# Patient Record
Sex: Female | Born: 1935
Health system: Southern US, Community
[De-identification: ages and names within clinical notes are randomized; demographics above are authoritative.]

## PROBLEM LIST (undated history)

## (undated) DIAGNOSIS — I10 Essential (primary) hypertension: Secondary | ICD-10-CM

## (undated) DIAGNOSIS — I4891 Unspecified atrial fibrillation: Secondary | ICD-10-CM

## (undated) DIAGNOSIS — E785 Hyperlipidemia, unspecified: Secondary | ICD-10-CM

## (undated) DIAGNOSIS — N183 Chronic kidney disease, stage 3 unspecified: Secondary | ICD-10-CM

## (undated) DIAGNOSIS — E119 Type 2 diabetes mellitus without complications: Secondary | ICD-10-CM

## (undated) DIAGNOSIS — M353 Polymyalgia rheumatica: Secondary | ICD-10-CM

## (undated) DIAGNOSIS — D6859 Other primary thrombophilia: Secondary | ICD-10-CM

## (undated) DIAGNOSIS — I509 Heart failure, unspecified: Secondary | ICD-10-CM

## (undated) DIAGNOSIS — N189 Chronic kidney disease, unspecified: Secondary | ICD-10-CM

## (undated) DIAGNOSIS — I429 Cardiomyopathy, unspecified: Secondary | ICD-10-CM

## (undated) HISTORY — DX: Essential (primary) hypertension: I10

## (undated) HISTORY — PX: CARDIAC CATHETERIZATION: SHX172

## (undated) HISTORY — PX: BREAST BIOPSY: SHX20

## (undated) HISTORY — DX: Heart failure, unspecified: I50.9

## (undated) HISTORY — DX: Hyperlipidemia, unspecified: E78.5

## (undated) HISTORY — DX: Polymyalgia rheumatica: M35.3

## (undated) HISTORY — DX: Type 2 diabetes mellitus without complications: E11.9

## (undated) HISTORY — DX: Unspecified atrial fibrillation: I48.91

## (undated) HISTORY — DX: Cardiomyopathy, unspecified: I42.9

## (undated) HISTORY — DX: Chronic kidney disease, stage 3 unspecified: N18.30

## (undated) HISTORY — DX: Other primary thrombophilia: D68.59

## (undated) HISTORY — PX: CATARACT EXTRACTION: SUR2

---
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
## 2015-11-23 DIAGNOSIS — I509 Heart failure, unspecified: Secondary | ICD-10-CM | POA: Diagnosis not present

## 2015-11-23 DIAGNOSIS — I2699 Other pulmonary embolism without acute cor pulmonale: Secondary | ICD-10-CM | POA: Diagnosis not present

## 2015-12-06 DIAGNOSIS — M170 Bilateral primary osteoarthritis of knee: Secondary | ICD-10-CM | POA: Diagnosis not present

## 2015-12-06 DIAGNOSIS — M19041 Primary osteoarthritis, right hand: Secondary | ICD-10-CM | POA: Diagnosis not present

## 2015-12-06 DIAGNOSIS — R0789 Other chest pain: Secondary | ICD-10-CM | POA: Diagnosis not present

## 2015-12-06 DIAGNOSIS — M353 Polymyalgia rheumatica: Secondary | ICD-10-CM | POA: Diagnosis not present

## 2015-12-18 DIAGNOSIS — R2689 Other abnormalities of gait and mobility: Secondary | ICD-10-CM | POA: Diagnosis not present

## 2015-12-18 DIAGNOSIS — M6281 Muscle weakness (generalized): Secondary | ICD-10-CM | POA: Diagnosis not present

## 2015-12-18 DIAGNOSIS — M25561 Pain in right knee: Secondary | ICD-10-CM | POA: Diagnosis not present

## 2015-12-20 DIAGNOSIS — R2689 Other abnormalities of gait and mobility: Secondary | ICD-10-CM | POA: Diagnosis not present

## 2015-12-20 DIAGNOSIS — M6281 Muscle weakness (generalized): Secondary | ICD-10-CM | POA: Diagnosis not present

## 2015-12-20 DIAGNOSIS — E119 Type 2 diabetes mellitus without complications: Secondary | ICD-10-CM | POA: Diagnosis not present

## 2015-12-20 DIAGNOSIS — M25561 Pain in right knee: Secondary | ICD-10-CM | POA: Diagnosis not present

## 2015-12-20 DIAGNOSIS — Z961 Presence of intraocular lens: Secondary | ICD-10-CM | POA: Diagnosis not present

## 2015-12-21 DIAGNOSIS — I509 Heart failure, unspecified: Secondary | ICD-10-CM | POA: Diagnosis not present

## 2015-12-21 DIAGNOSIS — I2699 Other pulmonary embolism without acute cor pulmonale: Secondary | ICD-10-CM | POA: Diagnosis not present

## 2015-12-21 DIAGNOSIS — Z6836 Body mass index (BMI) 36.0-36.9, adult: Secondary | ICD-10-CM | POA: Diagnosis not present

## 2015-12-21 DIAGNOSIS — E1165 Type 2 diabetes mellitus with hyperglycemia: Secondary | ICD-10-CM | POA: Diagnosis not present

## 2015-12-21 DIAGNOSIS — Z713 Dietary counseling and surveillance: Secondary | ICD-10-CM | POA: Diagnosis not present

## 2015-12-24 DIAGNOSIS — R2689 Other abnormalities of gait and mobility: Secondary | ICD-10-CM | POA: Diagnosis not present

## 2015-12-24 DIAGNOSIS — M6281 Muscle weakness (generalized): Secondary | ICD-10-CM | POA: Diagnosis not present

## 2015-12-24 DIAGNOSIS — M25561 Pain in right knee: Secondary | ICD-10-CM | POA: Diagnosis not present

## 2015-12-25 DIAGNOSIS — E2839 Other primary ovarian failure: Secondary | ICD-10-CM | POA: Diagnosis not present

## 2015-12-26 DIAGNOSIS — M6281 Muscle weakness (generalized): Secondary | ICD-10-CM | POA: Diagnosis not present

## 2015-12-26 DIAGNOSIS — M25561 Pain in right knee: Secondary | ICD-10-CM | POA: Diagnosis not present

## 2015-12-26 DIAGNOSIS — R2689 Other abnormalities of gait and mobility: Secondary | ICD-10-CM | POA: Diagnosis not present

## 2015-12-27 DIAGNOSIS — M6281 Muscle weakness (generalized): Secondary | ICD-10-CM | POA: Diagnosis not present

## 2015-12-27 DIAGNOSIS — R2689 Other abnormalities of gait and mobility: Secondary | ICD-10-CM | POA: Diagnosis not present

## 2015-12-27 DIAGNOSIS — M25561 Pain in right knee: Secondary | ICD-10-CM | POA: Diagnosis not present

## 2016-01-01 DIAGNOSIS — M6281 Muscle weakness (generalized): Secondary | ICD-10-CM | POA: Diagnosis not present

## 2016-01-01 DIAGNOSIS — R2689 Other abnormalities of gait and mobility: Secondary | ICD-10-CM | POA: Diagnosis not present

## 2016-01-01 DIAGNOSIS — M25561 Pain in right knee: Secondary | ICD-10-CM | POA: Diagnosis not present

## 2016-01-03 DIAGNOSIS — M6281 Muscle weakness (generalized): Secondary | ICD-10-CM | POA: Diagnosis not present

## 2016-01-03 DIAGNOSIS — M25561 Pain in right knee: Secondary | ICD-10-CM | POA: Diagnosis not present

## 2016-01-03 DIAGNOSIS — R2689 Other abnormalities of gait and mobility: Secondary | ICD-10-CM | POA: Diagnosis not present

## 2016-01-04 DIAGNOSIS — M6281 Muscle weakness (generalized): Secondary | ICD-10-CM | POA: Diagnosis not present

## 2016-01-04 DIAGNOSIS — M25561 Pain in right knee: Secondary | ICD-10-CM | POA: Diagnosis not present

## 2016-01-04 DIAGNOSIS — R2689 Other abnormalities of gait and mobility: Secondary | ICD-10-CM | POA: Diagnosis not present

## 2016-01-07 DIAGNOSIS — M6281 Muscle weakness (generalized): Secondary | ICD-10-CM | POA: Diagnosis not present

## 2016-01-07 DIAGNOSIS — M25561 Pain in right knee: Secondary | ICD-10-CM | POA: Diagnosis not present

## 2016-01-07 DIAGNOSIS — R2689 Other abnormalities of gait and mobility: Secondary | ICD-10-CM | POA: Diagnosis not present

## 2016-01-09 DIAGNOSIS — M6281 Muscle weakness (generalized): Secondary | ICD-10-CM | POA: Diagnosis not present

## 2016-01-09 DIAGNOSIS — R2689 Other abnormalities of gait and mobility: Secondary | ICD-10-CM | POA: Diagnosis not present

## 2016-01-09 DIAGNOSIS — M25561 Pain in right knee: Secondary | ICD-10-CM | POA: Diagnosis not present

## 2016-01-11 DIAGNOSIS — M6281 Muscle weakness (generalized): Secondary | ICD-10-CM | POA: Diagnosis not present

## 2016-01-11 DIAGNOSIS — M25561 Pain in right knee: Secondary | ICD-10-CM | POA: Diagnosis not present

## 2016-01-11 DIAGNOSIS — R2689 Other abnormalities of gait and mobility: Secondary | ICD-10-CM | POA: Diagnosis not present

## 2016-01-16 DIAGNOSIS — M25561 Pain in right knee: Secondary | ICD-10-CM | POA: Diagnosis not present

## 2016-01-16 DIAGNOSIS — M6281 Muscle weakness (generalized): Secondary | ICD-10-CM | POA: Diagnosis not present

## 2016-01-16 DIAGNOSIS — R2689 Other abnormalities of gait and mobility: Secondary | ICD-10-CM | POA: Diagnosis not present

## 2016-01-17 DIAGNOSIS — R2689 Other abnormalities of gait and mobility: Secondary | ICD-10-CM | POA: Diagnosis not present

## 2016-01-17 DIAGNOSIS — M25561 Pain in right knee: Secondary | ICD-10-CM | POA: Diagnosis not present

## 2016-01-17 DIAGNOSIS — M6281 Muscle weakness (generalized): Secondary | ICD-10-CM | POA: Diagnosis not present

## 2016-01-18 DIAGNOSIS — M6281 Muscle weakness (generalized): Secondary | ICD-10-CM | POA: Diagnosis not present

## 2016-01-18 DIAGNOSIS — R2689 Other abnormalities of gait and mobility: Secondary | ICD-10-CM | POA: Diagnosis not present

## 2016-01-18 DIAGNOSIS — M25561 Pain in right knee: Secondary | ICD-10-CM | POA: Diagnosis not present

## 2016-01-21 DIAGNOSIS — M6281 Muscle weakness (generalized): Secondary | ICD-10-CM | POA: Diagnosis not present

## 2016-01-21 DIAGNOSIS — M25561 Pain in right knee: Secondary | ICD-10-CM | POA: Diagnosis not present

## 2016-01-21 DIAGNOSIS — R2689 Other abnormalities of gait and mobility: Secondary | ICD-10-CM | POA: Diagnosis not present

## 2016-01-22 DIAGNOSIS — E1165 Type 2 diabetes mellitus with hyperglycemia: Secondary | ICD-10-CM | POA: Diagnosis not present

## 2016-01-22 DIAGNOSIS — I10 Essential (primary) hypertension: Secondary | ICD-10-CM | POA: Diagnosis not present

## 2016-01-22 DIAGNOSIS — I509 Heart failure, unspecified: Secondary | ICD-10-CM | POA: Diagnosis not present

## 2016-01-22 DIAGNOSIS — I2699 Other pulmonary embolism without acute cor pulmonale: Secondary | ICD-10-CM | POA: Diagnosis not present

## 2016-01-23 DIAGNOSIS — M25561 Pain in right knee: Secondary | ICD-10-CM | POA: Diagnosis not present

## 2016-01-23 DIAGNOSIS — M6281 Muscle weakness (generalized): Secondary | ICD-10-CM | POA: Diagnosis not present

## 2016-01-23 DIAGNOSIS — R2689 Other abnormalities of gait and mobility: Secondary | ICD-10-CM | POA: Diagnosis not present

## 2016-01-25 DIAGNOSIS — R2689 Other abnormalities of gait and mobility: Secondary | ICD-10-CM | POA: Diagnosis not present

## 2016-01-25 DIAGNOSIS — M25561 Pain in right knee: Secondary | ICD-10-CM | POA: Diagnosis not present

## 2016-01-25 DIAGNOSIS — M6281 Muscle weakness (generalized): Secondary | ICD-10-CM | POA: Diagnosis not present

## 2016-01-29 DIAGNOSIS — M6281 Muscle weakness (generalized): Secondary | ICD-10-CM | POA: Diagnosis not present

## 2016-01-29 DIAGNOSIS — R2689 Other abnormalities of gait and mobility: Secondary | ICD-10-CM | POA: Diagnosis not present

## 2016-01-29 DIAGNOSIS — M25561 Pain in right knee: Secondary | ICD-10-CM | POA: Diagnosis not present

## 2016-01-31 DIAGNOSIS — R2689 Other abnormalities of gait and mobility: Secondary | ICD-10-CM | POA: Diagnosis not present

## 2016-01-31 DIAGNOSIS — M25561 Pain in right knee: Secondary | ICD-10-CM | POA: Diagnosis not present

## 2016-01-31 DIAGNOSIS — M6281 Muscle weakness (generalized): Secondary | ICD-10-CM | POA: Diagnosis not present

## 2016-02-06 DIAGNOSIS — M6281 Muscle weakness (generalized): Secondary | ICD-10-CM | POA: Diagnosis not present

## 2016-02-06 DIAGNOSIS — M25561 Pain in right knee: Secondary | ICD-10-CM | POA: Diagnosis not present

## 2016-02-06 DIAGNOSIS — R2689 Other abnormalities of gait and mobility: Secondary | ICD-10-CM | POA: Diagnosis not present

## 2016-02-07 DIAGNOSIS — M6281 Muscle weakness (generalized): Secondary | ICD-10-CM | POA: Diagnosis not present

## 2016-02-07 DIAGNOSIS — M25561 Pain in right knee: Secondary | ICD-10-CM | POA: Diagnosis not present

## 2016-02-07 DIAGNOSIS — R2689 Other abnormalities of gait and mobility: Secondary | ICD-10-CM | POA: Diagnosis not present

## 2016-02-12 DIAGNOSIS — R2689 Other abnormalities of gait and mobility: Secondary | ICD-10-CM | POA: Diagnosis not present

## 2016-02-12 DIAGNOSIS — M6281 Muscle weakness (generalized): Secondary | ICD-10-CM | POA: Diagnosis not present

## 2016-02-12 DIAGNOSIS — M25561 Pain in right knee: Secondary | ICD-10-CM | POA: Diagnosis not present

## 2016-02-14 DIAGNOSIS — R2689 Other abnormalities of gait and mobility: Secondary | ICD-10-CM | POA: Diagnosis not present

## 2016-02-14 DIAGNOSIS — M6281 Muscle weakness (generalized): Secondary | ICD-10-CM | POA: Diagnosis not present

## 2016-02-14 DIAGNOSIS — M25561 Pain in right knee: Secondary | ICD-10-CM | POA: Diagnosis not present

## 2016-02-19 DIAGNOSIS — M25561 Pain in right knee: Secondary | ICD-10-CM | POA: Diagnosis not present

## 2016-02-19 DIAGNOSIS — I2699 Other pulmonary embolism without acute cor pulmonale: Secondary | ICD-10-CM | POA: Diagnosis not present

## 2016-02-19 DIAGNOSIS — I509 Heart failure, unspecified: Secondary | ICD-10-CM | POA: Diagnosis not present

## 2016-02-19 DIAGNOSIS — R2689 Other abnormalities of gait and mobility: Secondary | ICD-10-CM | POA: Diagnosis not present

## 2016-02-19 DIAGNOSIS — M6281 Muscle weakness (generalized): Secondary | ICD-10-CM | POA: Diagnosis not present

## 2016-02-21 DIAGNOSIS — R2689 Other abnormalities of gait and mobility: Secondary | ICD-10-CM | POA: Diagnosis not present

## 2016-02-21 DIAGNOSIS — M6281 Muscle weakness (generalized): Secondary | ICD-10-CM | POA: Diagnosis not present

## 2016-02-21 DIAGNOSIS — M25561 Pain in right knee: Secondary | ICD-10-CM | POA: Diagnosis not present

## 2016-02-26 DIAGNOSIS — I340 Nonrheumatic mitral (valve) insufficiency: Secondary | ICD-10-CM | POA: Diagnosis not present

## 2016-02-26 DIAGNOSIS — I5022 Chronic systolic (congestive) heart failure: Secondary | ICD-10-CM | POA: Diagnosis not present

## 2016-02-26 DIAGNOSIS — I482 Chronic atrial fibrillation: Secondary | ICD-10-CM | POA: Diagnosis not present

## 2016-02-27 DIAGNOSIS — M6281 Muscle weakness (generalized): Secondary | ICD-10-CM | POA: Diagnosis not present

## 2016-02-27 DIAGNOSIS — M25561 Pain in right knee: Secondary | ICD-10-CM | POA: Diagnosis not present

## 2016-02-27 DIAGNOSIS — R2689 Other abnormalities of gait and mobility: Secondary | ICD-10-CM | POA: Diagnosis not present

## 2016-02-28 DIAGNOSIS — M25561 Pain in right knee: Secondary | ICD-10-CM | POA: Diagnosis not present

## 2016-02-28 DIAGNOSIS — M6281 Muscle weakness (generalized): Secondary | ICD-10-CM | POA: Diagnosis not present

## 2016-02-28 DIAGNOSIS — R2689 Other abnormalities of gait and mobility: Secondary | ICD-10-CM | POA: Diagnosis not present

## 2016-03-05 DIAGNOSIS — R2689 Other abnormalities of gait and mobility: Secondary | ICD-10-CM | POA: Diagnosis not present

## 2016-03-05 DIAGNOSIS — M6281 Muscle weakness (generalized): Secondary | ICD-10-CM | POA: Diagnosis not present

## 2016-03-05 DIAGNOSIS — M25561 Pain in right knee: Secondary | ICD-10-CM | POA: Diagnosis not present

## 2016-03-06 DIAGNOSIS — R2689 Other abnormalities of gait and mobility: Secondary | ICD-10-CM | POA: Diagnosis not present

## 2016-03-06 DIAGNOSIS — M25561 Pain in right knee: Secondary | ICD-10-CM | POA: Diagnosis not present

## 2016-03-06 DIAGNOSIS — M6281 Muscle weakness (generalized): Secondary | ICD-10-CM | POA: Diagnosis not present

## 2016-03-12 DIAGNOSIS — M25561 Pain in right knee: Secondary | ICD-10-CM | POA: Diagnosis not present

## 2016-03-12 DIAGNOSIS — M170 Bilateral primary osteoarthritis of knee: Secondary | ICD-10-CM | POA: Diagnosis not present

## 2016-03-12 DIAGNOSIS — M6281 Muscle weakness (generalized): Secondary | ICD-10-CM | POA: Diagnosis not present

## 2016-03-12 DIAGNOSIS — R2689 Other abnormalities of gait and mobility: Secondary | ICD-10-CM | POA: Diagnosis not present

## 2016-04-01 DIAGNOSIS — I2699 Other pulmonary embolism without acute cor pulmonale: Secondary | ICD-10-CM | POA: Diagnosis not present

## 2016-04-02 DIAGNOSIS — M170 Bilateral primary osteoarthritis of knee: Secondary | ICD-10-CM | POA: Diagnosis not present

## 2016-04-09 DIAGNOSIS — M170 Bilateral primary osteoarthritis of knee: Secondary | ICD-10-CM | POA: Diagnosis not present

## 2016-04-16 DIAGNOSIS — M170 Bilateral primary osteoarthritis of knee: Secondary | ICD-10-CM | POA: Diagnosis not present

## 2016-04-22 DIAGNOSIS — E119 Type 2 diabetes mellitus without complications: Secondary | ICD-10-CM | POA: Diagnosis not present

## 2016-04-22 DIAGNOSIS — I10 Essential (primary) hypertension: Secondary | ICD-10-CM | POA: Diagnosis not present

## 2016-04-22 DIAGNOSIS — E7800 Pure hypercholesterolemia, unspecified: Secondary | ICD-10-CM | POA: Diagnosis not present

## 2016-04-22 DIAGNOSIS — I509 Heart failure, unspecified: Secondary | ICD-10-CM | POA: Diagnosis not present

## 2016-04-23 DIAGNOSIS — M170 Bilateral primary osteoarthritis of knee: Secondary | ICD-10-CM | POA: Diagnosis not present

## 2016-04-29 DIAGNOSIS — E1165 Type 2 diabetes mellitus with hyperglycemia: Secondary | ICD-10-CM | POA: Diagnosis not present

## 2016-04-29 DIAGNOSIS — I2699 Other pulmonary embolism without acute cor pulmonale: Secondary | ICD-10-CM | POA: Diagnosis not present

## 2016-04-29 DIAGNOSIS — I509 Heart failure, unspecified: Secondary | ICD-10-CM | POA: Diagnosis not present

## 2016-05-12 DIAGNOSIS — I420 Dilated cardiomyopathy: Secondary | ICD-10-CM | POA: Diagnosis not present

## 2016-05-27 DIAGNOSIS — I255 Ischemic cardiomyopathy: Secondary | ICD-10-CM | POA: Diagnosis not present

## 2016-05-27 DIAGNOSIS — Z418 Encounter for other procedures for purposes other than remedying health state: Secondary | ICD-10-CM | POA: Diagnosis not present

## 2016-05-27 DIAGNOSIS — I509 Heart failure, unspecified: Secondary | ICD-10-CM | POA: Diagnosis not present

## 2016-05-27 DIAGNOSIS — I2699 Other pulmonary embolism without acute cor pulmonale: Secondary | ICD-10-CM | POA: Diagnosis not present

## 2016-05-29 ENCOUNTER — Encounter: Payer: Self-pay | Admitting: *Deleted

## 2016-05-29 ENCOUNTER — Other Ambulatory Visit: Payer: Self-pay | Admitting: *Deleted

## 2016-05-30 ENCOUNTER — Encounter: Payer: Self-pay | Admitting: Cardiovascular Disease

## 2016-05-30 ENCOUNTER — Ambulatory Visit (INDEPENDENT_AMBULATORY_CARE_PROVIDER_SITE_OTHER): Payer: Medicare Other | Admitting: Cardiovascular Disease

## 2016-05-30 ENCOUNTER — Encounter: Payer: Self-pay | Admitting: *Deleted

## 2016-05-30 VITALS — BP 142/83 | HR 64 | Ht 60.0 in | Wt 194.0 lb

## 2016-05-30 DIAGNOSIS — I10 Essential (primary) hypertension: Secondary | ICD-10-CM | POA: Diagnosis not present

## 2016-05-30 DIAGNOSIS — Z136 Encounter for screening for cardiovascular disorders: Secondary | ICD-10-CM

## 2016-05-30 DIAGNOSIS — I5022 Chronic systolic (congestive) heart failure: Secondary | ICD-10-CM | POA: Diagnosis not present

## 2016-05-30 DIAGNOSIS — I482 Chronic atrial fibrillation, unspecified: Secondary | ICD-10-CM

## 2016-05-30 DIAGNOSIS — I429 Cardiomyopathy, unspecified: Secondary | ICD-10-CM | POA: Diagnosis not present

## 2016-05-30 DIAGNOSIS — Z86711 Personal history of pulmonary embolism: Secondary | ICD-10-CM

## 2016-05-30 DIAGNOSIS — D6859 Other primary thrombophilia: Secondary | ICD-10-CM

## 2016-05-30 NOTE — Addendum Note (Signed)
Addended by: Lesle Chris on: 05/30/2016 02:16 PM   Modules accepted: Orders

## 2016-05-30 NOTE — Patient Instructions (Signed)
Medication Instructions:   Continue all current medications.  Labwork:  BMET - order given today.  Can do sometime next week.  Office will contact with results via phone or letter.    Testing/Procedures:  Your physician has requested that you have a lexiscan myoview. For further information please visit [email or web address] Please follow instruction sheet, as given.  Office will contact with results via phone or letter.    Follow-Up:  2 months   Any Other Special Instructions Will Be Listed Below (If Applicable).  If you need a refill on your cardiac medications before your next appointment, please call your pharmacy.

## 2016-05-30 NOTE — Progress Notes (Signed)
Patient ID: Rebecca Hanson, female   DOB: [birth year], 82 y.o.   MRN: [id number]       CARDIOLOGY CONSULT NOTE  Patient ID: Rebecca Hanson MRN: [id number] DOB/AGE: Feb 11, 1932 82 y.o.  Admit date: (Not on file) Primary Physician: Ignatius Specking, MD Referring Physician:   Reason for Consultation: a fib, CHF, MR  HPI: The patient is an 82 year old woman with a history of chronic systolic congestive heart failure, chronic atrial fibrillation, and mitral regurgitation. She was previously seen by [facility] on 02/26/16. Echocardiogram performed on 05/14/16 reportedly demonstrated severely reduced left ventricular systolic function, LVEF 30-35%, mild LVH, mild mitral and tricuspid regurgitation, moderate left atrial and mild right atrial enlargement, and mildly elevated pulmonary pressures.  ECG performed in the office today which I personally interpreted demonstrates atrial fibrillation with controlled heart rate and a nonspecific T wave abnormality.  She denies a history of myocardial infarction. She has a history of protein S deficiency and pulmonary embolism. She has venous varicosities. She denies chest pain but does have exertional dyspnea. She denies ever having had a stress test. She says her blood pressure normally runs 120/70, and is 142/83 today.   Allergies  Allergen Reactions  . Benadryl [Diphenhydramine Hcl (Sleep)] Other (See Comments)    "Makes me wild acting"  . Penicillins     rash  . Streptomycin Sulfate [Streptomycin]     rash    Current Outpatient Prescriptions  Medication Sig Dispense Refill  . calcium carbonate (OS-CAL) 600 MG TABS tablet Take 1 tablet by mouth 2 (two) times daily.    . carvedilol (COREG) 12.5 MG tablet Take 1 tablet by mouth 2 (two) times daily.    . cetirizine (ZYRTEC) 10 MG tablet Take 1 tablet by mouth daily.    Marland Kitchen COUMADIN 2.5 MG tablet Take 2 tablets by mouth as directed.  5  . docusate sodium (COLACE) 100 MG capsule Take 1 capsule  by mouth 3 (three) times daily as needed.    . furosemide (LASIX) 40 MG tablet Take 1 tablet by mouth daily.    Marland Kitchen glimepiride (AMARYL) 2 MG tablet Take 1 tablet by mouth daily with breakfast.     . lisinopril (PRINIVIL,ZESTRIL) 10 MG tablet Take 1 tablet by mouth daily.    . metFORMIN (GLUCOPHAGE) 500 MG tablet Take 1,000 mg by mouth 2 (two) times daily.     . Multiple Vitamins-Minerals (MULTIVITAMIN & MINERAL PO) Take 1 tablet by mouth daily.    . potassium chloride (K-DUR) 10 MEQ tablet Take 1 tablet by mouth daily.    Marland Kitchen spironolactone (ALDACTONE) 25 MG tablet Take 0.5 tablets by mouth daily.     No current facility-administered medications for this visit.    Past Medical History  Diagnosis Date  . Diabetes mellitus without complication (HCC)   . CHF (congestive heart failure) (HCC)   . Dyslipidemia     Past Surgical History  Procedure Laterality Date  . Cataract extraction    . Breast biopsy      Social History   Social History  . Marital Status: Widowed    Spouse Name: N/A  . Number of Children: N/A  . Years of Education: N/A   Occupational History  . Not on file.   Social History Main Topics  . Smoking status: Never Smoker   . Smokeless tobacco: Never Used  . Alcohol Use: Not on file  . Drug Use: Not on file  . Sexual Activity: Not on file   Other Topics  Concern  . Not on file   Social History Narrative     No family history of premature CAD in 1st degree relatives.  Prior to Admission medications   Medication Sig Start Date End Date Taking? Authorizing Provider  calcium carbonate (OS-CAL) 600 MG TABS tablet Take 1 tablet by mouth 2 (two) times daily.   Yes Historical Provider, MD  carvedilol (COREG) 12.5 MG tablet Take 1 tablet by mouth 2 (two) times daily. 02/05/15  Yes Historical Provider, MD  cetirizine (ZYRTEC) 10 MG tablet Take 1 tablet by mouth daily.   Yes Historical Provider, MD  COUMADIN 2.5 MG tablet Take 2 tablets by mouth as directed. 04/26/16   Yes Historical Provider, MD  docusate sodium (COLACE) 100 MG capsule Take 1 capsule by mouth 3 (three) times daily as needed.   Yes Historical Provider, MD  furosemide (LASIX) 40 MG tablet Take 1 tablet by mouth daily. 05/24/16  Yes Historical Provider, MD  glimepiride (AMARYL) 2 MG tablet Take 1 tablet by mouth daily with breakfast.  01/29/15  Yes Historical Provider, MD  lisinopril (PRINIVIL,ZESTRIL) 10 MG tablet Take 1 tablet by mouth daily. 02/05/15  Yes Historical Provider, MD  metFORMIN (GLUCOPHAGE) 500 MG tablet Take 1,000 mg by mouth 2 (two) times daily.  04/28/15  Yes Historical Provider, MD  Multiple Vitamins-Minerals (MULTIVITAMIN & MINERAL PO) Take 1 tablet by mouth daily.   Yes Historical Provider, MD  potassium chloride (K-DUR) 10 MEQ tablet Take 1 tablet by mouth daily. 05/24/16  Yes Historical Provider, MD  spironolactone (ALDACTONE) 25 MG tablet Take 0.5 tablets by mouth daily. 02/01/15  Yes Historical Provider, MD     Review of systems complete and found to be negative unless listed above in HPI     Physical exam Blood pressure 142/83, pulse 64, height 5' (1.524 m), weight 194 lb (87.998 kg), SpO2 98 %. General: NAD Neck: No JVD, no thyromegaly or thyroid nodule.  Lungs: Clear to auscultation bilaterally with normal respiratory effort. CV: Nondisplaced PMI. Regular rate and irregular rhythm, normal S1/S2, no S3, no murmur.  1+ pitting pretibial and periankle edema.  Wearing right knee brace. No carotid bruit. Abdomen: Soft, nontender,obese. Skin: Intact without lesions or rashes.  Neurologic: Alert and oriented x 3.  Psych: Normal affect. Extremities: No clubbing or cyanosis.  HEENT: Normal.   ECG: Most recent ECG reviewed.  Labs:  No results found for: WBC, HGB, HCT, MCV, PLT No results for input(s): NA, K, CL, CO2, BUN, CREATININE, CALCIUM, PROT, BILITOT, ALKPHOS, ALT, AST, GLUCOSE in the last 168 hours.  Invalid input(s): LABALBU No results found for: CKTOTAL, CKMB,  CKMBINDEX, TROPONINI No results found for: CHOL No results found for: HDL No results found for: LDLCALC No results found for: TRIG No results found for: CHOLHDL No results found for: LDLDIRECT       Studies: No results found.  ASSESSMENT AND PLAN:  1. Chronic systolic heart failure: Currently on carvedilol 12.5 mg twice daily, Lasix 40 mg daily, lisinopril 10 mg daily, and spironolactone 12.5 mg daily. I will proceed with a nuclear myocardial perfusion imaging study to evaluate for ischemic heart disease (Lexiscan). Will check BMET as I may switch ACEI to Entresto.  2. Chronic atrial fibrillation: Anticoagulated with warfarin. HR controlled with Coreg.  3. Essential HTN: Mildly elevated. Will monitor.   4. Pulmonary embolism/protein S deficiency: Continue warfarin.  Dispo: fu 2 months.   Signed: Prentice Docker, M.D., F.A.C.C.  05/30/2016, 1:49 PM

## 2016-06-04 DIAGNOSIS — I5022 Chronic systolic (congestive) heart failure: Secondary | ICD-10-CM | POA: Diagnosis not present

## 2016-06-05 DIAGNOSIS — M353 Polymyalgia rheumatica: Secondary | ICD-10-CM | POA: Diagnosis not present

## 2016-06-05 DIAGNOSIS — M174 Other bilateral secondary osteoarthritis of knee: Secondary | ICD-10-CM | POA: Diagnosis not present

## 2016-06-05 DIAGNOSIS — M19241 Secondary osteoarthritis, right hand: Secondary | ICD-10-CM | POA: Diagnosis not present

## 2016-06-10 ENCOUNTER — Telehealth: Payer: Self-pay | Admitting: *Deleted

## 2016-06-10 NOTE — Telephone Encounter (Signed)
-----   Message from Suresh A Koneswaran, MD sent at 06/10/2016  1:57 PM EDT ----- Ok. 

## 2016-06-10 NOTE — Telephone Encounter (Signed)
Called patient with test results. No answer. Left message to call back.  

## 2016-06-11 ENCOUNTER — Ambulatory Visit (HOSPITAL_COMMUNITY): Admission: RE | Admit: 2016-06-11 | Payer: Medicare Other | Source: Ambulatory Visit

## 2016-06-11 ENCOUNTER — Telehealth: Payer: Self-pay | Admitting: *Deleted

## 2016-06-11 ENCOUNTER — Encounter (HOSPITAL_COMMUNITY)
Admission: RE | Admit: 2016-06-11 | Discharge: 2016-06-11 | Disposition: A | Discharge status: Home / Self Care | Payer: Medicare Other | Source: Ambulatory Visit | Attending: Cardiovascular Disease | Admitting: Cardiovascular Disease

## 2016-06-11 ENCOUNTER — Encounter (HOSPITAL_COMMUNITY): Payer: Self-pay

## 2016-06-11 DIAGNOSIS — R931 Abnormal findings on diagnostic imaging of heart and coronary circulation: Secondary | ICD-10-CM | POA: Diagnosis not present

## 2016-06-11 DIAGNOSIS — I4891 Unspecified atrial fibrillation: Secondary | ICD-10-CM | POA: Insufficient documentation

## 2016-06-11 DIAGNOSIS — I429 Cardiomyopathy, unspecified: Secondary | ICD-10-CM | POA: Diagnosis not present

## 2016-06-11 DIAGNOSIS — I510 Cardiac septal defect, acquired: Secondary | ICD-10-CM | POA: Diagnosis not present

## 2016-06-11 LAB — NM MYOCAR MULTI W/SPECT W/WALL MOTION / EF
CHL CUP NUCLEAR SRS: 1
CHL CUP NUCLEAR SSS: 2
CHL CUP RESTING HR STRESS: 94 {beats}/min
CSEPHR: 79 %
LV dias vol: 98 mL (ref 46–106)
LVSYSVOL: 69 mL
MPHR: 138 {beats}/min
NUC STRESS TID: 1.04
Peak HR: 110 {beats}/min
RATE: 0.31
SDS: 1

## 2016-06-11 MED ORDER — TECHNETIUM TC 99M TETROFOSMIN IV KIT
30.0000 | PACK | Freq: Once | INTRAVENOUS | Status: AC | PRN
  Administered 2016-06-11: 30 via INTRAVENOUS

## 2016-06-11 MED ORDER — REGADENOSON 0.4 MG/5ML IV SOLN
INTRAVENOUS | Status: AC
  Administered 2016-06-11: 0.4 mg
  Filled 2016-06-11: qty 5

## 2016-06-11 MED ORDER — TECHNETIUM TC 99M TETROFOSMIN IV KIT
10.0000 | PACK | Freq: Once | INTRAVENOUS | Status: AC | PRN
  Administered 2016-06-11: 11 via INTRAVENOUS

## 2016-06-11 NOTE — Telephone Encounter (Signed)
Called patient with test results. No answer. Left message to call back.  

## 2016-06-11 NOTE — Telephone Encounter (Signed)
-----   Message from Laqueta Linden, MD sent at 06/10/2016  1:57 PM EDT ----- [place].

## 2016-06-17 ENCOUNTER — Telehealth: Payer: Self-pay | Admitting: *Deleted

## 2016-06-17 NOTE — Telephone Encounter (Signed)
-----   Message from Laqueta Linden, MD sent at 06/12/2016  8:37 AM EDT ----- Continued medical therapy.

## 2016-06-17 NOTE — Telephone Encounter (Signed)
Notes Recorded by Lesle Chris, LPN on 02/15/1858 at 3:36 PM EDT Left message to return call.

## 2016-06-18 NOTE — Telephone Encounter (Signed)
Notes Recorded by Lesle Chris, LPN on 12/17/5168 at 10:26 AM EDT Patient notified. Copy to pmd. Follow up scheduled for September.

## 2016-06-24 DIAGNOSIS — I2699 Other pulmonary embolism without acute cor pulmonale: Secondary | ICD-10-CM | POA: Diagnosis not present

## 2016-06-24 DIAGNOSIS — E1165 Type 2 diabetes mellitus with hyperglycemia: Secondary | ICD-10-CM | POA: Diagnosis not present

## 2016-06-24 DIAGNOSIS — Z299 Encounter for prophylactic measures, unspecified: Secondary | ICD-10-CM | POA: Diagnosis not present

## 2016-06-24 DIAGNOSIS — I509 Heart failure, unspecified: Secondary | ICD-10-CM | POA: Diagnosis not present

## 2016-07-25 ENCOUNTER — Encounter: Payer: Self-pay | Admitting: Cardiovascular Disease

## 2016-07-25 ENCOUNTER — Ambulatory Visit (INDEPENDENT_AMBULATORY_CARE_PROVIDER_SITE_OTHER): Payer: Medicare Other | Admitting: Cardiovascular Disease

## 2016-07-25 VITALS — BP 134/88 | HR 82 | Ht 60.0 in | Wt 194.8 lb

## 2016-07-25 DIAGNOSIS — I5022 Chronic systolic (congestive) heart failure: Secondary | ICD-10-CM | POA: Diagnosis not present

## 2016-07-25 DIAGNOSIS — I10 Essential (primary) hypertension: Secondary | ICD-10-CM

## 2016-07-25 DIAGNOSIS — Z23 Encounter for immunization: Secondary | ICD-10-CM | POA: Diagnosis not present

## 2016-07-25 DIAGNOSIS — I429 Cardiomyopathy, unspecified: Secondary | ICD-10-CM | POA: Diagnosis not present

## 2016-07-25 DIAGNOSIS — Z86711 Personal history of pulmonary embolism: Secondary | ICD-10-CM

## 2016-07-25 DIAGNOSIS — I482 Chronic atrial fibrillation, unspecified: Secondary | ICD-10-CM

## 2016-07-25 DIAGNOSIS — D6859 Other primary thrombophilia: Secondary | ICD-10-CM | POA: Diagnosis not present

## 2016-07-25 MED ORDER — SACUBITRIL-VALSARTAN 24-26 MG PO TABS: 1.0000 | ORAL_TABLET | Freq: Two times a day (BID) | ORAL | 6 refills | Status: DC

## 2016-07-25 MED ORDER — SACUBITRIL-VALSARTAN 24-26 MG PO TABS: 1.0000 | ORAL_TABLET | Freq: Two times a day (BID) | ORAL | 0 refills | Status: DC

## 2016-07-25 NOTE — Patient Instructions (Signed)
Medication Instructions:   Stop Lisinopril.  Begin Entresto 24/26mg  twice a day - samples, printed scripts, & 1 month free trial offer given today. - PLEASE BEGIN THIS ON Sunday, July 27, 2016.    Continue all other medications.    Labwork: none  Testing/Procedures: none  Follow-Up: 4 months   Any Other Special Instructions Will Be Listed Below (If Applicable).  If you need a refill on your cardiac medications before your next appointment, please call your pharmacy.

## 2016-07-25 NOTE — Progress Notes (Signed)
SUBJECTIVE: The patient is an 82 year old woman with a history of chronic systolic congestive heart failure, chronic atrial fibrillation, and mitral regurgitation.   Echocardiogram performed on 05/14/16 reportedly demonstrated severely reduced left ventricular systolic function, LVEF 30-35%, mild LVH, mild mitral and tricuspid regurgitation, moderate left atrial and mild right atrial enlargement, and mildly elevated pulmonary pressures.  She denies a history of myocardial infarction. She has a history of protein S deficiency and pulmonary embolism. She has venous varicosities. She denies chest pain but does have exertional dyspnea which is stable.   Nuclear stress test 06/11/16 did not show any large ischemic territories. There was a small, mild intensity, apical anterior defect that was partially reversible and thought related to attenuation artifact versus a minor region of ischemia.  Labs 06/04/16 BUN 26, creatinine 1.16.   Review of Systems: As per "subjective", otherwise negative.  Allergies  Allergen Reactions  . Benadryl [Diphenhydramine Hcl (Sleep)] Other (See Comments)    "Makes me wild acting"  . Penicillins     rash  . Streptomycin Sulfate [Streptomycin]     rash    Current Outpatient Prescriptions  Medication Sig Dispense Refill  . calcium carbonate (OS-CAL) 600 MG TABS tablet Take 1 tablet by mouth 2 (two) times daily.    . carvedilol (COREG) 12.5 MG tablet Take 1 tablet by mouth 2 (two) times daily.    . cetirizine (ZYRTEC) 10 MG tablet Take 1 tablet by mouth daily.    Marland Kitchen COUMADIN 2.5 MG tablet Take 2 tablets by mouth as directed.  5  . docusate sodium (COLACE) 100 MG capsule Take 1 capsule by mouth 3 (three) times daily as needed.    . furosemide (LASIX) 40 MG tablet Take 1 tablet by mouth daily.    Marland Kitchen glimepiride (AMARYL) 2 MG tablet Take 1 tablet by mouth daily with breakfast.     . lisinopril (PRINIVIL,ZESTRIL) 10 MG tablet Take 1 tablet by mouth daily.    .  metFORMIN (GLUCOPHAGE) 500 MG tablet Take 1,000 mg by mouth 2 (two) times daily.     . Multiple Vitamins-Minerals (MULTIVITAMIN & MINERAL PO) Take 1 tablet by mouth daily.    . potassium chloride (K-DUR) 10 MEQ tablet Take 1 tablet by mouth daily.    Marland Kitchen spironolactone (ALDACTONE) 25 MG tablet Take 0.5 tablets by mouth daily.    . potassium chloride (K-DUR,KLOR-CON) 10 MEQ tablet Take 10 mEq by mouth daily.     No current facility-administered medications for this visit.     Past Medical History:  Diagnosis Date  . CHF (congestive heart failure) (HCC)   . Diabetes mellitus without complication (HCC)   . Dyslipidemia     Past Surgical History:  Procedure Laterality Date  . BREAST BIOPSY    . CATARACT EXTRACTION      Social History   Social History  . Marital status: Widowed    Spouse name: N/A  . Number of children: N/A  . Years of education: N/A   Occupational History  . Not on file.   Social History Main Topics  . Smoking status: Never Smoker  . Smokeless tobacco: Never Used  . Alcohol use Not on file  . Drug use: Unknown  . Sexual activity: Not on file   Other Topics Concern  . Not on file   Social History Narrative  . No narrative on file     Vitals:   07/25/16 1300  BP: 134/88  Pulse: 82  Weight: 194  lb 12.8 oz (88.4 kg)  Height: 5' (1.524 m)    PHYSICAL EXAM General: NAD Neck: No JVD, no thyromegaly or thyroid nodule.  Lungs: Clear to auscultation bilaterally with normal respiratory effort. CV: Nondisplaced PMI. Regular rate and irregular rhythm, normal S1/S2, no S3, no murmur.  1+ pitting pretibial and periankle edema.  Wearing right knee brace.  Abdomen: Soft, nontender,obese. Skin: Intact without lesions or rashes.  Neurologic: Alert and oriented x 3.  Psych: Normal affect. Extremities: No clubbing or cyanosis.  HEENT: Normal.     ECG: Most recent ECG reviewed.      ASSESSMENT AND PLAN: 1. Chronic systolic heart failure: NYHA class II  symptoms. Currently on carvedilol 12.5 mg twice daily, Lasix 40 mg daily, lisinopril 10 mg daily, and spironolactone 12.5 mg daily. I will switch ACEI to [facility].  2. Chronic atrial fibrillation: Anticoagulated with warfarin. HR controlled with Coreg.  3. Essential HTN: Controlled. Will monitor.   4. Pulmonary embolism/protein S deficiency: Continue warfarin.  Dispo: fu 4 months.   Prentice Docker, M.D., F.A.C.C.

## 2016-08-06 DIAGNOSIS — E1165 Type 2 diabetes mellitus with hyperglycemia: Secondary | ICD-10-CM | POA: Diagnosis not present

## 2016-08-06 DIAGNOSIS — I509 Heart failure, unspecified: Secondary | ICD-10-CM | POA: Diagnosis not present

## 2016-08-06 DIAGNOSIS — I2699 Other pulmonary embolism without acute cor pulmonale: Secondary | ICD-10-CM | POA: Diagnosis not present

## 2016-08-06 DIAGNOSIS — R21 Rash and other nonspecific skin eruption: Secondary | ICD-10-CM | POA: Diagnosis not present

## 2016-08-18 ENCOUNTER — Other Ambulatory Visit: Payer: Self-pay | Admitting: Cardiovascular Disease

## 2016-09-03 DIAGNOSIS — Z299 Encounter for prophylactic measures, unspecified: Secondary | ICD-10-CM | POA: Diagnosis not present

## 2016-09-03 DIAGNOSIS — I2699 Other pulmonary embolism without acute cor pulmonale: Secondary | ICD-10-CM | POA: Diagnosis not present

## 2016-09-03 DIAGNOSIS — Z713 Dietary counseling and surveillance: Secondary | ICD-10-CM | POA: Diagnosis not present

## 2016-09-03 DIAGNOSIS — Z6836 Body mass index (BMI) 36.0-36.9, adult: Secondary | ICD-10-CM | POA: Diagnosis not present

## 2016-09-30 DIAGNOSIS — Z6837 Body mass index (BMI) 37.0-37.9, adult: Secondary | ICD-10-CM | POA: Diagnosis not present

## 2016-09-30 DIAGNOSIS — Z713 Dietary counseling and surveillance: Secondary | ICD-10-CM | POA: Diagnosis not present

## 2016-09-30 DIAGNOSIS — Z299 Encounter for prophylactic measures, unspecified: Secondary | ICD-10-CM | POA: Diagnosis not present

## 2016-09-30 DIAGNOSIS — I2699 Other pulmonary embolism without acute cor pulmonale: Secondary | ICD-10-CM | POA: Diagnosis not present

## 2016-10-27 DIAGNOSIS — Z1211 Encounter for screening for malignant neoplasm of colon: Secondary | ICD-10-CM | POA: Diagnosis not present

## 2016-10-27 DIAGNOSIS — Z299 Encounter for prophylactic measures, unspecified: Secondary | ICD-10-CM | POA: Diagnosis not present

## 2016-10-27 DIAGNOSIS — Z789 Other specified health status: Secondary | ICD-10-CM | POA: Diagnosis not present

## 2016-10-27 DIAGNOSIS — Z0000 Encounter for general adult medical examination without abnormal findings: Secondary | ICD-10-CM | POA: Diagnosis not present

## 2016-10-27 DIAGNOSIS — Z79899 Other long term (current) drug therapy: Secondary | ICD-10-CM | POA: Diagnosis not present

## 2016-10-27 DIAGNOSIS — Z7189 Other specified counseling: Secondary | ICD-10-CM | POA: Diagnosis not present

## 2016-10-27 DIAGNOSIS — E7800 Pure hypercholesterolemia, unspecified: Secondary | ICD-10-CM | POA: Diagnosis not present

## 2016-10-27 DIAGNOSIS — Z1389 Encounter for screening for other disorder: Secondary | ICD-10-CM | POA: Diagnosis not present

## 2016-10-27 DIAGNOSIS — I2699 Other pulmonary embolism without acute cor pulmonale: Secondary | ICD-10-CM | POA: Diagnosis not present

## 2016-10-28 DIAGNOSIS — R5383 Other fatigue: Secondary | ICD-10-CM | POA: Diagnosis not present

## 2016-10-28 DIAGNOSIS — E7800 Pure hypercholesterolemia, unspecified: Secondary | ICD-10-CM | POA: Diagnosis not present

## 2016-10-28 DIAGNOSIS — Z79899 Other long term (current) drug therapy: Secondary | ICD-10-CM | POA: Diagnosis not present

## 2016-10-31 DIAGNOSIS — E7800 Pure hypercholesterolemia, unspecified: Secondary | ICD-10-CM | POA: Diagnosis not present

## 2016-10-31 DIAGNOSIS — I509 Heart failure, unspecified: Secondary | ICD-10-CM | POA: Diagnosis not present

## 2016-10-31 DIAGNOSIS — I10 Essential (primary) hypertension: Secondary | ICD-10-CM | POA: Diagnosis not present

## 2016-10-31 DIAGNOSIS — E119 Type 2 diabetes mellitus without complications: Secondary | ICD-10-CM | POA: Diagnosis not present

## 2016-11-19 DIAGNOSIS — E119 Type 2 diabetes mellitus without complications: Secondary | ICD-10-CM | POA: Insufficient documentation

## 2016-11-19 DIAGNOSIS — M19042 Primary osteoarthritis, left hand: Secondary | ICD-10-CM

## 2016-11-19 DIAGNOSIS — M170 Bilateral primary osteoarthritis of knee: Secondary | ICD-10-CM | POA: Insufficient documentation

## 2016-11-19 DIAGNOSIS — Z86711 Personal history of pulmonary embolism: Secondary | ICD-10-CM | POA: Insufficient documentation

## 2016-11-19 DIAGNOSIS — Z86718 Personal history of other venous thrombosis and embolism: Secondary | ICD-10-CM | POA: Insufficient documentation

## 2016-11-19 DIAGNOSIS — M353 Polymyalgia rheumatica: Secondary | ICD-10-CM | POA: Insufficient documentation

## 2016-11-19 DIAGNOSIS — M1120 Other chondrocalcinosis, unspecified site: Secondary | ICD-10-CM | POA: Insufficient documentation

## 2016-11-19 DIAGNOSIS — Z8679 Personal history of other diseases of the circulatory system: Secondary | ICD-10-CM | POA: Insufficient documentation

## 2016-11-19 DIAGNOSIS — I10 Essential (primary) hypertension: Secondary | ICD-10-CM | POA: Insufficient documentation

## 2016-11-19 DIAGNOSIS — M19041 Primary osteoarthritis, right hand: Secondary | ICD-10-CM | POA: Insufficient documentation

## 2016-11-19 NOTE — Progress Notes (Addendum)
Office Visit Note  Patient: Rebecca Hanson             Date of Birth: August 13, 1932           MRN: [id number]             PCP: Ignatius Specking, MD Referring: Ignatius Specking, MD Visit Date: 11/20/2016 Occupation: @GUAROCC @    Subjective:  Right knee pain   History of Present Illness: Rebecca Hanson is a 83 y.o. female with history of polymyalgia rheumatica, osteoarthritis and pseudogout. She states she's been having pain in her right knee joint constantly. The pain has been worse in the last month. She denies any joint swelling. She also complains of pain in her bilateral shoulders . her hands are stiff in the morning.  Activities of Daily Living:  Patient reports morning stiffness for 1 hour.   Patient Denies nocturnal pain.  Difficulty dressing/grooming: Denies Difficulty climbing stairs: Reports Difficulty getting out of chair: Reports Difficulty using hands for taps, buttons, cutlery, and/or writing: Denies   Review of Systems  Constitutional: Positive for fatigue. Negative for night sweats, weight gain, weight loss and weakness.  HENT: Negative for mouth sores, trouble swallowing, trouble swallowing, mouth dryness and nose dryness.   Eyes: Negative for pain, redness, visual disturbance and dryness.  Respiratory: Negative for cough, shortness of breath and difficulty breathing.   Cardiovascular: Negative for chest pain, palpitations, hypertension, irregular heartbeat and swelling in legs/feet.  Gastrointestinal: Negative for blood in stool, constipation and diarrhea.  Endocrine: Negative for increased urination.  Genitourinary: Negative for vaginal dryness.  Musculoskeletal: Positive for arthralgias, joint pain, myalgias, morning stiffness and myalgias. Negative for joint swelling, muscle weakness and muscle tenderness.  Skin: Negative for color change, rash, hair loss, skin tightness, ulcers and sensitivity to sunlight.  Allergic/Immunologic: Negative for susceptible to  infections.  Neurological: Negative for dizziness, memory loss and night sweats.  Hematological: Negative for swollen glands.  Psychiatric/Behavioral: Negative for depressed mood and sleep disturbance. The patient is not nervous/anxious.     PMFS History:  Patient Active Problem List   Diagnosis Date Noted  . Polymyalgia rheumatica (HCC) 11/19/2016  . Primary osteoarthritis of both knees 11/19/2016  . Primary osteoarthritis of both hands 11/19/2016  . Pseudogout 11/19/2016  . Essential hypertension 11/19/2016  . Diabetes mellitus 11/19/2016  . History of DVT (deep vein thrombosis) 11/19/2016  . History of pulmonary embolism 11/19/2016  . History of congestive heart failure 11/19/2016    Past Medical History:  Diagnosis Date  . CHF (congestive heart failure) (HCC)   . Diabetes mellitus without complication (HCC)   . Dyslipidemia     Family History  Problem Relation Age of Onset  . Heart disease Father   . CAD Father   . CVA Father    Past Surgical History:  Procedure Laterality Date  . BREAST BIOPSY    . CATARACT EXTRACTION     Social History   Social History Narrative  . No narrative on file     Objective: Vital Signs: BP 136/82   Pulse 76   Resp 14   Ht 5' (1.524 m)   Wt 196 lb (88.9 kg)   BMI 38.28 kg/m    Physical Exam  Constitutional: She is oriented to person, place, and time. She appears well-developed and well-nourished.  HENT:  Head: Normocephalic and atraumatic.  Eyes: Conjunctivae and EOM are normal.  Neck: Normal range of motion.  Cardiovascular: Normal rate, regular rhythm, normal heart sounds and  intact distal pulses.   Pulmonary/Chest: Effort normal and breath sounds normal.  Abdominal: Soft. Bowel sounds are normal.  Musculoskeletal: She exhibits edema.  Bilateral LEs  Lymphadenopathy:    She has no cervical adenopathy.  Neurological: She is alert and oriented to person, place, and time.  Skin: Skin is warm and dry. Capillary refill  takes less than 2 seconds.  Psychiatric: She has a normal mood and affect. Her behavior is normal.  Nursing note and vitals reviewed.    Musculoskeletal Exam: C-spine and thoracic lumbar spine limited range of motion with not much discomfort. Shoulder joint abduction limited to 90 bilaterally with painful range of motion of the right shoulder. Elbow joints wrist joints MCPs PIPs DIPs with good range of motion she has DIP thickening consistent with osteoarthritis. No synovitis was noted. Hip joints knee joints ankles MTPs with good range of motion. She has discomfort in her right knee joint which she is using a brace for her. There was no warmth swelling or effusion in the right knee.  CDAI Exam: No CDAI exam completed.    Investigation: No additional findings.   Imaging: No results found.  Speciality Comments: No specialty comments available.    Procedures:  No procedures performed Allergies: Benadryl [diphenhydramine hcl (sleep)]; Penicillins; and Streptomycin sulfate [streptomycin]   Assessment / Plan:     Visit Diagnoses: Polymyalgia rheumatica (HCC): She appears to be in remission without any muscle weakness or tenderness on examination today.  Primary osteoarthritis of both knees: She's been having discomfort in her right knee joint for which she's been using a brace. We decided not to inject her has her sugars have been running high and she is diabetic.  Primary osteoarthritis of both hands: She continues to have some stiffness in her hands. Joint protection and muscle strengthening was discussed.  Pseudogout: She has not had a flare of her pseudogout.  Right shoulder joint pain: She is having difficulty lifting her right arm. I've given her handout on exercises. Can avoid injection of her right shoulder.  Essential hypertension  Diabetes mellitus  History of DVT (deep vein thrombosis)  History of pulmonary embolism - 2002  History of congestive heart failure -  2016    Orders: No orders of the defined types were placed in this encounter.  No orders of the defined types were placed in this encounter.   Face-to-face time spent with patient was 30 minutes. 50% of time was spent in counseling and coordination of care.  Follow-Up Instructions: Return in about 6 months (around 05/20/2017) for Osteoarthritis.   Pollyann Savoy, MD  Note - This record has been created using Animal nutritionist.  Chart creation errors have been sought, but may not always  have been located. Such creation errors do not reflect on  the standard of medical care.

## 2016-11-20 ENCOUNTER — Encounter: Payer: Self-pay | Admitting: Rheumatology

## 2016-11-20 ENCOUNTER — Ambulatory Visit (INDEPENDENT_AMBULATORY_CARE_PROVIDER_SITE_OTHER): Payer: Medicare Other | Admitting: Rheumatology

## 2016-11-20 VITALS — BP 136/82 | HR 76 | Resp 14 | Ht 60.0 in | Wt 196.0 lb

## 2016-11-20 DIAGNOSIS — Z8679 Personal history of other diseases of the circulatory system: Secondary | ICD-10-CM

## 2016-11-20 DIAGNOSIS — M170 Bilateral primary osteoarthritis of knee: Secondary | ICD-10-CM | POA: Diagnosis not present

## 2016-11-20 DIAGNOSIS — M19042 Primary osteoarthritis, left hand: Secondary | ICD-10-CM | POA: Diagnosis not present

## 2016-11-20 DIAGNOSIS — Z86711 Personal history of pulmonary embolism: Secondary | ICD-10-CM

## 2016-11-20 DIAGNOSIS — E119 Type 2 diabetes mellitus without complications: Secondary | ICD-10-CM

## 2016-11-20 DIAGNOSIS — I10 Essential (primary) hypertension: Secondary | ICD-10-CM

## 2016-11-20 DIAGNOSIS — M1120 Other chondrocalcinosis, unspecified site: Secondary | ICD-10-CM

## 2016-11-20 DIAGNOSIS — M19041 Primary osteoarthritis, right hand: Secondary | ICD-10-CM | POA: Diagnosis not present

## 2016-11-20 DIAGNOSIS — Z86718 Personal history of other venous thrombosis and embolism: Secondary | ICD-10-CM | POA: Diagnosis not present

## 2016-11-20 DIAGNOSIS — M353 Polymyalgia rheumatica: Secondary | ICD-10-CM | POA: Diagnosis not present

## 2016-11-20 NOTE — Patient Instructions (Signed)
Knee Exercises Ask your health care provider which exercises are safe for you. Do exercises exactly as told by your health care provider and adjust them as directed. It is normal to feel mild stretching, pulling, tightness, or discomfort as you do these exercises, but you should stop right away if you feel sudden pain or your pain gets worse.Do not begin these exercises until told by your health care provider. STRETCHING AND RANGE OF MOTION EXERCISES  These exercises warm up your muscles and joints and improve the movement and flexibility of your knee. These exercises also help to relieve pain, numbness, and tingling. Exercise A: Knee Extension, Prone 1. Lie on your abdomen on a bed. 2. Place your left / right knee just beyond the edge of the surface so your knee is not on the bed. You can put a towel under your left / right thigh just above your knee for comfort. 3. Relax your leg muscles and allow gravity to straighten your knee. You should feel a stretch behind your left / right knee. 4. Hold this position for __________ seconds. 5. Scoot up so your knee is supported between repetitions. Repeat __________ times. Complete this stretch __________ times a day. Exercise B: Knee Flexion, Active  1. Lie on your back with both knees straight. If this causes back discomfort, bend your left / right knee so your foot is flat on the floor. 2. Slowly slide your left / right heel back toward your buttocks until you feel a gentle stretch in the front of your knee or thigh. 3. Hold this position for __________ seconds. 4. Slowly slide your left / right heel back to the starting position. Repeat __________ times. Complete this exercise __________ times a day. Exercise C: Quadriceps, Prone  1. Lie on your abdomen on a firm surface, such as a bed or padded floor. 2. Bend your left / right knee and hold your ankle. If you cannot reach your ankle or pant leg, loop a belt around your foot and grab the belt  instead. 3. Gently pull your heel toward your buttocks. Your knee should not slide out to the side. You should feel a stretch in the front of your thigh and knee. 4. Hold this position for __________ seconds. Repeat __________ times. Complete this stretch __________ times a day. Exercise D: Hamstring, Supine 1. Lie on your back. 2. Loop a belt or towel over the ball of your left / right foot. The ball of your foot is on the walking surface, right under your toes. 3. Straighten your left / right knee and slowly pull on the belt to raise your leg until you feel a gentle stretch behind your knee.  Do not let your left / right knee bend while you do this.  Keep your other leg flat on the floor. 4. Hold this position for __________ seconds. Repeat __________ times. Complete this stretch __________ times a day. STRENGTHENING EXERCISES  These exercises build strength and endurance in your knee. Endurance is the ability to use your muscles for a long time, even after they get tired. Exercise E: Quadriceps, Isometric  1. Lie on your back with your left / right leg extended and your other knee bent. Put a rolled towel or small pillow under your knee if told by your health care provider. 2. Slowly tense the muscles in the front of your left / right thigh. You should see your kneecap slide up toward your hip or see increased dimpling just above the   knee. This motion will push the back of the knee toward the floor. 3. For __________ seconds, keep the muscle as tight as you can without increasing your pain. 4. Relax the muscles slowly and completely. Repeat __________ times. Complete this exercise __________ times a day. Exercise F: Straight Leg Raises - Quadriceps 1. Lie on your back with your left / right leg extended and your other knee bent. 2. Tense the muscles in the front of your left / right thigh. You should see your kneecap slide up or see increased dimpling just above the knee. Your thigh may  even shake a bit. 3. Keep these muscles tight as you raise your leg 4-6 inches (10-15 cm) off the floor. Do not let your knee bend. 4. Hold this position for __________ seconds. 5. Keep these muscles tense as you lower your leg. 6. Relax your muscles slowly and completely after each repetition. Repeat __________ times. Complete this exercise __________ times a day. Exercise G: Hamstring, Isometric 1. Lie on your back on a firm surface. 2. Bend your left / right knee approximately __________ degrees. 3. Dig your left / right heel into the surface as if you are trying to pull it toward your buttocks. Tighten the muscles in the back of your thighs to dig as hard as you can without increasing any pain. 4. Hold this position for __________ seconds. 5. Release the tension gradually and allow your muscles to relax completely for __________ seconds after each repetition. Repeat __________ times. Complete this exercise __________ times a day. Exercise H: Hamstring Curls  If told by your health care provider, do this exercise while wearing ankle weights. Begin with __________ weights. Then increase the weight by 1 lb (0.5 kg) increments. Do not wear ankle weights that are more than __________. 1. Lie on your abdomen with your legs straight. 2. Bend your left / right knee as far as you can without feeling pain. Keep your hips flat against the floor. 3. Hold this position for __________ seconds. 4. Slowly lower your leg to the starting position. Repeat __________ times. Complete this exercise __________ times a day. Exercise I: Squats (Quadriceps) 1. Stand in front of a table, with your feet and knees pointing straight ahead. You may rest your hands on the table for balance but not for support. 2. Slowly bend your knees and lower your hips like you are going to sit in a chair.  Keep your weight over your heels, not over your toes.  Keep your lower legs upright so they are parallel with the table  legs.  Do not let your hips go lower than your knees.  Do not bend lower than told by your health care provider.  If your knee pain increases, do not bend as low. 3. Hold the squat position for __________ seconds. 4. Slowly push with your legs to return to standing. Do not use your hands to pull yourself to standing. Repeat __________ times. Complete this exercise __________ times a day. Exercise J: Wall Slides (Quadriceps)  1. Lean your back against a smooth wall or door while you walk your feet out 18-24 inches (46-61 cm) from it. 2. Place your feet hip-width apart. 3. Slowly slide down the wall or door until your knees bend __________ degrees. Keep your knees over your heels, not over your toes. Keep your knees in line with your hips. 4. Hold for __________ seconds. Repeat __________ times. Complete this exercise __________ times a day. Exercise K: Straight Leg Raises -   Hip Abductors 1. Lie on your side with your left / right leg in the top position. Lie so your head, shoulder, knee, and hip line up. You may bend your bottom knee to help you keep your balance. 2. Roll your hips slightly forward so your hips are stacked directly over each other and your left / right knee is facing forward. 3. Leading with your heel, lift your top leg 4-6 inches (10-15 cm). You should feel the muscles in your outer hip lifting.  Do not let your foot drift forward.  Do not let your knee roll toward the ceiling. 4. Hold this position for __________ seconds. 5. Slowly return your leg to the starting position. 6. Let your muscles relax completely after each repetition. Repeat __________ times. Complete this exercise __________ times a day. Exercise L: Straight Leg Raises - Hip Extensors 1. Lie on your abdomen on a firm surface. You can put a pillow under your hips if that is more comfortable. 2. Tense the muscles in your buttocks and lift your left / right leg about 4-6 inches (10-15 cm). Keep your knee  straight as you lift your leg. 3. Hold this position for __________ seconds. 4. Slowly lower your leg to the starting position. 5. Let your leg relax completely after each repetition. Repeat __________ times. Complete this exercise __________ times a day. This information is not intended to replace advice given to you by your health care provider. Make sure you discuss any questions you have with your health care provider. Document Released: 09/10/2005 Document Revised: 07/21/2016 Document Reviewed: 09/02/2015 [employer] Interactive Patient Education  2017 [employer] Shoulder Exercises Ask your health care provider which exercises are safe for you. Do exercises exactly as told by your health care provider and adjust them as directed. It is normal to feel mild stretching, pulling, tightness, or discomfort as you do these exercises, but you should stop right away if you feel sudden pain or your pain gets worse.Do not begin these exercises until told by your health care provider. RANGE OF MOTION EXERCISES  These exercises warm up your muscles and joints and improve the movement and flexibility of your shoulder. These exercises also help to relieve pain, numbness, and tingling. These exercises involve stretching your injured shoulder directly. Exercise A: Pendulum  1. Stand near a wall or a surface that you can hold onto for balance. 2. Bend at the waist and let your left / right arm hang straight down. Use your other arm to support you. Keep your back straight and do not lock your knees. 3. Relax your left / right arm and shoulder muscles, and move your hips and your trunk so your left / right arm swings freely. Your arm should swing because of the motion of your body, not because you are using your arm or shoulder muscles. 4. Keep moving your body so your arm swings in the following directions, as told by your health care provider:  Side to side.  Forward and backward.  In clockwise and  counterclockwise circles. 5. Continue each motion for __________ seconds, or for as long as told by your health care provider. 6. Slowly return to the starting position. Repeat __________ times. Complete this exercise __________ times a day. Exercise B:Flexion, Standing  1. Stand and hold a broomstick, a cane, or a similar object. Place your hands a little more than shoulder-width apart on the object. Your left / right hand should be palm-up, and your other hand should be palm-down.   2. Keep your elbow straight and keep your shoulder muscles relaxed. Push the stick down with your healthy arm to raise your left / right arm in front of your body, and then over your head until you feel a stretch in your shoulder.  Avoid shrugging your shoulder while you raise your arm. Keep your shoulder blade tucked down toward the middle of your back. 3. Hold for __________ seconds. 4. Slowly return to the starting position. Repeat __________ times. Complete this exercise __________ times a day. Exercise C: Abduction, Standing 1. Stand and hold a broomstick, a cane, or a similar object. Place your hands a little more than shoulder-width apart on the object. Your left / right hand should be palm-up, and your other hand should be palm-down. 2. While keeping your elbow straight and your shoulder muscles relaxed, push the stick across your body toward your left / right side. Raise your left / right arm to the side of your body and then over your head until you feel a stretch in your shoulder.  Do not raise your arm above shoulder height, unless your health care provider tells you to do that.  Avoid shrugging your shoulder while you raise your arm. Keep your shoulder blade tucked down toward the middle of your back. 3. Hold for __________ seconds. 4. Slowly return to the starting position. Repeat __________ times. Complete this exercise __________ times a day. Exercise D:Internal Rotation  5. Place your left /  right hand behind your back, palm-up. 6. Use your other hand to dangle an exercise band, a towel, or a similar object over your shoulder. Grasp the band with your left / right hand so you are holding onto both ends. 7. Gently pull up on the band until you feel a stretch in the front of your left / right shoulder.  Avoid shrugging your shoulder while you raise your arm. Keep your shoulder blade tucked down toward the middle of your back. 8. Hold for __________ seconds. 9. Release the stretch by letting go of the band and lowering your hands. Repeat __________ times. Complete this exercise __________ times a day. STRETCHING EXERCISES  These exercises warm up your muscles and joints and improve the movement and flexibility of your shoulder. These exercises also help to relieve pain, numbness, and tingling. These exercises are done using your healthy shoulder to help stretch the muscles of your injured shoulder. Exercise E: Corner Stretch (External Rotation and Abduction)  1. Stand in a doorway with one of your feet slightly in front of the other. This is called a staggered stance. If you cannot reach your forearms to the door frame, stand facing a corner of a room. 2. Choose one of the following positions as told by your health care provider:  Place your hands and forearms on the door frame above your head.  Place your hands and forearms on the door frame at the height of your head.  Place your hands on the door frame at the height of your elbows. 3. Slowly move your weight onto your front foot until you feel a stretch across your chest and in the front of your shoulders. Keep your head and chest upright and keep your abdominal muscles tight. 4. Hold for __________ seconds. 5. To release the stretch, shift your weight to your back foot. Repeat __________ times. Complete this stretch __________ times a day. Exercise F:Extension, Standing 1. Stand and hold a broomstick, a cane, or a similar  object behind your back.    Your hands should be a little wider than shoulder-width apart.  Your palms should face away from your back. 2. Keeping your elbows straight and keeping your shoulder muscles relaxed, move the stick away from your body until you feel a stretch in your shoulder.  Avoid shrugging your shoulders while you move the stick. Keep your shoulder blade tucked down toward the middle of your back. 3. Hold for __________ seconds. 4. Slowly return to the starting position. Repeat __________ times. Complete this exercise __________ times a day. STRENGTHENING EXERCISES  These exercises build strength and endurance in your shoulder. Endurance is the ability to use your muscles for a long time, even after they get tired. Exercise G:External Rotation  6. Sit in a stable chair without armrests. 7. Secure an exercise band at elbow height on your left / right side. 8. Place a soft object, such as a folded towel or a small pillow, between your left / right upper arm and your body to move your elbow a few inches away (about 10 cm) from your side. 9. Hold the end of the band so it is tight and there is no slack. 10. Keeping your elbow pressed against the soft object, move your left / right forearm out, away from your abdomen. Keep your body steady so only your forearm moves. 11. Hold for __________ seconds. 12. Slowly return to the starting position. Repeat __________ times. Complete this exercise __________ times a day. Exercise H:Shoulder Abduction  1. Sit in a stable chair without armrests, or stand. 2. Hold a __________ weight in your left / right hand, or hold an exercise band with both hands. 3. Start with your arms straight down and your left / right palm facing in, toward your body. 4. Slowly lift your left / right hand out to your side. Do not lift your hand above shoulder height unless your health care provider tells you that this is safe.  Keep your arms straight.  Avoid  shrugging your shoulder while you do this movement. Keep your shoulder blade tucked down toward the middle of your back. 5. Hold for __________ seconds. 6. Slowly lower your arm, and return to the starting position. Repeat __________ times. Complete this exercise __________ times a day. Exercise I:Shoulder Extension 1. Sit in a stable chair without armrests, or stand. 2. Secure an exercise band to a stable object in front of you where it is at shoulder height. 3. Hold one end of the exercise band in each hand. Your palms should face each other. 4. Straighten your elbows and lift your hands up to shoulder height. 5. Step back, away from the secured end of the exercise band, until the band is tight and there is no slack. 6. Squeeze your shoulder blades together as you pull your hands down to the sides of your thighs. Stop when your hands are straight down by your sides. Do not let your hands go behind your body. 7. Hold for __________ seconds. 8. Slowly return to the starting position. Repeat __________ times. Complete this exercise __________ times a day. Exercise J:Standing Shoulder Row 5. Sit in a stable chair without armrests, or stand. 6. Secure an exercise band to a stable object in front of you so it is at waist height. 7. Hold one end of the exercise band in each hand. Your palms should be in a thumbs-up position. 8. Bend each of your elbows to an "L" shape (about 90 degrees) and keep your upper arms at your sides.   9. Step back until the band is tight and there is no slack. 10. Slowly pull your elbows back behind you. 11. Hold for __________ seconds. 12. Slowly return to the starting position. Repeat __________ times. Complete this exercise __________ times a day. Exercise K:Shoulder Press-Ups  7. Sit in a stable chair that has armrests. Sit upright, with your feet flat on the floor. 8. Put your hands on the armrests so your elbows are bent and your fingers are pointing forward.  Your hands should be about even with the sides of your body. 9. Push down on the armrests and use your arms to lift yourself off of the chair. Straighten your elbows and lift yourself up as much as you comfortably can.  Move your shoulder blades down, and avoid letting your shoulders move up toward your ears.  Keep your feet on the ground. As you get stronger, your feet should support less of your body weight as you lift yourself up. 10. Hold for __________ seconds. 11. Slowly lower yourself back into the chair. Repeat __________ times. Complete this exercise __________ times a day. Exercise L: Wall Push-Ups  6. Stand so you are facing a stable wall. Your feet should be about one arm-length away from the wall. 7. Lean forward and place your palms on the wall at shoulder height. 8. Keep your feet flat on the floor as you bend your elbows and lean forward toward the wall. 9. Hold for __________ seconds. 10. Straighten your elbows to push yourself back to the starting position. Repeat __________ times. Complete this exercise __________ times a day. This information is not intended to replace advice given to you by your health care provider. Make sure you discuss any questions you have with your health care provider. Document Released: 09/10/2005 Document Revised: 07/21/2016 Document Reviewed: 07/08/2015 [employer] Interactive Patient Education  2017 [employer]  

## 2016-11-24 ENCOUNTER — Ambulatory Visit (INDEPENDENT_AMBULATORY_CARE_PROVIDER_SITE_OTHER): Payer: Medicare Other | Admitting: Cardiovascular Disease

## 2016-11-24 ENCOUNTER — Encounter: Payer: Self-pay | Admitting: Cardiovascular Disease

## 2016-11-24 VITALS — BP 138/80 | HR 79 | Ht 60.0 in | Wt 197.0 lb

## 2016-11-24 DIAGNOSIS — Z23 Encounter for immunization: Secondary | ICD-10-CM | POA: Diagnosis not present

## 2016-11-24 DIAGNOSIS — I429 Cardiomyopathy, unspecified: Secondary | ICD-10-CM | POA: Diagnosis not present

## 2016-11-24 DIAGNOSIS — I5022 Chronic systolic (congestive) heart failure: Secondary | ICD-10-CM

## 2016-11-24 DIAGNOSIS — I482 Chronic atrial fibrillation, unspecified: Secondary | ICD-10-CM

## 2016-11-24 DIAGNOSIS — I10 Essential (primary) hypertension: Secondary | ICD-10-CM

## 2016-11-24 DIAGNOSIS — D6859 Other primary thrombophilia: Secondary | ICD-10-CM

## 2016-11-24 DIAGNOSIS — Z86711 Personal history of pulmonary embolism: Secondary | ICD-10-CM

## 2016-11-24 NOTE — Progress Notes (Signed)
SUBJECTIVE: The patient is an 83 year old woman with a history of chronic systolic congestive heart failure, chronic atrial fibrillation, and mitral regurgitation.   Echocardiogram performed on 05/14/16 reportedly demonstrated severely reduced left ventricular systolic function, LVEF 30-35%, mild LVH, mild mitral and tricuspid regurgitation, moderate left atrial and mild right atrial enlargement, and mildly elevated pulmonary pressures.  She denies a history of myocardial infarction. She has a history of protein S deficiency and pulmonary embolism. She has venous varicosities. She denies chest pain but does have exertional dyspnea which is stable.   Nuclear stress test 06/11/16 did not show any large ischemic territories. There was a small, mild intensity, apical anterior defect that was partially reversible and thought related to attenuation artifact versus a minor region of ischemia.  She is tolerating Entresto.   Review of Systems: As per "subjective", otherwise negative.  Allergies  Allergen Reactions  . Benadryl [Diphenhydramine Hcl (Sleep)] Other (See Comments)    "Makes me wild acting"  . Penicillins     rash  . Streptomycin Sulfate [Streptomycin]     rash    Current Outpatient Prescriptions  Medication Sig Dispense Refill  . calcium carbonate (OS-CAL) 600 MG TABS tablet Take 1 tablet by mouth 2 (two) times daily.    . carvedilol (COREG) 12.5 MG tablet Take 1 tablet by mouth 2 (two) times daily.    . cetirizine (ZYRTEC) 10 MG tablet Take 1 tablet by mouth daily.    Marland Kitchen docusate sodium (COLACE) 100 MG capsule Take 1 capsule by mouth 3 (three) times daily as needed.    Marland Kitchen ENTRESTO 24-26 MG TAKE 1 TABLET BY MOUTH TWICE DAILY 60 tablet 3  . furosemide (LASIX) 40 MG tablet Take 40 mg by mouth daily.     Marland Kitchen glimepiride (AMARYL) 2 MG tablet Take 1 tablet by mouth daily with breakfast.     . metFORMIN (GLUCOPHAGE) 500 MG tablet Take 1,000 mg by mouth 2 (two) times daily.     .  Multiple Vitamins-Minerals (MULTIVITAMIN & MINERAL PO) Take 1 tablet by mouth daily.    . potassium chloride (K-DUR,KLOR-CON) 10 MEQ tablet Take 10 mEq by mouth daily.    Marland Kitchen spironolactone (ALDACTONE) 25 MG tablet Take 0.5 tablets by mouth daily.    Marland Kitchen warfarin (COUMADIN) 2.5 MG tablet Take by mouth.     No current facility-administered medications for this visit.     Past Medical History:  Diagnosis Date  . CHF (congestive heart failure) (HCC)   . Diabetes mellitus without complication (HCC)   . Dyslipidemia     Past Surgical History:  Procedure Laterality Date  . BREAST BIOPSY    . CATARACT EXTRACTION      Social History   Social History  . Marital status: Widowed    Spouse name: N/A  . Number of children: N/A  . Years of education: N/A   Occupational History  . Not on file.   Social History Main Topics  . Smoking status: Never Smoker  . Smokeless tobacco: Never Used  . Alcohol use Not on file  . Drug use: Unknown  . Sexual activity: Not on file   Other Topics Concern  . Not on file   Social History Narrative  . No narrative on file     Vitals:   11/24/16 1528  BP: 138/80  Pulse: 79  Weight: 197 lb (89.4 kg)  Height: 5' (1.524 m)    PHYSICAL EXAM General: NAD Neck: No JVD, no thyromegaly  or thyroid nodule.  Lungs: Clear to auscultation bilaterally with normal respiratory effort. CV: Nondisplaced PMI. Regular rate and irregular rhythm, normal S1/S2, no S3, no murmur. 1+ pitting pretibial and periankle edema b/l. Wearing right knee brace.  Abdomen: Soft, nontender,obese. Skin: Intact without lesions or rashes.  Neurologic: Alert and oriented x 3.  Psych: Normal affect. Extremities: No clubbing or cyanosis.  HEENT: Normal.     ECG: Most recent ECG reviewed.      ASSESSMENT AND PLAN: 1. Chronic systolic heart failure: NYHA class II symptoms. Currently on carvedilol 12.5 mg twice daily, Lasix 40 mg daily, Entresto, and spironolactone 12.5 mg  daily. No changes.  2. Chronic atrial fibrillation: Anticoagulated with warfarin. HR controlled with Coreg.  3. Essential HTN: Controlled. Will monitor.   4. Pulmonary embolism/protein S deficiency: Continue warfarin.  Dispo: fu 6 months.   Prentice Docker, M.D., F.A.C.C.

## 2016-11-24 NOTE — Patient Instructions (Signed)

## 2016-12-01 DIAGNOSIS — E119 Type 2 diabetes mellitus without complications: Secondary | ICD-10-CM | POA: Diagnosis not present

## 2016-12-01 DIAGNOSIS — I4891 Unspecified atrial fibrillation: Secondary | ICD-10-CM | POA: Diagnosis not present

## 2016-12-11 DIAGNOSIS — Z1231 Encounter for screening mammogram for malignant neoplasm of breast: Secondary | ICD-10-CM | POA: Diagnosis not present

## 2016-12-16 ENCOUNTER — Other Ambulatory Visit: Payer: Self-pay | Admitting: Cardiovascular Disease

## 2016-12-22 DIAGNOSIS — H40013 Open angle with borderline findings, low risk, bilateral: Secondary | ICD-10-CM | POA: Diagnosis not present

## 2016-12-22 DIAGNOSIS — Z961 Presence of intraocular lens: Secondary | ICD-10-CM | POA: Diagnosis not present

## 2016-12-22 DIAGNOSIS — H26492 Other secondary cataract, left eye: Secondary | ICD-10-CM | POA: Diagnosis not present

## 2016-12-22 DIAGNOSIS — H43812 Vitreous degeneration, left eye: Secondary | ICD-10-CM | POA: Diagnosis not present

## 2016-12-22 DIAGNOSIS — E119 Type 2 diabetes mellitus without complications: Secondary | ICD-10-CM | POA: Diagnosis not present

## 2016-12-29 DIAGNOSIS — Z713 Dietary counseling and surveillance: Secondary | ICD-10-CM | POA: Diagnosis not present

## 2016-12-29 DIAGNOSIS — Z299 Encounter for prophylactic measures, unspecified: Secondary | ICD-10-CM | POA: Diagnosis not present

## 2016-12-29 DIAGNOSIS — E1165 Type 2 diabetes mellitus with hyperglycemia: Secondary | ICD-10-CM | POA: Diagnosis not present

## 2016-12-29 DIAGNOSIS — Z6836 Body mass index (BMI) 36.0-36.9, adult: Secondary | ICD-10-CM | POA: Diagnosis not present

## 2016-12-29 DIAGNOSIS — Z789 Other specified health status: Secondary | ICD-10-CM | POA: Diagnosis not present

## 2016-12-29 DIAGNOSIS — I2699 Other pulmonary embolism without acute cor pulmonale: Secondary | ICD-10-CM | POA: Diagnosis not present

## 2016-12-29 DIAGNOSIS — I4891 Unspecified atrial fibrillation: Secondary | ICD-10-CM | POA: Diagnosis not present

## 2017-01-26 DIAGNOSIS — Z299 Encounter for prophylactic measures, unspecified: Secondary | ICD-10-CM | POA: Diagnosis not present

## 2017-01-26 DIAGNOSIS — I2699 Other pulmonary embolism without acute cor pulmonale: Secondary | ICD-10-CM | POA: Diagnosis not present

## 2017-01-26 DIAGNOSIS — E7800 Pure hypercholesterolemia, unspecified: Secondary | ICD-10-CM | POA: Diagnosis not present

## 2017-01-26 DIAGNOSIS — E1165 Type 2 diabetes mellitus with hyperglycemia: Secondary | ICD-10-CM | POA: Diagnosis not present

## 2017-01-26 DIAGNOSIS — Z713 Dietary counseling and surveillance: Secondary | ICD-10-CM | POA: Diagnosis not present

## 2017-01-26 DIAGNOSIS — I509 Heart failure, unspecified: Secondary | ICD-10-CM | POA: Diagnosis not present

## 2017-01-26 DIAGNOSIS — I429 Cardiomyopathy, unspecified: Secondary | ICD-10-CM | POA: Diagnosis not present

## 2017-01-26 DIAGNOSIS — Z6836 Body mass index (BMI) 36.0-36.9, adult: Secondary | ICD-10-CM | POA: Diagnosis not present

## 2017-01-26 DIAGNOSIS — I10 Essential (primary) hypertension: Secondary | ICD-10-CM | POA: Diagnosis not present

## 2017-03-05 DIAGNOSIS — I5020 Unspecified systolic (congestive) heart failure: Secondary | ICD-10-CM | POA: Diagnosis not present

## 2017-03-05 DIAGNOSIS — I255 Ischemic cardiomyopathy: Secondary | ICD-10-CM | POA: Diagnosis not present

## 2017-03-05 DIAGNOSIS — I10 Essential (primary) hypertension: Secondary | ICD-10-CM | POA: Diagnosis not present

## 2017-03-05 DIAGNOSIS — I2699 Other pulmonary embolism without acute cor pulmonale: Secondary | ICD-10-CM | POA: Diagnosis not present

## 2017-03-05 DIAGNOSIS — I429 Cardiomyopathy, unspecified: Secondary | ICD-10-CM | POA: Diagnosis not present

## 2017-03-05 DIAGNOSIS — Z6837 Body mass index (BMI) 37.0-37.9, adult: Secondary | ICD-10-CM | POA: Diagnosis not present

## 2017-03-05 DIAGNOSIS — Z299 Encounter for prophylactic measures, unspecified: Secondary | ICD-10-CM | POA: Diagnosis not present

## 2017-03-05 DIAGNOSIS — E7800 Pure hypercholesterolemia, unspecified: Secondary | ICD-10-CM | POA: Diagnosis not present

## 2017-03-05 DIAGNOSIS — E669 Obesity, unspecified: Secondary | ICD-10-CM | POA: Diagnosis not present

## 2017-03-05 DIAGNOSIS — G5600 Carpal tunnel syndrome, unspecified upper limb: Secondary | ICD-10-CM | POA: Diagnosis not present

## 2017-03-05 DIAGNOSIS — E1165 Type 2 diabetes mellitus with hyperglycemia: Secondary | ICD-10-CM | POA: Diagnosis not present

## 2017-04-02 DIAGNOSIS — I429 Cardiomyopathy, unspecified: Secondary | ICD-10-CM | POA: Diagnosis not present

## 2017-04-02 DIAGNOSIS — E669 Obesity, unspecified: Secondary | ICD-10-CM | POA: Diagnosis not present

## 2017-04-02 DIAGNOSIS — Z299 Encounter for prophylactic measures, unspecified: Secondary | ICD-10-CM | POA: Diagnosis not present

## 2017-04-02 DIAGNOSIS — G5600 Carpal tunnel syndrome, unspecified upper limb: Secondary | ICD-10-CM | POA: Diagnosis not present

## 2017-04-02 DIAGNOSIS — Z6837 Body mass index (BMI) 37.0-37.9, adult: Secondary | ICD-10-CM | POA: Diagnosis not present

## 2017-04-02 DIAGNOSIS — I255 Ischemic cardiomyopathy: Secondary | ICD-10-CM | POA: Diagnosis not present

## 2017-04-02 DIAGNOSIS — E1165 Type 2 diabetes mellitus with hyperglycemia: Secondary | ICD-10-CM | POA: Diagnosis not present

## 2017-04-02 DIAGNOSIS — E7800 Pure hypercholesterolemia, unspecified: Secondary | ICD-10-CM | POA: Diagnosis not present

## 2017-04-02 DIAGNOSIS — I5020 Unspecified systolic (congestive) heart failure: Secondary | ICD-10-CM | POA: Diagnosis not present

## 2017-04-02 DIAGNOSIS — I10 Essential (primary) hypertension: Secondary | ICD-10-CM | POA: Diagnosis not present

## 2017-04-02 DIAGNOSIS — I2699 Other pulmonary embolism without acute cor pulmonale: Secondary | ICD-10-CM | POA: Diagnosis not present

## 2017-04-20 ENCOUNTER — Ambulatory Visit (INDEPENDENT_AMBULATORY_CARE_PROVIDER_SITE_OTHER): Payer: Medicare Other | Admitting: Rheumatology

## 2017-04-20 ENCOUNTER — Encounter: Payer: Self-pay | Admitting: Rheumatology

## 2017-04-20 VITALS — BP 124/70 | HR 74 | Resp 14 | Ht 60.0 in | Wt 186.0 lb

## 2017-04-20 DIAGNOSIS — M19041 Primary osteoarthritis, right hand: Secondary | ICD-10-CM

## 2017-04-20 DIAGNOSIS — Z86718 Personal history of other venous thrombosis and embolism: Secondary | ICD-10-CM

## 2017-04-20 DIAGNOSIS — M19042 Primary osteoarthritis, left hand: Secondary | ICD-10-CM

## 2017-04-20 DIAGNOSIS — M353 Polymyalgia rheumatica: Secondary | ICD-10-CM | POA: Diagnosis not present

## 2017-04-20 DIAGNOSIS — Z8679 Personal history of other diseases of the circulatory system: Secondary | ICD-10-CM

## 2017-04-20 DIAGNOSIS — E119 Type 2 diabetes mellitus without complications: Secondary | ICD-10-CM | POA: Diagnosis not present

## 2017-04-20 DIAGNOSIS — M170 Bilateral primary osteoarthritis of knee: Secondary | ICD-10-CM

## 2017-04-20 DIAGNOSIS — M1120 Other chondrocalcinosis, unspecified site: Secondary | ICD-10-CM

## 2017-04-20 DIAGNOSIS — Z86711 Personal history of pulmonary embolism: Secondary | ICD-10-CM

## 2017-04-20 LAB — CBC WITH DIFFERENTIAL/PLATELET
Basophils Absolute: 0 cells/uL (ref 0–200)
Basophils Relative: 0 %
EOS PCT: 8 %
Eosinophils Absolute: 400 cells/uL (ref 15–500)
HEMATOCRIT: 35.4 % (ref 35.0–45.0)
HEMOGLOBIN: 11.5 g/dL — AB (ref 11.7–15.5)
LYMPHS ABS: 1800 {cells}/uL (ref 850–3900)
Lymphocytes Relative: 36 %
MCH: 28.5 pg (ref 27.0–33.0)
MCHC: 32.5 g/dL (ref 32.0–36.0)
MCV: 87.8 fL (ref 80.0–100.0)
MONO ABS: 400 {cells}/uL (ref 200–950)
MPV: 10.1 fL (ref 7.5–12.5)
Monocytes Relative: 8 %
NEUTROS ABS: 2400 {cells}/uL (ref 1500–7800)
NEUTROS PCT: 48 %
Platelets: 140 10*3/uL (ref 140–400)
RBC: 4.03 MIL/uL (ref 3.80–5.10)
RDW: 15 % (ref 11.0–15.0)
WBC: 5 10*3/uL (ref 3.8–10.8)

## 2017-04-20 NOTE — Progress Notes (Signed)
Office Visit Note  Patient: Rebecca Hanson             Date of Birth: 04-Mar-1932           MRN: [id number]             PCP: Ignatius Specking, MD Referring: Ignatius Specking, MD Visit Date: 04/20/2017 Occupation: @GUAROCC @    Subjective:  Medication Management   History of Present Illness: Rebecca Hanson is a 83 y.o. female   Last seen 11/20/2016  No change in symptoms since last visit. Has ongoing shoulder joint pain probably secondary to osteoarthritis.  Patient has history of pseudogout. No flare.  Also has severe osteoarthritis of the knees were it is bone-on-bone. Patient discussed with her PCP whether he would recommend patient undergo surgery and he recommended that it would not be safe for her.  Patient's PMR is fine. No flare.  Activities of Daily Living:  Patient reports morning stiffness for 30 minutes.   Patient Reports nocturnal pain.  Difficulty dressing/grooming: Reports Difficulty climbing stairs: Reports Difficulty getting out of chair: Reports Difficulty using hands for taps, buttons, cutlery, and/or writing: Reports   Review of Systems  Constitutional: Negative for fatigue.  HENT: Negative for mouth sores and mouth dryness.   Eyes: Negative for dryness.  Respiratory: Negative for shortness of breath.   Gastrointestinal: Negative for constipation and diarrhea.  Musculoskeletal: Negative for myalgias and myalgias.  Skin: Negative for sensitivity to sunlight.  Psychiatric/Behavioral: Negative for decreased concentration and sleep disturbance.    PMFS History:  Patient Active Problem List   Diagnosis Date Noted  . Polymyalgia rheumatica (HCC) 11/19/2016  . Primary osteoarthritis of both knees 11/19/2016  . Primary osteoarthritis of both hands 11/19/2016  . Pseudogout 11/19/2016  . Essential hypertension 11/19/2016  . Diabetes mellitus 11/19/2016  . History of DVT (deep vein thrombosis) 11/19/2016  . History of pulmonary embolism 11/19/2016  .  History of congestive heart failure 11/19/2016    Past Medical History:  Diagnosis Date  . CHF (congestive heart failure) (HCC)   . Diabetes mellitus without complication (HCC)   . Dyslipidemia     Family History  Problem Relation Age of Onset  . Heart disease Father   . CAD Father   . CVA Father    Past Surgical History:  Procedure Laterality Date  . BREAST BIOPSY    . CATARACT EXTRACTION     Social History   Social History Narrative  . No narrative on file     Objective: Vital Signs: BP 124/70   Pulse 74   Resp 14   Ht 5' (1.524 m)   Wt 186 lb (84.4 kg)   BMI 36.33 kg/m    Physical Exam  Constitutional: She is oriented to person, place, and time. She appears well-developed and well-nourished.  HENT:  Head: Normocephalic and atraumatic.  Eyes: EOM are normal. Pupils are equal, round, and reactive to light.  Cardiovascular: Normal rate, regular rhythm and normal heart sounds.  Exam reveals no gallop and no friction rub.   No murmur heard. Pulmonary/Chest: Effort normal and breath sounds normal. She has no wheezes. She has no rales.  Abdominal: Soft. Bowel sounds are normal. She exhibits no distension. There is no tenderness. There is no guarding. No hernia.  Musculoskeletal: Normal range of motion. She exhibits no edema, tenderness or deformity.  Lymphadenopathy:    She has no cervical adenopathy.  Neurological: She is alert and oriented to person, place, and time. Coordination  normal.  Skin: Skin is warm and dry. Capillary refill takes less than 2 seconds. No rash noted.  Psychiatric: She has a normal mood and affect. Her behavior is normal.  Nursing note and vitals reviewed.    Musculoskeletal Exam:  Full range of motion of all joints Grip strength is equal and strong bilaterally Fibromyalgia tender points are all absent Note: Patient has fairly good upper extremity strength. It is 3+ on exam Lower extremity strength is probably 4+.; Equal and strong  bilaterally   CDAI Exam: No CDAI exam completed.  No synovitis  Investigation: No additional findings. No visits with results within 6 Month(s) from this visit.  Latest known visit with results is:  Hospital Outpatient Visit on 06/11/2016  Component Date Value Ref Range Status  . Rest HR 06/11/2016 94  bpm Final  . Rest BP 06/11/2016 181/87  mmHg Final  . Percent HR 06/11/2016 79  % Final  . Peak HR 06/11/2016 110  bpm Final  . Peak BP 06/11/2016 181/87  mmHg Final  . MPHR 06/11/2016 138  bpm Final  . SSS 06/11/2016 2   Final  . SRS 06/11/2016 1   Final  . SDS 06/11/2016 1   Final  . LHR 06/11/2016 0.31   Final  . TID 06/11/2016 1.04   Final  . LV sys vol 06/11/2016 69  mL Final  . LV dias vol 06/11/2016 98  46 - 106 mL Final     Imaging: No results found.  Speciality Comments: No specialty comments available.    Procedures:  No procedures performed Allergies: Benadryl [diphenhydramine hcl (sleep)]; Penicillins; and Streptomycin sulfate [streptomycin]   Assessment / Plan:     Visit Diagnoses: Polymyalgia rheumatica (HCC) - Patient has weakness in her hip muscles in her shoulder muscles but they're from deconditioning and not from PMR flare; currently PMR in remission - Plan: CBC with Differential/Platelet, COMPLETE METABOLIC PANEL WITH GFR, CK, Sedimentation rate  Pseudogout - No flare.  Diabetes mellitus  History of DVT (deep vein thrombosis)  History of pulmonary embolism - Patient is on Coumadin due to pulmonary embolism  History of congestive heart failure  Primary osteoarthritis of both hands  Primary osteoarthritis of both knees - Knees are bone-on-bone and have grinding sensation with flexion and extension   Note: Patient has not had labs done in a while so we will do CBC with differential, CMP with GFR, CK, sedimentation rate today. I do not suspect the patient has a recurrence of her PMR at this time and I will check her sedimentation ratek. Her next  annual physical will be done December 2018 at Dr. Bonnita Levan office.  Orders: Orders Placed This Encounter  Procedures  . CBC with Differential/Platelet  . COMPLETE METABOLIC PANEL WITH GFR  . CK  . Sedimentation rate   No orders of the defined types were placed in this encounter.   Face-to-face time spent with patient was 30 minutes. 50% of time was spent in counseling and coordination of care.  Follow-Up Instructions: Return in about 5 months (around 09/20/2017) for PMR, Pseudogout,oakj,oahands,knee pain,oashouldjt.   Tawni Pummel, PA-C  Note - This record has been created using [employer].  Chart creation errors have been sought, but may not always  have been located. Such creation errors do not reflect on  the standard of medical care.

## 2017-04-21 LAB — COMPLETE METABOLIC PANEL WITH GFR
ALBUMIN: 3.9 g/dL (ref 3.6–5.1)
ALK PHOS: 57 U/L (ref 33–130)
ALT: 13 U/L (ref 6–29)
AST: 20 U/L (ref 10–35)
BILIRUBIN TOTAL: 0.5 mg/dL (ref 0.2–1.2)
BUN: 30 mg/dL — AB (ref 7–25)
CALCIUM: 9.7 mg/dL (ref 8.6–10.4)
CO2: 22 mmol/L (ref 20–31)
CREATININE: 1.27 mg/dL — AB (ref 0.60–0.88)
Chloride: 104 mmol/L (ref 98–110)
GFR, Est African American: 45 mL/min — ABNORMAL LOW (ref >=60)
GFR, Est Non African American: 39 mL/min — ABNORMAL LOW (ref >=60)
GLUCOSE: 99 mg/dL (ref 65–99)
POTASSIUM: 4.4 mmol/L (ref 3.5–5.3)
SODIUM: 139 mmol/L (ref 135–146)
TOTAL PROTEIN: 6.6 g/dL (ref 6.1–8.1)

## 2017-04-21 LAB — CK: CK TOTAL: 54 U/L (ref 29–143)

## 2017-04-21 LAB — SEDIMENTATION RATE: Sed Rate: 10 mm/hr (ref 0–30)

## 2017-05-01 DIAGNOSIS — I509 Heart failure, unspecified: Secondary | ICD-10-CM | POA: Diagnosis not present

## 2017-05-01 DIAGNOSIS — Z713 Dietary counseling and surveillance: Secondary | ICD-10-CM | POA: Diagnosis not present

## 2017-05-01 DIAGNOSIS — Z6835 Body mass index (BMI) 35.0-35.9, adult: Secondary | ICD-10-CM | POA: Diagnosis not present

## 2017-05-01 DIAGNOSIS — I2699 Other pulmonary embolism without acute cor pulmonale: Secondary | ICD-10-CM | POA: Diagnosis not present

## 2017-05-01 DIAGNOSIS — Z299 Encounter for prophylactic measures, unspecified: Secondary | ICD-10-CM | POA: Diagnosis not present

## 2017-05-04 ENCOUNTER — Other Ambulatory Visit: Payer: Self-pay | Admitting: Cardiovascular Disease

## 2017-05-25 ENCOUNTER — Ambulatory Visit (INDEPENDENT_AMBULATORY_CARE_PROVIDER_SITE_OTHER): Payer: Medicare Other | Admitting: Cardiovascular Disease

## 2017-05-25 ENCOUNTER — Encounter: Payer: Self-pay | Admitting: Cardiovascular Disease

## 2017-05-25 VITALS — BP 142/74 | HR 80 | Ht 60.0 in | Wt 192.0 lb

## 2017-05-25 DIAGNOSIS — Z86711 Personal history of pulmonary embolism: Secondary | ICD-10-CM

## 2017-05-25 DIAGNOSIS — I5022 Chronic systolic (congestive) heart failure: Secondary | ICD-10-CM | POA: Diagnosis not present

## 2017-05-25 DIAGNOSIS — I482 Chronic atrial fibrillation, unspecified: Secondary | ICD-10-CM

## 2017-05-25 DIAGNOSIS — I10 Essential (primary) hypertension: Secondary | ICD-10-CM | POA: Diagnosis not present

## 2017-05-25 DIAGNOSIS — I429 Cardiomyopathy, unspecified: Secondary | ICD-10-CM | POA: Diagnosis not present

## 2017-05-25 DIAGNOSIS — D6859 Other primary thrombophilia: Secondary | ICD-10-CM | POA: Diagnosis not present

## 2017-05-25 NOTE — Progress Notes (Signed)
SUBJECTIVE: The patient resents her follow-up of chronic systolic heart failure, chronic atrial fibrillation, and mitral regurgitation.  Echocardiogram performed on 05/14/16 reportedly demonstrated severely reduced left ventricular systolic function, LVEF 30-35%, mild LVH, mild mitral and tricuspid regurgitation, moderate left atrial and mild right atrial enlargement, and mildly elevated pulmonary pressures.  Nuclear stress test 06/11/16 did not show any large ischemic territories. There was a small, mild intensity, apical anterior defect that was partially reversible and thought related to attenuation artifact versus a minor region of ischemia.  She has a history of protein S deficiency and pulmonary embolism. She has venous varicosities.   She denies any recent hospitalizations. She also denies chest pain, shortness of breath, orthopnea, and PND. She has chronic lower extremity edema. It is not been bothersome to her. She said she has a good appetite.  She said blood pressures run normally 90/40s at home. She seldom has lightheadedness and dizziness and denies syncope altogether.  Blood pressure in our office is 142/74.  ECG performed in our office today which I ordered and personally interpreted demonstrated rate controlled atrial fibrillation, 76 bpm.  Review of Systems: As per "subjective", otherwise negative.  Allergies  Allergen Reactions  . Benadryl [Diphenhydramine Hcl (Sleep)] Other (See Comments)    "Makes me wild acting"  . Penicillins     rash  . Streptomycin Sulfate [Streptomycin]     rash    Current Outpatient Prescriptions  Medication Sig Dispense Refill  . calcium carbonate (OS-CAL) 600 MG TABS tablet Take 1 tablet by mouth 2 (two) times daily.    . carvedilol (COREG) 12.5 MG tablet Take 1 tablet by mouth 2 (two) times daily.    . cetirizine (ZYRTEC) 10 MG tablet Take 1 tablet by mouth daily.    Marland Kitchen docusate sodium (COLACE) 100 MG capsule Take 1 capsule by mouth  3 (three) times daily as needed.    Marland Kitchen ENTRESTO 24-26 MG TAKE 1 TABLET BY MOUTH TWICE DAILY 60 tablet 2  . furosemide (LASIX) 40 MG tablet Take 40 mg by mouth daily.     Marland Kitchen glimepiride (AMARYL) 2 MG tablet Take 1 tablet by mouth daily with breakfast.     . metFORMIN (GLUCOPHAGE) 500 MG tablet Take 1,000 mg by mouth 2 (two) times daily.     . Multiple Vitamins-Minerals (MULTIVITAMIN & MINERAL PO) Take 1 tablet by mouth daily.    . potassium chloride (K-DUR,KLOR-CON) 10 MEQ tablet Take 10 mEq by mouth daily.    Marland Kitchen spironolactone (ALDACTONE) 25 MG tablet Take 0.5 tablets by mouth daily.    Marland Kitchen warfarin (COUMADIN) 2.5 MG tablet Take by mouth.     No current facility-administered medications for this visit.     Past Medical History:  Diagnosis Date  . CHF (congestive heart failure) (HCC)   . Diabetes mellitus without complication (HCC)   . Dyslipidemia     Past Surgical History:  Procedure Laterality Date  . BREAST BIOPSY    . CATARACT EXTRACTION      Social History   Social History  . Marital status: Widowed    Spouse name: N/A  . Number of children: N/A  . Years of education: N/A   Occupational History  . Not on file.   Social History Main Topics  . Smoking status: Never Smoker  . Smokeless tobacco: Never Used  . Alcohol use No  . Drug use: No  . Sexual activity: Not on file   Other Topics Concern  .  Not on file   Social History Narrative  . No narrative on file     Vitals:   05/25/17 1453  BP: (!) 142/74  Pulse: 80  SpO2: 98%  Weight: 192 lb (87.1 kg)  Height: 5' (1.524 m)    Wt Readings from Last 3 Encounters:  05/25/17 192 lb (87.1 kg)  04/20/17 186 lb (84.4 kg)  11/24/16 197 lb (89.4 kg)     PHYSICAL EXAM General: NAD HEENT: Normal. Neck: No JVD, no thyromegaly. Lungs: Clear to auscultation bilaterally with normal respiratory effort. CV: Nondisplaced PMI.  Regular rate and irregular rhythm, normal S1/S2, no S3, no murmur. 1+ pitting left pretibial  and trace right pretibial edema.  Right knee brace. Abdomen: Soft, nontender, no distention.  Neurologic: Alert and oriented.  Psych: Normal affect. Skin: Normal.    ECG: Most recent ECG reviewed.   Labs: Lab Results  Component Value Date/Time   K 4.4 04/20/2017 03:05 PM   BUN 30 (H) 04/20/2017 03:05 PM   CREATININE 1.27 (H) 04/20/2017 03:05 PM   ALT 13 04/20/2017 03:05 PM   HGB 11.5 (L) 04/20/2017 03:05 PM     Lipids: No results found for: LDLCALC, LDLDIRECT, CHOL, TRIG, HDL     ASSESSMENT AND PLAN:  1. Chronic systolic heart failure: NYHA class II symptoms. Currently on carvedilol 12.5 mg twice daily, Lasix 40 mg daily, Entresto, and spironolactone 12.5 mg daily. No changes.  2. Chronic atrial fibrillation: Anticoagulated with warfarin. HR controlled with Coreg.  3. Essential HTN: Mildly elevated. Low blood pressure readings at home. I have asked her to bring in her cuff so we can make certain it is appropriately calibrated.   4. Pulmonary embolism/protein S deficiency: Continue warfarin.     Disposition: Follow up 6 months   Prentice Docker, M.D., F.A.C.C.

## 2017-05-25 NOTE — Patient Instructions (Signed)
Medication Instructions:  Continue all current medications.  Labwork: none  Testing/Procedures: none  Follow-Up: Your physician wants you to follow up in: 6 months.  You will receive a reminder letter in the mail one-two months in advance.  If you don't receive a letter, please call our office to schedule the follow up appointment   Any Other Special Instructions Will Be Listed Below (If Applicable). Nurse visit for BP check - patient to bring her machine for comparison.  If you need a refill on your cardiac medications before your next appointment, please call your pharmacy.

## 2017-06-02 ENCOUNTER — Ambulatory Visit (INDEPENDENT_AMBULATORY_CARE_PROVIDER_SITE_OTHER): Payer: Medicare Other | Admitting: *Deleted

## 2017-06-02 DIAGNOSIS — I10 Essential (primary) hypertension: Secondary | ICD-10-CM

## 2017-06-02 NOTE — Progress Notes (Signed)
Pt here for BP monitor comparison per LOV with Dr Purvis Sheffield - pt brought both BP monitors from home - pt denies any complaints today - Pt BP monitor 135/84 HR 72 - Nurse manual check 120/68 HR 78 - (this was after resting for 5 minutes between checks - gave pt BP log for home and pt will continue to check readings - routed to Dr Purvis Sheffield

## 2017-06-18 DIAGNOSIS — Z6835 Body mass index (BMI) 35.0-35.9, adult: Secondary | ICD-10-CM | POA: Diagnosis not present

## 2017-06-18 DIAGNOSIS — I10 Essential (primary) hypertension: Secondary | ICD-10-CM | POA: Diagnosis not present

## 2017-06-18 DIAGNOSIS — I2699 Other pulmonary embolism without acute cor pulmonale: Secondary | ICD-10-CM | POA: Diagnosis not present

## 2017-06-18 DIAGNOSIS — E1165 Type 2 diabetes mellitus with hyperglycemia: Secondary | ICD-10-CM | POA: Diagnosis not present

## 2017-06-18 DIAGNOSIS — I509 Heart failure, unspecified: Secondary | ICD-10-CM | POA: Diagnosis not present

## 2017-06-18 DIAGNOSIS — Z299 Encounter for prophylactic measures, unspecified: Secondary | ICD-10-CM | POA: Diagnosis not present

## 2017-06-25 ENCOUNTER — Telehealth: Payer: Self-pay | Admitting: *Deleted

## 2017-06-25 NOTE — Telephone Encounter (Signed)
See BP log scanned into EPIC.  Per Dr. Purvis Sheffield review - continue all medications the same.  No changes at this time.    Patient notified and verbalized understanding.

## 2017-07-16 DIAGNOSIS — E669 Obesity, unspecified: Secondary | ICD-10-CM | POA: Diagnosis not present

## 2017-07-16 DIAGNOSIS — I429 Cardiomyopathy, unspecified: Secondary | ICD-10-CM | POA: Diagnosis not present

## 2017-07-16 DIAGNOSIS — I5020 Unspecified systolic (congestive) heart failure: Secondary | ICD-10-CM | POA: Diagnosis not present

## 2017-07-16 DIAGNOSIS — Z299 Encounter for prophylactic measures, unspecified: Secondary | ICD-10-CM | POA: Diagnosis not present

## 2017-07-16 DIAGNOSIS — I10 Essential (primary) hypertension: Secondary | ICD-10-CM | POA: Diagnosis not present

## 2017-07-16 DIAGNOSIS — I255 Ischemic cardiomyopathy: Secondary | ICD-10-CM | POA: Diagnosis not present

## 2017-07-16 DIAGNOSIS — I2699 Other pulmonary embolism without acute cor pulmonale: Secondary | ICD-10-CM | POA: Diagnosis not present

## 2017-07-16 DIAGNOSIS — E1165 Type 2 diabetes mellitus with hyperglycemia: Secondary | ICD-10-CM | POA: Diagnosis not present

## 2017-08-02 ENCOUNTER — Other Ambulatory Visit: Payer: Self-pay | Admitting: Cardiovascular Disease

## 2017-08-13 DIAGNOSIS — I10 Essential (primary) hypertension: Secondary | ICD-10-CM | POA: Diagnosis not present

## 2017-08-13 DIAGNOSIS — I255 Ischemic cardiomyopathy: Secondary | ICD-10-CM | POA: Diagnosis not present

## 2017-08-13 DIAGNOSIS — I5020 Unspecified systolic (congestive) heart failure: Secondary | ICD-10-CM | POA: Diagnosis not present

## 2017-08-13 DIAGNOSIS — Z299 Encounter for prophylactic measures, unspecified: Secondary | ICD-10-CM | POA: Diagnosis not present

## 2017-08-13 DIAGNOSIS — I2699 Other pulmonary embolism without acute cor pulmonale: Secondary | ICD-10-CM | POA: Diagnosis not present

## 2017-08-13 DIAGNOSIS — E669 Obesity, unspecified: Secondary | ICD-10-CM | POA: Diagnosis not present

## 2017-08-13 DIAGNOSIS — Z6835 Body mass index (BMI) 35.0-35.9, adult: Secondary | ICD-10-CM | POA: Diagnosis not present

## 2017-08-13 DIAGNOSIS — Z23 Encounter for immunization: Secondary | ICD-10-CM | POA: Diagnosis not present

## 2017-08-13 DIAGNOSIS — E1165 Type 2 diabetes mellitus with hyperglycemia: Secondary | ICD-10-CM | POA: Diagnosis not present

## 2017-08-13 DIAGNOSIS — I429 Cardiomyopathy, unspecified: Secondary | ICD-10-CM | POA: Diagnosis not present

## 2017-08-14 DIAGNOSIS — E7800 Pure hypercholesterolemia, unspecified: Secondary | ICD-10-CM | POA: Diagnosis not present

## 2017-08-14 DIAGNOSIS — E119 Type 2 diabetes mellitus without complications: Secondary | ICD-10-CM | POA: Diagnosis not present

## 2017-08-14 DIAGNOSIS — I10 Essential (primary) hypertension: Secondary | ICD-10-CM | POA: Diagnosis not present

## 2017-08-14 DIAGNOSIS — I509 Heart failure, unspecified: Secondary | ICD-10-CM | POA: Diagnosis not present

## 2017-09-10 NOTE — Progress Notes (Signed)
Office Visit Note  Patient: Rebecca Hanson             Date of Birth: [birth year]           MRN: [id number]             PCP: Ignatius Specking, MD Referring: Ignatius Specking, MD Visit Date: 09/22/2017 Occupation: @GUAROCC @    Subjective:  Pain in right leg and shoulders.   History of Present Illness: Rebecca Hanson is a 83 y.o. female with history of polymyalgia and osteoarthritis. She states she's been having pain in her bilateral shoulders and right lower extremity. She states her right knee has been very painful. She's having difficulty with mobility.  Activities of Daily Living:  Patient reports morning stiffness for all day hours.   Patient Denies nocturnal pain.  Difficulty dressing/grooming: Denies Difficulty climbing stairs: reports Difficulty getting out of chair: Reports Difficulty using hands for taps, buttons, cutlery, and/or writing: Reports   Review of Systems  Constitutional: Positive for fatigue. Negative for night sweats, weight gain, weight loss and weakness.  HENT: Negative for mouth sores, trouble swallowing, trouble swallowing, mouth dryness and nose dryness.   Eyes: Negative for pain, redness, visual disturbance and dryness.  Respiratory: Negative for cough, shortness of breath and difficulty breathing.   Cardiovascular: Positive for hypertension. Negative for chest pain, palpitations, irregular heartbeat and swelling in legs/feet.  Gastrointestinal: Negative for blood in stool, constipation and diarrhea.  Endocrine: Negative for increased urination.  Genitourinary: Negative for vaginal dryness.  Musculoskeletal: Positive for arthralgias, joint pain, myalgias, morning stiffness and myalgias. Negative for joint swelling, muscle weakness and muscle tenderness.  Skin: Negative for color change, rash, hair loss, skin tightness, ulcers and sensitivity to sunlight.  Allergic/Immunologic: Negative for susceptible to infections.  Neurological: Negative for  dizziness, memory loss and night sweats.  Hematological: Negative for swollen glands.  Psychiatric/Behavioral: Negative for depressed mood and sleep disturbance. The patient is not nervous/anxious.     PMFS History:  Patient Active Problem List   Diagnosis Date Noted  . Polymyalgia rheumatica (HCC) 11/19/2016  . Primary osteoarthritis of both knees 11/19/2016  . Primary osteoarthritis of both hands 11/19/2016  . Pseudogout 11/19/2016  . Essential hypertension 11/19/2016  . Diabetes mellitus 11/19/2016  . History of DVT (deep vein thrombosis) 11/19/2016  . History of pulmonary embolism 11/19/2016  . History of congestive heart failure 11/19/2016    Past Medical History:  Diagnosis Date  . CHF (congestive heart failure) (HCC)   . Diabetes mellitus without complication (HCC)   . Dyslipidemia     Family History  Problem Relation Age of Onset  . Heart disease Father   . CAD Father   . CVA Father    Past Surgical History:  Procedure Laterality Date  . BREAST BIOPSY    . CATARACT EXTRACTION     Social History   Social History Narrative  . Not on file     Objective: Vital Signs: BP (!) 163/100 (BP Location: Left Arm, Patient Position: Sitting, Cuff Size: Small)   Pulse 86   Resp 14   Ht 5' (1.524 m)   Wt 193 lb (87.5 kg)   BMI 37.69 kg/m    Physical Exam  Constitutional: She is oriented to person, place, and time. She appears well-developed and well-nourished.  HENT:  Head: Normocephalic and atraumatic.  Eyes: Conjunctivae and EOM are normal.  Neck: Normal range of motion.  Cardiovascular: Normal rate, regular rhythm, normal heart sounds and intact  distal pulses.  Pulmonary/Chest: Effort normal and breath sounds normal.  Abdominal: Soft. Bowel sounds are normal.  Lymphadenopathy:    She has no cervical adenopathy.  Neurological: She is alert and oriented to person, place, and time.  Skin: Skin is warm and dry. Capillary refill takes less than 2 seconds.    Psychiatric: She has a normal mood and affect. Her behavior is normal.  Nursing note and vitals reviewed.    Musculoskeletal Exam: C-spine and thoracic lumbar spine limited range of motion. She is painful range of motion of her right shoulder joint. Elbow joints wrist joints are good range of motion. She has DIP PIP thickening in her hands consistent with osteoarthritis. She is crepitus in her knee joints without any warmth swelling or effusion.  CDAI Exam: No CDAI exam completed.    Investigation: No additional findings. CBC Latest Ref Rng & Units 04/20/2017  WBC 3.8 - 10.8 K/uL 5.0  Hemoglobin 11.7 - 15.5 g/dL 11.5(L)  Hematocrit 35.0 - 45.0 % 35.4  Platelets 140 - 400 K/uL 140   CMP Latest Ref Rng & Units 04/20/2017  Glucose 65 - 99 mg/dL 99  BUN 7 - 25 mg/dL 17(E)  Creatinine 0.81 - 0.88 mg/dL 4.48(J)  Sodium 856 - 314 mmol/L 139  Potassium 3.5 - 5.3 mmol/L 4.4  Chloride 98 - 110 mmol/L 104  CO2 20 - 31 mmol/L 22  Calcium 8.6 - 10.4 mg/dL 9.7  Total Protein 6.1 - 8.1 g/dL 6.6  Total Bilirubin 0.2 - 1.2 mg/dL 0.5  Alkaline Phos 33 - 130 U/L 57  AST 10 - 35 U/L 20  ALT 6 - 29 U/L 13    Imaging: No results found.  Speciality Comments: No specialty comments available.    Procedures:  No procedures performed Allergies: Benadryl [diphenhydramine hcl (sleep)]; Penicillins; and Streptomycin sulfate [streptomycin]   Assessment / Plan:     Visit Diagnoses: Polymyalgia rheumatica (HCC): Patient has no proximal or distal muscle weakness.  PMR appears to be in remission.  Pain right shoulder:she has some limitation with range of motion. We decided against cortisone injection as she is diabetic. She has done physical therapy in the past which was helpful. She wants to hold off physical therapy until  next summer.  Primary osteoarthritis of both hands: She has some stiffness in her hands.Joint protection was discussed.  Primary osteoarthritis of both knees: She continues to  have pain and discomfort in her knee joints. She has failed Visco supplement injections in the past. She cannot have cortisone injection  as she is diabetic.  Pseudogout: She denies any recent flare.  Other medical problems are listed as follows:  History of diabetes mellitus  History of DVT (deep vein thrombosis)  History of pulmonary embolism - 2002  History of hypertension: Her blood pressure is elevated today. I've advised her to see her PCP. Her repeat blood pressure was 146/80 after resting.  History of congestive heart failure - 2016    Orders: No orders of the defined types were placed in this encounter.  No orders of the defined types were placed in this encounter.   Follow-Up Instructions: Return in about 1 year (around 09/22/2018) for PMR OA  Pseudogout.   Pollyann Savoy, MD  Note - This record has been created using [employer].  Chart creation errors have been sought, but may not always  have been located. Such creation errors do not reflect on  the standard of medical care.

## 2017-09-11 DIAGNOSIS — E119 Type 2 diabetes mellitus without complications: Secondary | ICD-10-CM | POA: Diagnosis not present

## 2017-09-11 DIAGNOSIS — I10 Essential (primary) hypertension: Secondary | ICD-10-CM | POA: Diagnosis not present

## 2017-09-11 DIAGNOSIS — E7800 Pure hypercholesterolemia, unspecified: Secondary | ICD-10-CM | POA: Diagnosis not present

## 2017-09-11 DIAGNOSIS — I509 Heart failure, unspecified: Secondary | ICD-10-CM | POA: Diagnosis not present

## 2017-09-22 ENCOUNTER — Ambulatory Visit (INDEPENDENT_AMBULATORY_CARE_PROVIDER_SITE_OTHER): Payer: Medicare Other | Admitting: Rheumatology

## 2017-09-22 ENCOUNTER — Encounter: Payer: Self-pay | Admitting: Rheumatology

## 2017-09-22 VITALS — BP 146/80 | HR 73 | Resp 14 | Ht 60.0 in | Wt 193.0 lb

## 2017-09-22 DIAGNOSIS — Z8639 Personal history of other endocrine, nutritional and metabolic disease: Secondary | ICD-10-CM

## 2017-09-22 DIAGNOSIS — Z86711 Personal history of pulmonary embolism: Secondary | ICD-10-CM | POA: Diagnosis not present

## 2017-09-22 DIAGNOSIS — M19041 Primary osteoarthritis, right hand: Secondary | ICD-10-CM

## 2017-09-22 DIAGNOSIS — Z86718 Personal history of other venous thrombosis and embolism: Secondary | ICD-10-CM

## 2017-09-22 DIAGNOSIS — M353 Polymyalgia rheumatica: Secondary | ICD-10-CM

## 2017-09-22 DIAGNOSIS — M25511 Pain in right shoulder: Secondary | ICD-10-CM | POA: Diagnosis not present

## 2017-09-22 DIAGNOSIS — Z8679 Personal history of other diseases of the circulatory system: Secondary | ICD-10-CM | POA: Diagnosis not present

## 2017-09-22 DIAGNOSIS — M170 Bilateral primary osteoarthritis of knee: Secondary | ICD-10-CM

## 2017-09-22 DIAGNOSIS — G8929 Other chronic pain: Secondary | ICD-10-CM

## 2017-09-22 DIAGNOSIS — M19042 Primary osteoarthritis, left hand: Secondary | ICD-10-CM | POA: Diagnosis not present

## 2017-09-22 DIAGNOSIS — M1120 Other chondrocalcinosis, unspecified site: Secondary | ICD-10-CM | POA: Diagnosis not present

## 2017-09-24 DIAGNOSIS — Z713 Dietary counseling and surveillance: Secondary | ICD-10-CM | POA: Diagnosis not present

## 2017-09-24 DIAGNOSIS — Z299 Encounter for prophylactic measures, unspecified: Secondary | ICD-10-CM | POA: Diagnosis not present

## 2017-09-24 DIAGNOSIS — I10 Essential (primary) hypertension: Secondary | ICD-10-CM | POA: Diagnosis not present

## 2017-09-24 DIAGNOSIS — E1165 Type 2 diabetes mellitus with hyperglycemia: Secondary | ICD-10-CM | POA: Diagnosis not present

## 2017-09-24 DIAGNOSIS — Z6835 Body mass index (BMI) 35.0-35.9, adult: Secondary | ICD-10-CM | POA: Diagnosis not present

## 2017-10-02 DIAGNOSIS — Z299 Encounter for prophylactic measures, unspecified: Secondary | ICD-10-CM | POA: Diagnosis not present

## 2017-10-02 DIAGNOSIS — I2699 Other pulmonary embolism without acute cor pulmonale: Secondary | ICD-10-CM | POA: Diagnosis not present

## 2017-10-02 DIAGNOSIS — Z6835 Body mass index (BMI) 35.0-35.9, adult: Secondary | ICD-10-CM | POA: Diagnosis not present

## 2017-10-02 DIAGNOSIS — I509 Heart failure, unspecified: Secondary | ICD-10-CM | POA: Diagnosis not present

## 2017-10-02 DIAGNOSIS — Z713 Dietary counseling and surveillance: Secondary | ICD-10-CM | POA: Diagnosis not present

## 2017-10-30 DIAGNOSIS — I429 Cardiomyopathy, unspecified: Secondary | ICD-10-CM | POA: Diagnosis not present

## 2017-10-30 DIAGNOSIS — I2699 Other pulmonary embolism without acute cor pulmonale: Secondary | ICD-10-CM | POA: Diagnosis not present

## 2017-10-30 DIAGNOSIS — Z6834 Body mass index (BMI) 34.0-34.9, adult: Secondary | ICD-10-CM | POA: Diagnosis not present

## 2017-10-30 DIAGNOSIS — I5020 Unspecified systolic (congestive) heart failure: Secondary | ICD-10-CM | POA: Diagnosis not present

## 2017-10-30 DIAGNOSIS — J069 Acute upper respiratory infection, unspecified: Secondary | ICD-10-CM | POA: Diagnosis not present

## 2017-10-30 DIAGNOSIS — Z299 Encounter for prophylactic measures, unspecified: Secondary | ICD-10-CM | POA: Diagnosis not present

## 2017-11-19 DIAGNOSIS — Z299 Encounter for prophylactic measures, unspecified: Secondary | ICD-10-CM | POA: Diagnosis not present

## 2017-11-19 DIAGNOSIS — Z79899 Other long term (current) drug therapy: Secondary | ICD-10-CM | POA: Diagnosis not present

## 2017-11-19 DIAGNOSIS — Z1211 Encounter for screening for malignant neoplasm of colon: Secondary | ICD-10-CM | POA: Diagnosis not present

## 2017-11-19 DIAGNOSIS — I5020 Unspecified systolic (congestive) heart failure: Secondary | ICD-10-CM | POA: Diagnosis not present

## 2017-11-19 DIAGNOSIS — Z0000 Encounter for general adult medical examination without abnormal findings: Secondary | ICD-10-CM | POA: Diagnosis not present

## 2017-11-19 DIAGNOSIS — I429 Cardiomyopathy, unspecified: Secondary | ICD-10-CM | POA: Diagnosis not present

## 2017-11-19 DIAGNOSIS — R5383 Other fatigue: Secondary | ICD-10-CM | POA: Diagnosis not present

## 2017-11-19 DIAGNOSIS — Z1331 Encounter for screening for depression: Secondary | ICD-10-CM | POA: Diagnosis not present

## 2017-11-19 DIAGNOSIS — Z1339 Encounter for screening examination for other mental health and behavioral disorders: Secondary | ICD-10-CM | POA: Diagnosis not present

## 2017-11-19 DIAGNOSIS — Z7189 Other specified counseling: Secondary | ICD-10-CM | POA: Diagnosis not present

## 2017-11-19 DIAGNOSIS — I10 Essential (primary) hypertension: Secondary | ICD-10-CM | POA: Diagnosis not present

## 2017-11-19 DIAGNOSIS — E7800 Pure hypercholesterolemia, unspecified: Secondary | ICD-10-CM | POA: Diagnosis not present

## 2017-11-19 DIAGNOSIS — Z6834 Body mass index (BMI) 34.0-34.9, adult: Secondary | ICD-10-CM | POA: Diagnosis not present

## 2017-11-27 DIAGNOSIS — Z713 Dietary counseling and surveillance: Secondary | ICD-10-CM | POA: Diagnosis not present

## 2017-11-27 DIAGNOSIS — Z6834 Body mass index (BMI) 34.0-34.9, adult: Secondary | ICD-10-CM | POA: Diagnosis not present

## 2017-11-27 DIAGNOSIS — I5020 Unspecified systolic (congestive) heart failure: Secondary | ICD-10-CM | POA: Diagnosis not present

## 2017-11-27 DIAGNOSIS — Z299 Encounter for prophylactic measures, unspecified: Secondary | ICD-10-CM | POA: Diagnosis not present

## 2017-11-27 DIAGNOSIS — I2699 Other pulmonary embolism without acute cor pulmonale: Secondary | ICD-10-CM | POA: Diagnosis not present

## 2017-12-03 ENCOUNTER — Other Ambulatory Visit: Payer: Self-pay | Admitting: Cardiovascular Disease

## 2017-12-03 ENCOUNTER — Encounter: Payer: Self-pay | Admitting: Cardiovascular Disease

## 2017-12-03 ENCOUNTER — Ambulatory Visit (INDEPENDENT_AMBULATORY_CARE_PROVIDER_SITE_OTHER): Payer: Medicare Other | Admitting: Cardiovascular Disease

## 2017-12-03 VITALS — BP 118/68 | HR 87 | Ht 62.0 in | Wt 191.0 lb

## 2017-12-03 DIAGNOSIS — I10 Essential (primary) hypertension: Secondary | ICD-10-CM

## 2017-12-03 DIAGNOSIS — I482 Chronic atrial fibrillation, unspecified: Secondary | ICD-10-CM

## 2017-12-03 DIAGNOSIS — I429 Cardiomyopathy, unspecified: Secondary | ICD-10-CM | POA: Diagnosis not present

## 2017-12-03 DIAGNOSIS — D6859 Other primary thrombophilia: Secondary | ICD-10-CM | POA: Diagnosis not present

## 2017-12-03 DIAGNOSIS — I5022 Chronic systolic (congestive) heart failure: Secondary | ICD-10-CM | POA: Diagnosis not present

## 2017-12-03 DIAGNOSIS — Z86711 Personal history of pulmonary embolism: Secondary | ICD-10-CM

## 2017-12-03 NOTE — Patient Instructions (Signed)
Medication Instructions:   Continue the Lasix 80mg  daily thru Saturday, 12/05/2017.  Decrease back to base dose of Lasix 40mg  daily on Sunday, 12/06/2017.    Weigh daily - you may take an extra 40mg  for 3 pound weight gain in a 24 hour time frame.   Continue all other medications.    Labwork:  BMET - order given today.  Please do on Monday, 12/07/2017.  Office will contact with results via phone or letter.    Testing/Procedures: none  Follow-Up: Your physician wants you to follow up in: 6 months.  You will receive a reminder letter in the mail one-two months in advance.  If you don't receive a letter, please call our office to schedule the follow up appointment   Any Other Special Instructions Will Be Listed Below (If Applicable).  If you need a refill on your cardiac medications before your next appointment, please call your pharmacy.

## 2017-12-03 NOTE — Progress Notes (Signed)
SUBJECTIVE: The patient presents for routine follow-up of chronic systolic heart failure, chronic atrial fibrillation, and mitral regurgitation.  Echocardiogram performed on 05/14/16 reportedly demonstrated severely reduced left ventricular systolic function, LVEF 30-35%, mild LVH, mild mitral and tricuspid regurgitation, moderate left atrial and mild right atrial enlargement, and mildly elevated pulmonary pressures.  Nuclear stress test 06/11/16 did not show any large ischemic territories. There was a small, mild intensity, apical anterior defect that was partially reversible and thought related to attenuation artifact versus a minor region of ischemia.  She has a history of protein S deficiency and pulmonary embolism. She has venous varicosities.   She has had issues with right knee pain and arthritis.  She continues to wear a brace.  She has had some bilateral leg swelling over the past 2 days.  Her PCP instructed her to increase Lasix to 80 mg daily. She denies shortness of breath and chest pain.  On 04/20/17, BUN 30, creatinine 1.27.      Review of Systems: As per "subjective", otherwise negative.  Allergies  Allergen Reactions  . Benadryl [Diphenhydramine Hcl (Sleep)] Other (See Comments)    "Makes me wild acting"  . Penicillins     rash  . Streptomycin Sulfate [Streptomycin]     rash    Current Outpatient Medications  Medication Sig Dispense Refill  . calcium carbonate (OS-CAL) 600 MG TABS tablet Take 1 tablet by mouth 2 (two) times daily.    . carvedilol (COREG) 12.5 MG tablet Take 1 tablet by mouth 2 (two) times daily.    . cetirizine (ZYRTEC) 10 MG tablet Take 1 tablet by mouth daily.    Marland Kitchen docusate sodium (COLACE) 100 MG capsule Take 1 capsule by mouth 3 (three) times daily as needed.    Marland Kitchen ENTRESTO 24-26 MG TAKE ONE TABLET BY MOUTH TWICE DAILY 60 tablet 6  . FLUZONE QUADRIVALENT injection See admin instructions.  0  . furosemide (LASIX) 40 MG tablet Take 40 mg by  mouth daily.     Marland Kitchen glimepiride (AMARYL) 2 MG tablet Take 1 tablet by mouth daily with breakfast.     . metFORMIN (GLUCOPHAGE) 500 MG tablet Take 1,000 mg by mouth 2 (two) times daily.     . Multiple Vitamins-Minerals (MULTIVITAMIN & MINERAL PO) Take 1 tablet by mouth daily.    . potassium chloride (K-DUR,KLOR-CON) 10 MEQ tablet Take 10 mEq by mouth daily.    . rosuvastatin (CRESTOR) 5 MG tablet Take 5 mg by mouth daily at 6 PM.    . spironolactone (ALDACTONE) 25 MG tablet Take 0.5 tablets by mouth daily.    Marland Kitchen warfarin (COUMADIN) 2.5 MG tablet Take by mouth.     No current facility-administered medications for this visit.     Past Medical History:  Diagnosis Date  . CHF (congestive heart failure) (HCC)   . Diabetes mellitus without complication (HCC)   . Dyslipidemia     Past Surgical History:  Procedure Laterality Date  . BREAST BIOPSY    . CATARACT EXTRACTION      Social History   Socioeconomic History  . Marital status: Widowed    Spouse name: Not on file  . Number of children: Not on file  . Years of education: Not on file  . Highest education level: Not on file  Social Needs  . Financial resource strain: Not on file  . Food insecurity - worry: Not on file  . Food insecurity - inability: Not on file  .  Transportation needs - medical: Not on file  . Transportation needs - non-medical: Not on file  Occupational History  . Not on file  Tobacco Use  . Smoking status: Never Smoker  . Smokeless tobacco: Never Used  Substance and Sexual Activity  . Alcohol use: No    Alcohol/week: 0.0 oz  . Drug use: No  . Sexual activity: Not on file  Other Topics Concern  . Not on file  Social History Narrative  . Not on file     Vitals:   12/03/17 1402  BP: 118/68  Pulse: 87  SpO2: 98%  Weight: 191 lb (86.6 kg)  Height: 5\' 2"  (1.575 m)    Wt Readings from Last 3 Encounters:  12/03/17 191 lb (86.6 kg)  09/22/17 193 lb (87.5 kg)  05/25/17 192 lb (87.1 kg)      PHYSICAL EXAM General: NAD HEENT: Normal. Neck: No JVD, no thyromegaly. Lungs: Clear to auscultation bilaterally with normal respiratory effort. CV: Regular rate and irregular rhythm, normal S1/S2, no S3, no murmur.  1+ pitting bilateral lower extremity edema.  Right knee is in brace. Abdomen: Soft, nontender, no distention.  Neurologic: Alert and oriented.  Psych: Normal affect. Skin: Normal. Musculoskeletal: No gross deformities.    ECG: Most recent ECG reviewed.   Labs: Lab Results  Component Value Date/Time   K 4.4 04/20/2017 03:05 PM   BUN 30 (H) 04/20/2017 03:05 PM   CREATININE 1.27 (H) 04/20/2017 03:05 PM   ALT 13 04/20/2017 03:05 PM   HGB 11.5 (L) 04/20/2017 03:05 PM     Lipids: No results found for: LDLCALC, LDLDIRECT, CHOL, TRIG, HDL     ASSESSMENT AND PLAN:  1. Chronic systolic heart failure with bilateral lower extremity edema: This is presumably nonischemic in etiology.  NYHA class II symptoms.  Weight appears to be stable.  Currently on carvedilol 12.5 mg twice daily, Entresto 24/26 mg twice daily, and spironolactone 12.5 mg daily.  She has been on Lasix 80 mg daily since yesterday.  I told her to continue this through 12/05/17, and then to reduce back to 40 mg daily.  I will obtain a basic metabolic panel on 12/07/17.  2. Chronic atrial fibrillation: Anticoagulated with warfarin. HR controlled with Coreg.  3. Essential HTN:  Controlled.  No changes.  4. Pulmonary embolism/protein S deficiency: Continue warfarin.     Disposition: Follow up 6 months   12/09/17, M.D., F.A.C.C.

## 2017-12-07 DIAGNOSIS — I5022 Chronic systolic (congestive) heart failure: Secondary | ICD-10-CM | POA: Diagnosis not present

## 2017-12-07 DIAGNOSIS — I110 Hypertensive heart disease with heart failure: Secondary | ICD-10-CM | POA: Diagnosis not present

## 2017-12-28 DIAGNOSIS — H43812 Vitreous degeneration, left eye: Secondary | ICD-10-CM | POA: Diagnosis not present

## 2017-12-28 DIAGNOSIS — H26492 Other secondary cataract, left eye: Secondary | ICD-10-CM | POA: Diagnosis not present

## 2017-12-28 DIAGNOSIS — H40013 Open angle with borderline findings, low risk, bilateral: Secondary | ICD-10-CM | POA: Diagnosis not present

## 2017-12-28 DIAGNOSIS — E119 Type 2 diabetes mellitus without complications: Secondary | ICD-10-CM | POA: Diagnosis not present

## 2017-12-28 DIAGNOSIS — Z961 Presence of intraocular lens: Secondary | ICD-10-CM | POA: Diagnosis not present

## 2017-12-29 DIAGNOSIS — E2839 Other primary ovarian failure: Secondary | ICD-10-CM | POA: Diagnosis not present

## 2018-01-07 DIAGNOSIS — I429 Cardiomyopathy, unspecified: Secondary | ICD-10-CM | POA: Diagnosis not present

## 2018-01-07 DIAGNOSIS — E1165 Type 2 diabetes mellitus with hyperglycemia: Secondary | ICD-10-CM | POA: Diagnosis not present

## 2018-01-07 DIAGNOSIS — J309 Allergic rhinitis, unspecified: Secondary | ICD-10-CM | POA: Diagnosis not present

## 2018-01-07 DIAGNOSIS — Z6836 Body mass index (BMI) 36.0-36.9, adult: Secondary | ICD-10-CM | POA: Diagnosis not present

## 2018-01-07 DIAGNOSIS — I2699 Other pulmonary embolism without acute cor pulmonale: Secondary | ICD-10-CM | POA: Diagnosis not present

## 2018-01-07 DIAGNOSIS — Z299 Encounter for prophylactic measures, unspecified: Secondary | ICD-10-CM | POA: Diagnosis not present

## 2018-02-03 DIAGNOSIS — I2699 Other pulmonary embolism without acute cor pulmonale: Secondary | ICD-10-CM | POA: Diagnosis not present

## 2018-02-03 DIAGNOSIS — Z6835 Body mass index (BMI) 35.0-35.9, adult: Secondary | ICD-10-CM | POA: Diagnosis not present

## 2018-02-03 DIAGNOSIS — Z713 Dietary counseling and surveillance: Secondary | ICD-10-CM | POA: Diagnosis not present

## 2018-02-17 DIAGNOSIS — I10 Essential (primary) hypertension: Secondary | ICD-10-CM | POA: Diagnosis not present

## 2018-02-17 DIAGNOSIS — E7800 Pure hypercholesterolemia, unspecified: Secondary | ICD-10-CM | POA: Diagnosis not present

## 2018-02-17 DIAGNOSIS — I509 Heart failure, unspecified: Secondary | ICD-10-CM | POA: Diagnosis not present

## 2018-02-17 DIAGNOSIS — E119 Type 2 diabetes mellitus without complications: Secondary | ICD-10-CM | POA: Diagnosis not present

## 2018-03-03 DIAGNOSIS — Z713 Dietary counseling and surveillance: Secondary | ICD-10-CM | POA: Diagnosis not present

## 2018-03-03 DIAGNOSIS — Z299 Encounter for prophylactic measures, unspecified: Secondary | ICD-10-CM | POA: Diagnosis not present

## 2018-03-03 DIAGNOSIS — M1710 Unilateral primary osteoarthritis, unspecified knee: Secondary | ICD-10-CM | POA: Diagnosis not present

## 2018-03-03 DIAGNOSIS — I2699 Other pulmonary embolism without acute cor pulmonale: Secondary | ICD-10-CM | POA: Diagnosis not present

## 2018-03-03 DIAGNOSIS — Z6836 Body mass index (BMI) 36.0-36.9, adult: Secondary | ICD-10-CM | POA: Diagnosis not present

## 2018-03-25 DIAGNOSIS — E7800 Pure hypercholesterolemia, unspecified: Secondary | ICD-10-CM | POA: Diagnosis not present

## 2018-03-25 DIAGNOSIS — I509 Heart failure, unspecified: Secondary | ICD-10-CM | POA: Diagnosis not present

## 2018-03-25 DIAGNOSIS — E119 Type 2 diabetes mellitus without complications: Secondary | ICD-10-CM | POA: Diagnosis not present

## 2018-03-25 DIAGNOSIS — I10 Essential (primary) hypertension: Secondary | ICD-10-CM | POA: Diagnosis not present

## 2018-03-31 DIAGNOSIS — Z299 Encounter for prophylactic measures, unspecified: Secondary | ICD-10-CM | POA: Diagnosis not present

## 2018-03-31 DIAGNOSIS — I10 Essential (primary) hypertension: Secondary | ICD-10-CM | POA: Diagnosis not present

## 2018-03-31 DIAGNOSIS — I2699 Other pulmonary embolism without acute cor pulmonale: Secondary | ICD-10-CM | POA: Diagnosis not present

## 2018-03-31 DIAGNOSIS — E1165 Type 2 diabetes mellitus with hyperglycemia: Secondary | ICD-10-CM | POA: Diagnosis not present

## 2018-03-31 DIAGNOSIS — Z6835 Body mass index (BMI) 35.0-35.9, adult: Secondary | ICD-10-CM | POA: Diagnosis not present

## 2018-04-13 DIAGNOSIS — Z6835 Body mass index (BMI) 35.0-35.9, adult: Secondary | ICD-10-CM | POA: Diagnosis not present

## 2018-04-13 DIAGNOSIS — I429 Cardiomyopathy, unspecified: Secondary | ICD-10-CM | POA: Diagnosis not present

## 2018-04-13 DIAGNOSIS — I509 Heart failure, unspecified: Secondary | ICD-10-CM | POA: Diagnosis not present

## 2018-04-13 DIAGNOSIS — Z299 Encounter for prophylactic measures, unspecified: Secondary | ICD-10-CM | POA: Diagnosis not present

## 2018-04-13 DIAGNOSIS — I2699 Other pulmonary embolism without acute cor pulmonale: Secondary | ICD-10-CM | POA: Diagnosis not present

## 2018-04-13 DIAGNOSIS — I10 Essential (primary) hypertension: Secondary | ICD-10-CM | POA: Diagnosis not present

## 2018-04-13 DIAGNOSIS — E1165 Type 2 diabetes mellitus with hyperglycemia: Secondary | ICD-10-CM | POA: Diagnosis not present

## 2018-04-28 DIAGNOSIS — Z299 Encounter for prophylactic measures, unspecified: Secondary | ICD-10-CM | POA: Diagnosis not present

## 2018-04-28 DIAGNOSIS — Z6831 Body mass index (BMI) 31.0-31.9, adult: Secondary | ICD-10-CM | POA: Diagnosis not present

## 2018-04-28 DIAGNOSIS — E1165 Type 2 diabetes mellitus with hyperglycemia: Secondary | ICD-10-CM | POA: Diagnosis not present

## 2018-04-28 DIAGNOSIS — I509 Heart failure, unspecified: Secondary | ICD-10-CM | POA: Diagnosis not present

## 2018-04-28 DIAGNOSIS — I2699 Other pulmonary embolism without acute cor pulmonale: Secondary | ICD-10-CM | POA: Diagnosis not present

## 2018-04-28 DIAGNOSIS — I429 Cardiomyopathy, unspecified: Secondary | ICD-10-CM | POA: Diagnosis not present

## 2018-05-04 DIAGNOSIS — I10 Essential (primary) hypertension: Secondary | ICD-10-CM | POA: Diagnosis not present

## 2018-05-04 DIAGNOSIS — E119 Type 2 diabetes mellitus without complications: Secondary | ICD-10-CM | POA: Diagnosis not present

## 2018-05-04 DIAGNOSIS — I509 Heart failure, unspecified: Secondary | ICD-10-CM | POA: Diagnosis not present

## 2018-05-04 DIAGNOSIS — E7800 Pure hypercholesterolemia, unspecified: Secondary | ICD-10-CM | POA: Diagnosis not present

## 2018-05-27 DIAGNOSIS — E1165 Type 2 diabetes mellitus with hyperglycemia: Secondary | ICD-10-CM | POA: Diagnosis not present

## 2018-05-27 DIAGNOSIS — Z299 Encounter for prophylactic measures, unspecified: Secondary | ICD-10-CM | POA: Diagnosis not present

## 2018-05-27 DIAGNOSIS — Z6834 Body mass index (BMI) 34.0-34.9, adult: Secondary | ICD-10-CM | POA: Diagnosis not present

## 2018-05-27 DIAGNOSIS — I509 Heart failure, unspecified: Secondary | ICD-10-CM | POA: Diagnosis not present

## 2018-05-27 DIAGNOSIS — I10 Essential (primary) hypertension: Secondary | ICD-10-CM | POA: Diagnosis not present

## 2018-05-27 DIAGNOSIS — I2699 Other pulmonary embolism without acute cor pulmonale: Secondary | ICD-10-CM | POA: Diagnosis not present

## 2018-06-11 ENCOUNTER — Ambulatory Visit (INDEPENDENT_AMBULATORY_CARE_PROVIDER_SITE_OTHER): Payer: Medicare Other | Admitting: Cardiovascular Disease

## 2018-06-11 ENCOUNTER — Encounter: Payer: Self-pay | Admitting: Cardiovascular Disease

## 2018-06-11 VITALS — BP 100/50 | HR 74 | Ht 62.0 in | Wt 190.8 lb

## 2018-06-11 DIAGNOSIS — I10 Essential (primary) hypertension: Secondary | ICD-10-CM

## 2018-06-11 DIAGNOSIS — D6859 Other primary thrombophilia: Secondary | ICD-10-CM | POA: Diagnosis not present

## 2018-06-11 DIAGNOSIS — I429 Cardiomyopathy, unspecified: Secondary | ICD-10-CM

## 2018-06-11 DIAGNOSIS — I482 Chronic atrial fibrillation, unspecified: Secondary | ICD-10-CM

## 2018-06-11 DIAGNOSIS — Z86711 Personal history of pulmonary embolism: Secondary | ICD-10-CM | POA: Diagnosis not present

## 2018-06-11 DIAGNOSIS — I5022 Chronic systolic (congestive) heart failure: Secondary | ICD-10-CM | POA: Diagnosis not present

## 2018-06-11 NOTE — Progress Notes (Signed)
SUBJECTIVE: The patient presents for routine follow-up of chronic systolic heart failure, chronic atrial fibrillation, and mitral regurgitation.  Echocardiogram performed on 05/14/16 reportedly demonstrated severely reduced left ventricular systolic function, LVEF 30-35%, mild LVH, mild mitral and tricuspid regurgitation, moderate left atrial and mild right atrial enlargement, and mildly elevated pulmonary pressures.  Nuclear stress test 06/11/16 did not show any large ischemic territories. There was a small, mild intensity, apical anterior defect that was partially reversible and thought related to attenuation artifact versus a minor region of ischemia.  She has a history of protein S deficiency and pulmonary embolism. She has venous varicosities.  She denies chest pain and paroxysmal nocturnal dyspnea.  She does not use salt and avoids caffeine.  She weighs herself almost daily.  Chronic exertional dyspnea is stable.  She seldom has lightheadedness and dizziness but does complain of some generalized fatigue.  She denies syncope.  She does housework and reads books.  Her sister lives 1 mile away.  ECG performed in the office today which I ordered and personally interpreted demonstrates rate controlled atrial fibrillation.   Review of Systems: As per "subjective", otherwise negative.  Allergies  Allergen Reactions  . Benadryl [Diphenhydramine Hcl (Sleep)] Other (See Comments)    "Makes me wild acting"  . Penicillins     rash  . Streptomycin Sulfate [Streptomycin]     rash    Current Outpatient Medications  Medication Sig Dispense Refill  . calcium carbonate (OS-CAL) 600 MG TABS tablet Take 1 tablet by mouth 2 (two) times daily.    . carvedilol (COREG) 12.5 MG tablet Take 1 tablet by mouth 2 (two) times daily.    . cetirizine (ZYRTEC) 10 MG tablet Take 1 tablet by mouth daily.    Marland Kitchen docusate sodium (COLACE) 100 MG capsule Take 1 capsule by mouth 3 (three) times daily as needed.     Marland Kitchen ENTRESTO 24-26 MG TAKE ONE TABLET BY MOUTH TWICE DAILY 60 tablet 6  . furosemide (LASIX) 40 MG tablet Take 40 mg by mouth daily.     Marland Kitchen glimepiride (AMARYL) 2 MG tablet Take 1 tablet by mouth daily with breakfast.     . metFORMIN (GLUCOPHAGE) 500 MG tablet Take 1,000 mg by mouth 2 (two) times daily.     . Multiple Vitamins-Minerals (MULTIVITAMIN & MINERAL PO) Take 1 tablet by mouth daily.    . potassium chloride (K-DUR,KLOR-CON) 10 MEQ tablet Take 10 mEq by mouth daily.    . rosuvastatin (CRESTOR) 5 MG tablet Take 5 mg by mouth daily at 6 PM.    . spironolactone (ALDACTONE) 25 MG tablet Take 0.5 tablets by mouth daily.    Marland Kitchen warfarin (COUMADIN) 2.5 MG tablet Take by mouth.     No current facility-administered medications for this visit.     Past Medical History:  Diagnosis Date  . CHF (congestive heart failure) (HCC)   . Diabetes mellitus without complication (HCC)   . Dyslipidemia     Past Surgical History:  Procedure Laterality Date  . BREAST BIOPSY    . CATARACT EXTRACTION      Social History   Socioeconomic History  . Marital status: Widowed    Spouse name: Not on file  . Number of children: Not on file  . Years of education: Not on file  . Highest education level: Not on file  Occupational History  . Not on file  Social Needs  . Financial resource strain: Not on file  . Food insecurity:  Worry: Not on file    Inability: Not on file  . Transportation needs:    Medical: Not on file    Non-medical: Not on file  Tobacco Use  . Smoking status: Never Smoker  . Smokeless tobacco: Never Used  Substance and Sexual Activity  . Alcohol use: No    Alcohol/week: 0.0 oz  . Drug use: No  . Sexual activity: Not on file  Lifestyle  . Physical activity:    Days per week: Not on file    Minutes per session: Not on file  . Stress: Not on file  Relationships  . Social connections:    Talks on phone: Not on file    Gets together: Not on file    Attends religious  service: Not on file    Active member of club or organization: Not on file    Attends meetings of clubs or organizations: Not on file    Relationship status: Not on file  . Intimate partner violence:    Fear of current or ex partner: Not on file    Emotionally abused: Not on file    Physically abused: Not on file    Forced sexual activity: Not on file  Other Topics Concern  . Not on file  Social History Narrative  . Not on file     Vitals:   06/11/18 1516 06/11/18 1525 06/11/18 1529  BP: (!) 86/56 (!) 116/58 (!) 100/50  Pulse: 73 74   SpO2: 96% 96%   Weight: 190 lb 12.8 oz (86.5 kg)    Height: 5\' 2"  (1.575 m)      Wt Readings from Last 3 Encounters:  06/11/18 190 lb 12.8 oz (86.5 kg)  12/03/17 191 lb (86.6 kg)  09/22/17 193 lb (87.5 kg)     PHYSICAL EXAM General: NAD HEENT: Normal. Neck: No JVD, no thyromegaly. Lungs: Clear to auscultation bilaterally with normal respiratory effort. CV: Regular rate and irregular rhythm, normal S1/S2, no S3, no murmur.  Trace bilateral lower extremity edema.  Abdomen: Soft, nontender, no distention.  Neurologic: Alert and oriented.  Psych: Normal affect. Skin: Normal. Musculoskeletal: No gross deformities.    ECG: Reviewed above under Subjective   Labs: Lab Results  Component Value Date/Time   K 4.4 04/20/2017 03:05 PM   BUN 30 (H) 04/20/2017 03:05 PM   CREATININE 1.27 (H) 04/20/2017 03:05 PM   ALT 13 04/20/2017 03:05 PM   HGB 11.5 (L) 04/20/2017 03:05 PM     Lipids: No results found for: LDLCALC, LDLDIRECT, CHOL, TRIG, HDL     ASSESSMENT AND PLAN:  1. Chronic systolic heart failure with bilateral lower extremity edema:  Symptomatically stable.  This is presumably nonischemic in etiology.  NYHA class II symptoms.  Weight appears to be stable.  Currently on carvedilol 12.5 mg twice daily, Entresto 24/26 mg twice daily, and spironolactone 12.5 mg daily.  Continue 40 mg daily.  Blood pressure has been low normal.  I will  discontinue spironolactone.  2. Chronic atrial fibrillation: Anticoagulated with warfarin. HR controlled with Coreg.  3. Essential HTN:  Pressure has been low normal.  I will discontinue spironolactone.  4. Pulmonary embolism/protein S deficiency: Continue warfarin.     Disposition: Follow up 6 months   06/20/2017, M.D., F.A.C.C.

## 2018-06-11 NOTE — Patient Instructions (Signed)
Your physician wants you to follow-up in: 6 MONTHS WITH DR Reggy Eye will receive a reminder letter in the mail two months in advance. If you don't receive a letter, please call our office to schedule the follow-up appointment.  Your physician has recommended you make the following change in your medication:   STOP SPIRONOLACTONE  Thank you for choosing [facility]!!

## 2018-06-24 DIAGNOSIS — Z6836 Body mass index (BMI) 36.0-36.9, adult: Secondary | ICD-10-CM | POA: Diagnosis not present

## 2018-06-24 DIAGNOSIS — I10 Essential (primary) hypertension: Secondary | ICD-10-CM | POA: Diagnosis not present

## 2018-06-24 DIAGNOSIS — I509 Heart failure, unspecified: Secondary | ICD-10-CM | POA: Diagnosis not present

## 2018-06-24 DIAGNOSIS — Z299 Encounter for prophylactic measures, unspecified: Secondary | ICD-10-CM | POA: Diagnosis not present

## 2018-06-24 DIAGNOSIS — I2699 Other pulmonary embolism without acute cor pulmonale: Secondary | ICD-10-CM | POA: Diagnosis not present

## 2018-06-25 ENCOUNTER — Other Ambulatory Visit: Payer: Self-pay | Admitting: Cardiovascular Disease

## 2018-07-20 DIAGNOSIS — M1710 Unilateral primary osteoarthritis, unspecified knee: Secondary | ICD-10-CM | POA: Diagnosis not present

## 2018-07-20 DIAGNOSIS — I2699 Other pulmonary embolism without acute cor pulmonale: Secondary | ICD-10-CM | POA: Diagnosis not present

## 2018-07-20 DIAGNOSIS — Z299 Encounter for prophylactic measures, unspecified: Secondary | ICD-10-CM | POA: Diagnosis not present

## 2018-07-20 DIAGNOSIS — Z6836 Body mass index (BMI) 36.0-36.9, adult: Secondary | ICD-10-CM | POA: Diagnosis not present

## 2018-07-20 DIAGNOSIS — E1165 Type 2 diabetes mellitus with hyperglycemia: Secondary | ICD-10-CM | POA: Diagnosis not present

## 2018-07-20 DIAGNOSIS — I509 Heart failure, unspecified: Secondary | ICD-10-CM | POA: Diagnosis not present

## 2018-07-27 DIAGNOSIS — Z1231 Encounter for screening mammogram for malignant neoplasm of breast: Secondary | ICD-10-CM | POA: Diagnosis not present

## 2018-08-17 DIAGNOSIS — I2699 Other pulmonary embolism without acute cor pulmonale: Secondary | ICD-10-CM | POA: Diagnosis not present

## 2018-08-17 DIAGNOSIS — Z6835 Body mass index (BMI) 35.0-35.9, adult: Secondary | ICD-10-CM | POA: Diagnosis not present

## 2018-08-17 DIAGNOSIS — I10 Essential (primary) hypertension: Secondary | ICD-10-CM | POA: Diagnosis not present

## 2018-08-17 DIAGNOSIS — Z23 Encounter for immunization: Secondary | ICD-10-CM | POA: Diagnosis not present

## 2018-08-17 DIAGNOSIS — Z299 Encounter for prophylactic measures, unspecified: Secondary | ICD-10-CM | POA: Diagnosis not present

## 2018-08-24 DIAGNOSIS — I509 Heart failure, unspecified: Secondary | ICD-10-CM | POA: Diagnosis not present

## 2018-08-24 DIAGNOSIS — E119 Type 2 diabetes mellitus without complications: Secondary | ICD-10-CM | POA: Diagnosis not present

## 2018-08-24 DIAGNOSIS — E7800 Pure hypercholesterolemia, unspecified: Secondary | ICD-10-CM | POA: Diagnosis not present

## 2018-08-24 DIAGNOSIS — I10 Essential (primary) hypertension: Secondary | ICD-10-CM | POA: Diagnosis not present

## 2018-09-08 NOTE — Progress Notes (Signed)
Office Visit Note  Patient: Rebecca Hanson             Date of Birth: [birth year]           MRN: [id number]             PCP: Ignatius Specking, MD Referring: Ignatius Specking, MD Visit Date: 09/22/2018 Occupation: @GUAROCC @  Subjective:  Pain in both knees   History of Present Illness: Airiana Elman is a 84 y.o. female with history of polymyalgia rheumatica, osteoarthritis and pseudogout.  She states she has been having increased pain and discomfort during all activities.  She states she has difficulty raising her arm and getting up from the chair.  She has difficulty walking due to pain and discomfort in her bilateral knee joints.  She notices some swelling in her right knee joint in her bilateral ankles.  Activities of Daily Living:  Patient reports morning stiffness for 24 hours.   Patient Reports nocturnal pain.  Difficulty dressing/grooming: Denies Difficulty climbing stairs: Reports Difficulty getting out of chair: Denies Difficulty using hands for taps, buttons, cutlery, and/or writing: Denies  Review of Systems  Constitutional: Positive for fatigue.  HENT: Negative for mouth sores, trouble swallowing, trouble swallowing and mouth dryness.   Eyes: Negative for pain, redness, itching and dryness.  Respiratory: Negative for shortness of breath, wheezing and difficulty breathing.   Cardiovascular: Negative for chest pain, palpitations, hypertension and swelling in legs/feet.  Gastrointestinal: Negative for abdominal pain, constipation, diarrhea, nausea and vomiting.  Endocrine: Negative for increased urination.  Genitourinary: Negative for painful urination, nocturia and pelvic pain.  Musculoskeletal: Positive for arthralgias, joint pain, joint swelling and morning stiffness.  Skin: Negative for rash and hair loss.  Allergic/Immunologic: Negative for susceptible to infections.  Neurological: Positive for weakness. Negative for dizziness, light-headedness, headaches and memory  loss.  Hematological: Positive for bruising/bleeding tendency.  Psychiatric/Behavioral: Negative for confusion. The patient is not nervous/anxious.     PMFS History:  Patient Active Problem List   Diagnosis Date Noted  . Polymyalgia rheumatica (HCC) 11/19/2016  . Primary osteoarthritis of both knees 11/19/2016  . Primary osteoarthritis of both hands 11/19/2016  . Pseudogout 11/19/2016  . Essential hypertension 11/19/2016  . Diabetes mellitus 11/19/2016  . History of DVT (deep vein thrombosis) 11/19/2016  . History of pulmonary embolism 11/19/2016  . History of congestive heart failure 11/19/2016    Past Medical History:  Diagnosis Date  . CHF (congestive heart failure) (HCC)   . Diabetes mellitus without complication (HCC)   . Dyslipidemia     Family History  Problem Relation Age of Onset  . Heart disease Father   . CAD Father   . CVA Father    Past Surgical History:  Procedure Laterality Date  . BREAST BIOPSY    . CATARACT EXTRACTION     Social History   Social History Narrative  . Not on file    Objective: Vital Signs: BP (!) 143/75 (BP Location: Left Arm, Patient Position: Sitting, Cuff Size: Normal)   Pulse 69   Resp 14   Ht 5' (1.524 m)   Wt 186 lb (84.4 kg)   BMI 36.33 kg/m    Physical Exam  Constitutional: She is oriented to person, place, and time. She appears well-developed and well-nourished.  HENT:  Head: Normocephalic and atraumatic.  Eyes: Conjunctivae and EOM are normal.  Neck: Normal range of motion.  Cardiovascular: Normal rate, regular rhythm, normal heart sounds and intact distal pulses.  Pulmonary/Chest: Effort  normal and breath sounds normal.  Abdominal: Soft. Bowel sounds are normal.  Lymphadenopathy:    She has no cervical adenopathy.  Neurological: She is alert and oriented to person, place, and time.  Skin: Skin is warm and dry. Capillary refill takes less than 2 seconds.  Psychiatric: She has a normal mood and affect. Her  behavior is normal.  Nursing note and vitals reviewed.    Musculoskeletal Exam: C-spine thoracic and lumbar spine limited range of motion.  Shoulder joint abduction was limited to 90 degrees.  Elbow joints wrist joints were in good range of motion.  She has DIP and PIP thickening due to underlying osteoarthritis.  No synovitis was noted.  She has good range of motion of her hip joints and knee joints.  No warmth swelling or effusion was noted in the knee joints.  She has some pedal edema.  CDAI Exam: CDAI Score: Not documented Patient Global Assessment: Not documented; Provider Global Assessment: Not documented Swollen: Not documented; Tender: Not documented Joint Exam   Not documented   There is currently no information documented on the homunculus. Go to the Rheumatology activity and complete the homunculus joint exam.  Investigation: No additional findings.  Imaging: Xr Knee 3 View Left  Result Date: 09/22/2018 Severe medial compartment narrowing was noted.  Severe patellofemoral narrowing was noted. Impression: These findings are consistent with severe osteoarthritis and severe malacia patella.  Xr Knee 3 View Right  Result Date: 09/22/2018 Severe lateral compartment narrowing with lateral osteophytes.  Severe patellofemoral narrowing was noted. Impression: These findings are consistent with severe osteoarthritis and severe chondromalacia patella.   Recent Labs: Lab Results  Component Value Date   WBC 5.0 04/20/2017   HGB 11.5 (L) 04/20/2017   PLT 140 04/20/2017   NA 139 04/20/2017   K 4.4 04/20/2017   CL 104 04/20/2017   CO2 22 04/20/2017   GLUCOSE 99 04/20/2017   BUN 30 (H) 04/20/2017   CREATININE 1.27 (H) 04/20/2017   BILITOT 0.5 04/20/2017   ALKPHOS 57 04/20/2017   AST 20 04/20/2017   ALT 13 04/20/2017   PROT 6.6 04/20/2017   ALBUMIN 3.9 04/20/2017   CALCIUM 9.7 04/20/2017   GFRAA 45 (L) 04/20/2017    Speciality Comments: No specialty comments  available.  Procedures:  Large Joint Inj: R knee on 09/22/2018 12:10 PM Indications: pain Details: 27 G 1.5 in needle, medial approach  Arthrogram: No  Medications: 1.5 mL lidocaine 1 %; 40 mg triamcinolone acetonide 40 MG/ML Aspirate: 0 mL Outcome: tolerated well, no immediate complications Procedure, treatment alternatives, risks and benefits explained, specific risks discussed. Consent was given by the patient. Immediately prior to procedure a time out was called to verify the correct patient, procedure, equipment, support staff and site/side marked as required. Patient was prepped and draped in the usual sterile fashion.     Allergies: Benadryl [diphenhydramine hcl (sleep)]; Penicillins; and Streptomycin sulfate [streptomycin]   Assessment / Plan:     Visit Diagnoses: Polymyalgia rheumatica (HCC) -she complains of increased fatigue and increased generalized pain.  I will obtain following labs today.  Plan: CK, Sedimentation rate.  We will call her back with the lab results.  Primary osteoarthritis of both hands-she has stiffness in her bilateral hands due to underlying osteoarthritis.  Chronic pain of both knees -she has no history of osteoarthritis but she is having increased pain.  She states she failed Visco supplement injections in the past.  She has been using V/Q orthocare brace for her  right knee joint.  Per her request right knee joint was injected with cortisone as described above.  She tolerated the procedure well.  I have advised her to monitor her blood sugar and blood pressure closely.  Plan: XR KNEE 3 VIEW RIGHT, XR KNEE 3 VIEW LEFT  Primary osteoarthritis of both knees-patient has severe osteoarthritis of bilateral knee joints.  She has severe lateral compartment narrowing of the right knee and severe medial compartment narrowing of the left knee.  She also has bilateral severe chondromalacia patella.  The arthritis in the right knee is worse than the left knee.  I have  advised her to schedule an appointment with orthopedic surgeon to discuss total knee replacement.  Pseudogout-patient denies any flares.  History of congestive heart failure - 2016.  History of DVT (deep vein thrombosis)  History of diabetes mellitus  History of hypertension  History of pulmonary embolism - 2002.   Orders: Orders Placed This Encounter  Procedures  . Large Joint Inj  . XR KNEE 3 VIEW RIGHT  . XR KNEE 3 VIEW LEFT  . CK  . Sedimentation rate   No orders of the defined types were placed in this encounter.   Face-to-face time spent with patient was 30 minutes. Greater than 50% of time was spent in counseling and coordination of care.  Follow-Up Instructions: Return in about 1 year (around 09/23/2019) for Osteoarthritis, PMR.   Pollyann Savoy, MD  Note - This record has been created using Animal nutritionist.  Chart creation errors have been sought, but may not always  have been located. Such creation errors do not reflect on  the standard of medical care.

## 2018-09-14 DIAGNOSIS — Z299 Encounter for prophylactic measures, unspecified: Secondary | ICD-10-CM | POA: Diagnosis not present

## 2018-09-14 DIAGNOSIS — I429 Cardiomyopathy, unspecified: Secondary | ICD-10-CM | POA: Diagnosis not present

## 2018-09-14 DIAGNOSIS — Z6835 Body mass index (BMI) 35.0-35.9, adult: Secondary | ICD-10-CM | POA: Diagnosis not present

## 2018-09-14 DIAGNOSIS — I10 Essential (primary) hypertension: Secondary | ICD-10-CM | POA: Diagnosis not present

## 2018-09-14 DIAGNOSIS — I2699 Other pulmonary embolism without acute cor pulmonale: Secondary | ICD-10-CM | POA: Diagnosis not present

## 2018-09-22 ENCOUNTER — Encounter: Payer: Self-pay | Admitting: Rheumatology

## 2018-09-22 ENCOUNTER — Ambulatory Visit (INDEPENDENT_AMBULATORY_CARE_PROVIDER_SITE_OTHER): Payer: Self-pay

## 2018-09-22 ENCOUNTER — Ambulatory Visit (INDEPENDENT_AMBULATORY_CARE_PROVIDER_SITE_OTHER): Payer: Medicare Other | Admitting: Rheumatology

## 2018-09-22 ENCOUNTER — Ambulatory Visit (INDEPENDENT_AMBULATORY_CARE_PROVIDER_SITE_OTHER): Payer: Medicare Other

## 2018-09-22 VITALS — BP 143/75 | HR 69 | Resp 14 | Ht 60.0 in | Wt 186.0 lb

## 2018-09-22 DIAGNOSIS — M25562 Pain in left knee: Secondary | ICD-10-CM | POA: Diagnosis not present

## 2018-09-22 DIAGNOSIS — Z86711 Personal history of pulmonary embolism: Secondary | ICD-10-CM | POA: Diagnosis not present

## 2018-09-22 DIAGNOSIS — M353 Polymyalgia rheumatica: Secondary | ICD-10-CM | POA: Diagnosis not present

## 2018-09-22 DIAGNOSIS — G8929 Other chronic pain: Secondary | ICD-10-CM

## 2018-09-22 DIAGNOSIS — M25561 Pain in right knee: Secondary | ICD-10-CM

## 2018-09-22 DIAGNOSIS — M19042 Primary osteoarthritis, left hand: Secondary | ICD-10-CM

## 2018-09-22 DIAGNOSIS — Z86718 Personal history of other venous thrombosis and embolism: Secondary | ICD-10-CM

## 2018-09-22 DIAGNOSIS — M19041 Primary osteoarthritis, right hand: Secondary | ICD-10-CM

## 2018-09-22 DIAGNOSIS — M1120 Other chondrocalcinosis, unspecified site: Secondary | ICD-10-CM

## 2018-09-22 DIAGNOSIS — M170 Bilateral primary osteoarthritis of knee: Secondary | ICD-10-CM

## 2018-09-22 DIAGNOSIS — Z8679 Personal history of other diseases of the circulatory system: Secondary | ICD-10-CM | POA: Diagnosis not present

## 2018-09-22 DIAGNOSIS — Z8639 Personal history of other endocrine, nutritional and metabolic disease: Secondary | ICD-10-CM | POA: Diagnosis not present

## 2018-09-22 MED ORDER — TRIAMCINOLONE ACETONIDE 40 MG/ML IJ SUSP
40.0000 mg | INTRAMUSCULAR | Status: AC | PRN
  Administered 2018-09-22: 40 mg via INTRA_ARTICULAR

## 2018-09-22 MED ORDER — LIDOCAINE HCL 1 % IJ SOLN
1.5000 mL | INTRAMUSCULAR | Status: AC | PRN
  Administered 2018-09-22: 1.5 mL

## 2018-09-23 LAB — CK: CK TOTAL: 48 U/L (ref 29–143)

## 2018-09-23 LAB — SEDIMENTATION RATE: Sed Rate: 14 mm/h (ref 0–30)

## 2018-09-23 NOTE — Progress Notes (Signed)
Labs are normal.

## 2018-10-12 DIAGNOSIS — E7800 Pure hypercholesterolemia, unspecified: Secondary | ICD-10-CM | POA: Diagnosis not present

## 2018-10-12 DIAGNOSIS — I10 Essential (primary) hypertension: Secondary | ICD-10-CM | POA: Diagnosis not present

## 2018-10-12 DIAGNOSIS — I509 Heart failure, unspecified: Secondary | ICD-10-CM | POA: Diagnosis not present

## 2018-10-12 DIAGNOSIS — E119 Type 2 diabetes mellitus without complications: Secondary | ICD-10-CM | POA: Diagnosis not present

## 2018-10-13 DIAGNOSIS — M170 Bilateral primary osteoarthritis of knee: Secondary | ICD-10-CM | POA: Diagnosis not present

## 2018-10-26 DIAGNOSIS — Z299 Encounter for prophylactic measures, unspecified: Secondary | ICD-10-CM | POA: Diagnosis not present

## 2018-10-26 DIAGNOSIS — Z6835 Body mass index (BMI) 35.0-35.9, adult: Secondary | ICD-10-CM | POA: Diagnosis not present

## 2018-10-26 DIAGNOSIS — I2699 Other pulmonary embolism without acute cor pulmonale: Secondary | ICD-10-CM | POA: Diagnosis not present

## 2018-10-26 DIAGNOSIS — E1165 Type 2 diabetes mellitus with hyperglycemia: Secondary | ICD-10-CM | POA: Diagnosis not present

## 2018-10-26 DIAGNOSIS — I509 Heart failure, unspecified: Secondary | ICD-10-CM | POA: Diagnosis not present

## 2018-10-26 DIAGNOSIS — I429 Cardiomyopathy, unspecified: Secondary | ICD-10-CM | POA: Diagnosis not present

## 2018-11-24 DIAGNOSIS — Z1211 Encounter for screening for malignant neoplasm of colon: Secondary | ICD-10-CM | POA: Diagnosis not present

## 2018-11-24 DIAGNOSIS — R5383 Other fatigue: Secondary | ICD-10-CM | POA: Diagnosis not present

## 2018-11-24 DIAGNOSIS — I509 Heart failure, unspecified: Secondary | ICD-10-CM | POA: Diagnosis not present

## 2018-11-24 DIAGNOSIS — Z1331 Encounter for screening for depression: Secondary | ICD-10-CM | POA: Diagnosis not present

## 2018-11-24 DIAGNOSIS — Z6834 Body mass index (BMI) 34.0-34.9, adult: Secondary | ICD-10-CM | POA: Diagnosis not present

## 2018-11-24 DIAGNOSIS — E7800 Pure hypercholesterolemia, unspecified: Secondary | ICD-10-CM | POA: Diagnosis not present

## 2018-11-24 DIAGNOSIS — I2699 Other pulmonary embolism without acute cor pulmonale: Secondary | ICD-10-CM | POA: Diagnosis not present

## 2018-11-24 DIAGNOSIS — I10 Essential (primary) hypertension: Secondary | ICD-10-CM | POA: Diagnosis not present

## 2018-11-24 DIAGNOSIS — Z1339 Encounter for screening examination for other mental health and behavioral disorders: Secondary | ICD-10-CM | POA: Diagnosis not present

## 2018-11-24 DIAGNOSIS — Z79899 Other long term (current) drug therapy: Secondary | ICD-10-CM | POA: Diagnosis not present

## 2018-11-24 DIAGNOSIS — Z299 Encounter for prophylactic measures, unspecified: Secondary | ICD-10-CM | POA: Diagnosis not present

## 2018-11-24 DIAGNOSIS — Z0000 Encounter for general adult medical examination without abnormal findings: Secondary | ICD-10-CM | POA: Diagnosis not present

## 2018-11-24 DIAGNOSIS — Z7189 Other specified counseling: Secondary | ICD-10-CM | POA: Diagnosis not present

## 2018-11-30 ENCOUNTER — Telehealth: Payer: Self-pay | Admitting: Cardiovascular Disease

## 2018-11-30 NOTE — Telephone Encounter (Signed)
Called patient and LVM to call and confirm appt for 12-01-2018. °

## 2018-12-01 ENCOUNTER — Encounter: Payer: Self-pay | Admitting: Cardiovascular Disease

## 2018-12-01 ENCOUNTER — Ambulatory Visit (INDEPENDENT_AMBULATORY_CARE_PROVIDER_SITE_OTHER): Payer: Medicare Other | Admitting: Cardiovascular Disease

## 2018-12-01 VITALS — BP 168/60 | HR 77 | Ht 60.0 in | Wt 184.0 lb

## 2018-12-01 DIAGNOSIS — I5022 Chronic systolic (congestive) heart failure: Secondary | ICD-10-CM

## 2018-12-01 DIAGNOSIS — I4821 Permanent atrial fibrillation: Secondary | ICD-10-CM | POA: Diagnosis not present

## 2018-12-01 DIAGNOSIS — D6859 Other primary thrombophilia: Secondary | ICD-10-CM | POA: Diagnosis not present

## 2018-12-01 DIAGNOSIS — Z86711 Personal history of pulmonary embolism: Secondary | ICD-10-CM

## 2018-12-01 DIAGNOSIS — I429 Cardiomyopathy, unspecified: Secondary | ICD-10-CM | POA: Diagnosis not present

## 2018-12-01 DIAGNOSIS — I10 Essential (primary) hypertension: Secondary | ICD-10-CM

## 2018-12-01 MED ORDER — SACUBITRIL-VALSARTAN 49-51 MG PO TABS: 1.0000 | ORAL_TABLET | Freq: Two times a day (BID) | ORAL | 6 refills | Status: DC

## 2018-12-01 NOTE — Progress Notes (Signed)
SUBJECTIVE: The patient presents for routine follow-up of chronic systolic heart failure, permanent atrial fibrillation, and mitral regurgitation.  Echocardiogram performed on 05/14/16 reportedly demonstrated severely reduced left ventricular systolic function, LVEF 30-35%, mild LVH, mild mitral and tricuspid regurgitation, moderate left atrial and mild right atrial enlargement, and mildly elevated pulmonary pressures.  Nuclear stress test 06/11/16 did not show any large ischemic territories. There was a small, mild intensity, apical anterior defect that was partially reversible and thought related to attenuation artifact versus a minor region of ischemia.  She has a history of protein S deficiency and pulmonary embolism. She has venous varicosities.  She denies chest pain, lightheadedness, dizziness, and palpitations.  She denies orthopnea and paroxysmal nocturnal dyspnea.  Chronic exertional dyspnea is stable.  She told me her blood pressure was elevated at her PCPs office last month.  It is elevated today.  She lives alone.  2 sisters live locally and another sister lives in [place], [place].  They all got together for Christmas.    Review of Systems: As per "subjective", otherwise negative.  Allergies  Allergen Reactions  . Benadryl [Diphenhydramine Hcl (Sleep)] Other (See Comments)    "Makes me wild acting"  . Penicillins     rash  . Streptomycin Sulfate [Streptomycin]     rash    Current Outpatient Medications  Medication Sig Dispense Refill  . calcium carbonate (OS-CAL) 600 MG TABS tablet Take 1 tablet by mouth 2 (two) times daily.    . carvedilol (COREG) 12.5 MG tablet Take 1 tablet by mouth 2 (two) times daily.    . cetirizine (ZYRTEC) 10 MG tablet Take 1 tablet by mouth daily.    . Diclofenac Sodium (DICLO GEL TD) Place onto the skin.    Marland Kitchen docusate sodium (COLACE) 100 MG capsule Take 1 capsule by mouth 3 (three) times daily as needed.    Marland Kitchen ENTRESTO  24-26 MG TAKE ONE TABLET BY MOUTH TWICE DAILY 60 tablet 6  . furosemide (LASIX) 40 MG tablet Take 40 mg by mouth daily.     Marland Kitchen glimepiride (AMARYL) 2 MG tablet Take 1 tablet by mouth daily with breakfast.     . metFORMIN (GLUCOPHAGE) 500 MG tablet Take 1,000 mg by mouth 2 (two) times daily.     . Multiple Vitamins-Minerals (MULTIVITAMIN & MINERAL PO) Take 1 tablet by mouth daily.    . potassium chloride (K-DUR,KLOR-CON) 10 MEQ tablet Take 10 mEq by mouth daily.    . rosuvastatin (CRESTOR) 5 MG tablet Take 5 mg by mouth daily at 6 PM.    . warfarin (COUMADIN) 2.5 MG tablet Take by mouth.     No current facility-administered medications for this visit.     Past Medical History:  Diagnosis Date  . CHF (congestive heart failure) (HCC)   . Diabetes mellitus without complication (HCC)   . Dyslipidemia     Past Surgical History:  Procedure Laterality Date  . BREAST BIOPSY    . CATARACT EXTRACTION      Social History   Socioeconomic History  . Marital status: Widowed    Spouse name: Not on file  . Number of children: Not on file  . Years of education: Not on file  . Highest education level: Not on file  Occupational History  . Not on file  Social Needs  . Financial resource strain: Not on file  . Food insecurity:    Worry: Not on file    Inability: Not on file  .  Transportation needs:    Medical: Not on file    Non-medical: Not on file  Tobacco Use  . Smoking status: Never Smoker  . Smokeless tobacco: Never Used  Substance and Sexual Activity  . Alcohol use: No    Alcohol/week: 0.0 standard drinks  . Drug use: No  . Sexual activity: Not on file  Lifestyle  . Physical activity:    Days per week: Not on file    Minutes per session: Not on file  . Stress: Not on file  Relationships  . Social connections:    Talks on phone: Not on file    Gets together: Not on file    Attends religious service: Not on file    Active member of club or organization: Not on file     Attends meetings of clubs or organizations: Not on file    Relationship status: Not on file  . Intimate partner violence:    Fear of current or ex partner: Not on file    Emotionally abused: Not on file    Physically abused: Not on file    Forced sexual activity: Not on file  Other Topics Concern  . Not on file  Social History Narrative  . Not on file     Vitals:   12/01/18 1309  BP: (!) 168/60  Pulse: 77  SpO2: 98%  Weight: 184 lb (83.5 kg)  Height: 5' (1.524 m)    Wt Readings from Last 3 Encounters:  12/01/18 184 lb (83.5 kg)  09/22/18 186 lb (84.4 kg)  06/11/18 190 lb 12.8 oz (86.5 kg)     PHYSICAL EXAM General: NAD HEENT: Normal. Neck: No JVD, no thyromegaly. Lungs: Clear to auscultation bilaterally with normal respiratory effort. CV: Regular rate and irregular rhythm, normal S1/S2, no S3, no murmur.  Trace bilateral lower extremity edema.  No carotid bruit.   Abdomen: Soft, nontender, no distention.  Neurologic: Alert and oriented.  Psych: Normal affect. Skin: Normal. Musculoskeletal: No gross deformities.    ECG: Reviewed above under Subjective   Labs: Lab Results  Component Value Date/Time   K 4.4 04/20/2017 03:05 PM   BUN 30 (H) 04/20/2017 03:05 PM   CREATININE 1.27 (H) 04/20/2017 03:05 PM   ALT 13 04/20/2017 03:05 PM   HGB 11.5 (L) 04/20/2017 03:05 PM     Lipids: No results found for: LDLCALC, LDLDIRECT, CHOL, TRIG, HDL     ASSESSMENT AND PLAN:  1. Chronic systolic heart failure: She has chronic trace bilateral lower extremity edema. Symptomatically stable.  This is presumably nonischemic in etiology.NYHA class II symptoms.Weight appears to be stable. Currently on carvedilol 12.5 mg twice daily and Entresto24/26 mg twice daily.Continue  Lasix 40 mg daily.    I previously discontinued spironolactone due to low normal blood pressure.  Blood pressure is elevated today.  I will increase Entresto to 49-51 mg twice daily.  2.  Permanent  atrial fibrillation: Anticoagulated with warfarin.  INR monitored by PCP.  HR controlled with Coreg.  3. Essential HTN:  Blood pressure is elevated today.  I will increase Entresto to 49-51 mg twice daily.  4. Pulmonary embolism/protein S deficiency: Continue warfarin.  INR monitored by PCP.   Disposition: Follow up 6 months   Prentice Docker, M.D., F.A.C.C.

## 2018-12-01 NOTE — Patient Instructions (Addendum)
Medication Instructions:   Your physician has recommended you make the following change in your medication:   Increase entresto to 49/51 mg by mouth twice daily  Continue all other medications the same  Labwork:  NONE  Testing/Procedures:  NONE  Follow-Up:  Your physician recommends that you schedule a follow-up appointment in: 6 months. You will receive a reminder letter in the mail in about 4 months reminding you to call and schedule your appointment. If you don't receive this letter, please contact our office.  Any Other Special Instructions Will Be Listed Below (If Applicable).  If you need a refill on your cardiac medications before your next appointment, please call your pharmacy.

## 2018-12-14 DIAGNOSIS — I10 Essential (primary) hypertension: Secondary | ICD-10-CM | POA: Diagnosis not present

## 2018-12-14 DIAGNOSIS — E7800 Pure hypercholesterolemia, unspecified: Secondary | ICD-10-CM | POA: Diagnosis not present

## 2018-12-14 DIAGNOSIS — I509 Heart failure, unspecified: Secondary | ICD-10-CM | POA: Diagnosis not present

## 2018-12-14 DIAGNOSIS — E119 Type 2 diabetes mellitus without complications: Secondary | ICD-10-CM | POA: Diagnosis not present

## 2018-12-28 DIAGNOSIS — Z961 Presence of intraocular lens: Secondary | ICD-10-CM | POA: Diagnosis not present

## 2018-12-28 DIAGNOSIS — H26492 Other secondary cataract, left eye: Secondary | ICD-10-CM | POA: Diagnosis not present

## 2018-12-28 DIAGNOSIS — H40013 Open angle with borderline findings, low risk, bilateral: Secondary | ICD-10-CM | POA: Diagnosis not present

## 2018-12-28 DIAGNOSIS — E119 Type 2 diabetes mellitus without complications: Secondary | ICD-10-CM | POA: Diagnosis not present

## 2019-01-19 DIAGNOSIS — M170 Bilateral primary osteoarthritis of knee: Secondary | ICD-10-CM | POA: Diagnosis not present

## 2019-02-01 DIAGNOSIS — I10 Essential (primary) hypertension: Secondary | ICD-10-CM | POA: Diagnosis not present

## 2019-02-01 DIAGNOSIS — Z6831 Body mass index (BMI) 31.0-31.9, adult: Secondary | ICD-10-CM | POA: Diagnosis not present

## 2019-02-01 DIAGNOSIS — I429 Cardiomyopathy, unspecified: Secondary | ICD-10-CM | POA: Diagnosis not present

## 2019-02-01 DIAGNOSIS — I2699 Other pulmonary embolism without acute cor pulmonale: Secondary | ICD-10-CM | POA: Diagnosis not present

## 2019-02-01 DIAGNOSIS — I509 Heart failure, unspecified: Secondary | ICD-10-CM | POA: Diagnosis not present

## 2019-02-01 DIAGNOSIS — E1165 Type 2 diabetes mellitus with hyperglycemia: Secondary | ICD-10-CM | POA: Diagnosis not present

## 2019-02-01 DIAGNOSIS — Z299 Encounter for prophylactic measures, unspecified: Secondary | ICD-10-CM | POA: Diagnosis not present

## 2019-02-02 ENCOUNTER — Telehealth: Payer: Self-pay | Admitting: Cardiovascular Disease

## 2019-02-02 NOTE — Telephone Encounter (Signed)
Returned call to patient.  Last seen 12/01/2018 by Dr. Purvis Sheffield.  He increased Entresto to 49/51mg  twice a day.  BP now running 105/65 & 96/63.  HR been 69-70's.  Patient does c/o weakness & dizziness for about the last 2 weeks.

## 2019-02-02 NOTE — Telephone Encounter (Signed)
Patient called stating that her BP is going down . She saw her PCP yesterday and she was advised to contact Dr. Purvis Sheffield.

## 2019-02-03 MED ORDER — SACUBITRIL-VALSARTAN 24-26 MG PO TABS: 1.0000 | ORAL_TABLET | Freq: Two times a day (BID) | ORAL | 6 refills | Status: DC

## 2019-02-03 NOTE — Telephone Encounter (Signed)
Patient notified and verbalized understanding.  Will send new prescription to [facility] (will ask them to deliver for her).

## 2019-02-03 NOTE — Telephone Encounter (Signed)
Reduce Entresto back to 24-26 mg bid. Her BP had been elevated at last visit with me and other providers, so would keep an eye on this.

## 2019-03-01 DIAGNOSIS — Z299 Encounter for prophylactic measures, unspecified: Secondary | ICD-10-CM | POA: Diagnosis not present

## 2019-03-01 DIAGNOSIS — I10 Essential (primary) hypertension: Secondary | ICD-10-CM | POA: Diagnosis not present

## 2019-03-01 DIAGNOSIS — I509 Heart failure, unspecified: Secondary | ICD-10-CM | POA: Diagnosis not present

## 2019-03-01 DIAGNOSIS — Z713 Dietary counseling and surveillance: Secondary | ICD-10-CM | POA: Diagnosis not present

## 2019-03-01 DIAGNOSIS — I2699 Other pulmonary embolism without acute cor pulmonale: Secondary | ICD-10-CM | POA: Diagnosis not present

## 2019-03-07 DIAGNOSIS — I10 Essential (primary) hypertension: Secondary | ICD-10-CM | POA: Diagnosis not present

## 2019-03-07 DIAGNOSIS — E119 Type 2 diabetes mellitus without complications: Secondary | ICD-10-CM | POA: Diagnosis not present

## 2019-03-07 DIAGNOSIS — E7800 Pure hypercholesterolemia, unspecified: Secondary | ICD-10-CM | POA: Diagnosis not present

## 2019-03-07 DIAGNOSIS — I509 Heart failure, unspecified: Secondary | ICD-10-CM | POA: Diagnosis not present

## 2019-03-29 DIAGNOSIS — I509 Heart failure, unspecified: Secondary | ICD-10-CM | POA: Diagnosis not present

## 2019-03-29 DIAGNOSIS — Z6831 Body mass index (BMI) 31.0-31.9, adult: Secondary | ICD-10-CM | POA: Diagnosis not present

## 2019-03-29 DIAGNOSIS — Z299 Encounter for prophylactic measures, unspecified: Secondary | ICD-10-CM | POA: Diagnosis not present

## 2019-03-29 DIAGNOSIS — I429 Cardiomyopathy, unspecified: Secondary | ICD-10-CM | POA: Diagnosis not present

## 2019-03-29 DIAGNOSIS — E1165 Type 2 diabetes mellitus with hyperglycemia: Secondary | ICD-10-CM | POA: Diagnosis not present

## 2019-03-29 DIAGNOSIS — I2699 Other pulmonary embolism without acute cor pulmonale: Secondary | ICD-10-CM | POA: Diagnosis not present

## 2019-04-05 DIAGNOSIS — I10 Essential (primary) hypertension: Secondary | ICD-10-CM | POA: Diagnosis not present

## 2019-04-05 DIAGNOSIS — E7800 Pure hypercholesterolemia, unspecified: Secondary | ICD-10-CM | POA: Diagnosis not present

## 2019-04-05 DIAGNOSIS — I509 Heart failure, unspecified: Secondary | ICD-10-CM | POA: Diagnosis not present

## 2019-04-05 DIAGNOSIS — E119 Type 2 diabetes mellitus without complications: Secondary | ICD-10-CM | POA: Diagnosis not present

## 2019-04-20 DIAGNOSIS — M170 Bilateral primary osteoarthritis of knee: Secondary | ICD-10-CM | POA: Diagnosis not present

## 2019-05-04 DIAGNOSIS — E7800 Pure hypercholesterolemia, unspecified: Secondary | ICD-10-CM | POA: Diagnosis not present

## 2019-05-04 DIAGNOSIS — E119 Type 2 diabetes mellitus without complications: Secondary | ICD-10-CM | POA: Diagnosis not present

## 2019-05-04 DIAGNOSIS — I10 Essential (primary) hypertension: Secondary | ICD-10-CM | POA: Diagnosis not present

## 2019-05-04 DIAGNOSIS — I509 Heart failure, unspecified: Secondary | ICD-10-CM | POA: Diagnosis not present

## 2019-05-10 DIAGNOSIS — I509 Heart failure, unspecified: Secondary | ICD-10-CM | POA: Diagnosis not present

## 2019-05-10 DIAGNOSIS — E1165 Type 2 diabetes mellitus with hyperglycemia: Secondary | ICD-10-CM | POA: Diagnosis not present

## 2019-05-10 DIAGNOSIS — I2699 Other pulmonary embolism without acute cor pulmonale: Secondary | ICD-10-CM | POA: Diagnosis not present

## 2019-05-10 DIAGNOSIS — I10 Essential (primary) hypertension: Secondary | ICD-10-CM | POA: Diagnosis not present

## 2019-05-10 DIAGNOSIS — Z6832 Body mass index (BMI) 32.0-32.9, adult: Secondary | ICD-10-CM | POA: Diagnosis not present

## 2019-05-10 DIAGNOSIS — Z299 Encounter for prophylactic measures, unspecified: Secondary | ICD-10-CM | POA: Diagnosis not present

## 2019-05-10 DIAGNOSIS — I429 Cardiomyopathy, unspecified: Secondary | ICD-10-CM | POA: Diagnosis not present

## 2019-06-06 ENCOUNTER — Ambulatory Visit: Payer: Medicare Other | Admitting: Cardiovascular Disease

## 2019-06-06 ENCOUNTER — Ambulatory Visit (INDEPENDENT_AMBULATORY_CARE_PROVIDER_SITE_OTHER): Payer: Medicare Other | Admitting: Cardiovascular Disease

## 2019-06-06 ENCOUNTER — Telehealth: Payer: Self-pay | Admitting: Cardiovascular Disease

## 2019-06-06 ENCOUNTER — Encounter: Payer: Self-pay | Admitting: Cardiovascular Disease

## 2019-06-06 ENCOUNTER — Other Ambulatory Visit: Payer: Self-pay

## 2019-06-06 VITALS — BP 154/77 | HR 74 | Ht 60.0 in | Wt 171.2 lb

## 2019-06-06 DIAGNOSIS — D6859 Other primary thrombophilia: Secondary | ICD-10-CM

## 2019-06-06 DIAGNOSIS — Z86711 Personal history of pulmonary embolism: Secondary | ICD-10-CM

## 2019-06-06 DIAGNOSIS — I4821 Permanent atrial fibrillation: Secondary | ICD-10-CM | POA: Diagnosis not present

## 2019-06-06 DIAGNOSIS — I429 Cardiomyopathy, unspecified: Secondary | ICD-10-CM

## 2019-06-06 DIAGNOSIS — I5023 Acute on chronic systolic (congestive) heart failure: Secondary | ICD-10-CM | POA: Diagnosis not present

## 2019-06-06 DIAGNOSIS — I10 Essential (primary) hypertension: Secondary | ICD-10-CM

## 2019-06-06 MED ORDER — POTASSIUM CHLORIDE CRYS ER 20 MEQ PO TBCR: 20.0000 meq | EXTENDED_RELEASE_TABLET | Freq: Every day | ORAL | 6 refills | Status: DC

## 2019-06-06 NOTE — Patient Instructions (Signed)
Medication Instructions:   Increase Lasix to 40mg  twice a day x 4 days, then back to previous daily dosing.    Increase Potassium to 71meq daily - new 28meq tab sent to pharmacy today.  Continue all other medications.    Labwork: BMET - order given today.  Testing/Procedures: Your physician has requested that you have an echocardiogram. Echocardiography is a painless test that uses sound waves to create images of your heart. It provides your doctor with information about the size and shape of your heart and how well your heart's chambers and valves are working. This procedure takes approximately one hour. There are no restrictions for this procedure.  Follow-Up:  Office will contact with results via phone or letter.    2 months   Any Other Special Instructions Will Be Listed Below (If Applicable).  If you need a refill on your cardiac medications before your next appointment, please call your pharmacy.

## 2019-06-06 NOTE — Progress Notes (Signed)
SUBJECTIVE: The patient presents for routine follow-up of chronic systolic heart failure, permanent atrial fibrillation, and mitral regurgitation.  Echocardiogram performed on 05/14/16 reportedly demonstrated severely reduced left ventricular systolic function, LVEF 68-34%, mild LVH, mild mitral and tricuspid regurgitation, moderate left atrial and mild right atrial enlargement, and mildly elevated pulmonary pressures.  Nuclear stress test 06/11/16 did not show any large ischemic territories. There was a small, mild intensity, apical anterior defect that was partially reversible and thought related to attenuation artifact versus a minor region of ischemia.  She has a history of protein S deficiency and pulmonary embolism. She has venous varicosities.  ECG performed today which I ordered and personally interpreted demonstrates rate controlled atrial fibrillation.  She normally weighs 163 pounds on her scales but this has gone up to 166 pounds in the last few days.  She has taken 80 mg of Lasix over the past 3 days.  She denies shortness of breath, orthopnea, and PND.  She lives alone.  2 sisters live locally and another sister lives in [place], [place].  They all got together for Christmas.  Review of Systems: As per "subjective", otherwise negative.  Allergies  Allergen Reactions  . Benadryl [Diphenhydramine Hcl (Sleep)] Other (See Comments)    "Makes me wild acting"  . Penicillins     rash  . Streptomycin Sulfate [Streptomycin]     rash    Current Outpatient Medications  Medication Sig Dispense Refill  . calcium carbonate (OS-CAL) 600 MG TABS tablet Take 1 tablet by mouth 2 (two) times daily.    . carvedilol (COREG) 12.5 MG tablet Take 1 tablet by mouth 2 (two) times daily.    . cetirizine (ZYRTEC) 10 MG tablet Take 1 tablet by mouth daily.    . Diclofenac Sodium (DICLO GEL TD) Place onto the skin.    Marland Kitchen docusate sodium (COLACE) 100 MG capsule Take 1 capsule by  mouth 3 (three) times daily as needed.    . furosemide (LASIX) 40 MG tablet Take 40 mg by mouth daily.     Marland Kitchen glimepiride (AMARYL) 2 MG tablet Take 1 tablet by mouth daily with breakfast.     . metFORMIN (GLUCOPHAGE) 500 MG tablet Take 1,000 mg by mouth 2 (two) times daily.     . Multiple Vitamins-Minerals (MULTIVITAMIN & MINERAL PO) Take 1 tablet by mouth daily.    . potassium chloride (K-DUR,KLOR-CON) 10 MEQ tablet Take 10 mEq by mouth daily.    . rosuvastatin (CRESTOR) 5 MG tablet Take 5 mg by mouth daily at 6 PM.    . sacubitril-valsartan (ENTRESTO) 24-26 MG Take 1 tablet by mouth 2 (two) times daily. 60 tablet 6  . warfarin (COUMADIN) 2.5 MG tablet Take by mouth.     No current facility-administered medications for this visit.     Past Medical History:  Diagnosis Date  . CHF (congestive heart failure) ([place])   . Diabetes mellitus without complication ([place])   . Dyslipidemia     Past Surgical History:  Procedure Laterality Date  . BREAST BIOPSY    . CATARACT EXTRACTION      Social History   Socioeconomic History  . Marital status: Widowed    Spouse name: Not on file  . Number of children: Not on file  . Years of education: Not on file  . Highest education level: Not on file  Occupational History  . Not on file  Social Needs  . Financial resource strain: Not on file  .  Food insecurity    Worry: Not on file    Inability: Not on file  . Transportation needs    Medical: Not on file    Non-medical: Not on file  Tobacco Use  . Smoking status: Never Smoker  . Smokeless tobacco: Never Used  Substance and Sexual Activity  . Alcohol use: No    Alcohol/week: 0.0 standard drinks  . Drug use: No  . Sexual activity: Not on file  Lifestyle  . Physical activity    Days per week: Not on file    Minutes per session: Not on file  . Stress: Not on file  Relationships  . Social Musician on phone: Not on file    Gets together: Not on file    Attends religious  service: Not on file    Active member of club or organization: Not on file    Attends meetings of clubs or organizations: Not on file    Relationship status: Not on file  . Intimate partner violence    Fear of current or ex partner: Not on file    Emotionally abused: Not on file    Physically abused: Not on file    Forced sexual activity: Not on file  Other Topics Concern  . Not on file  Social History Narrative  . Not on file     Vitals:   06/06/19 1432  BP: (!) 154/77  Pulse: 74  SpO2: 100%  Weight: 171 lb 3.2 oz (77.7 kg)  Height: 5' (1.524 m)    Wt Readings from Last 3 Encounters:  06/06/19 171 lb 3.2 oz (77.7 kg)  12/01/18 184 lb (83.5 kg)  09/22/18 186 lb (84.4 kg)     PHYSICAL EXAM General: NAD, frail-appearing HEENT: Normal. Neck: No JVD, no thyromegaly. Lungs: Clear to auscultation bilaterally with normal respiratory effort. CV: Regular rate and irregular rhythm, normal S1/S2, no S3/S4, no murmur.  1+ pitting bilateral lower extremity edema.   Abdomen: Soft, nontender, no distention.  Neurologic: Alert and oriented.  Psych: Normal affect. Skin: Normal. Musculoskeletal: No gross deformities.    ECG: Reviewed above under Subjective   Labs: Lab Results  Component Value Date/Time   K 4.4 04/20/2017 03:05 PM   BUN 30 (H) 04/20/2017 03:05 PM   CREATININE 1.27 (H) 04/20/2017 03:05 PM   ALT 13 04/20/2017 03:05 PM   HGB 11.5 (L) 04/20/2017 03:05 PM     Lipids: No results found for: LDLCALC, LDLDIRECT, CHOL, TRIG, HDL     ASSESSMENT AND PLAN:  1.  Acute on chronic systolic heart failure: Weight is up 3 pounds from her baseline at home with increasing bilateral leg edema.  I will have her take Lasix 40 mg twice daily for the next 4 days and I will increase supplemental potassium.  I will obtain an echocardiogram to assess for interval changes in cardiac structure and function.  I will obtain a basic metabolic panel.   This is presumably nonischemic in  etiology.NYHA class II symptoms.Weight down from January 2020. Currently on carvedilol 12.5 mg twice daily and Entresto24-26 mg twice daily.She developed relative hypotension, weakness, and dizziness when taking 49-51 mg twice daily.    2.  Permanent atrial fibrillation: Anticoagulated with warfarin.  INR monitored by PCP.  HR controlled with Coreg.  3. Essential HTN:  BP is elevated.  When I increased Entresto to 49-51 mg twice daily, she became relatively hypotensive with complaints of weakness and dizziness.  I will monitor given  increased diuretic requirement.  4. Pulmonary embolism/protein S deficiency: Continue warfarin.  INR monitored by PCP.   Disposition: Follow up 2 months with APP (virtual visit)   Prentice Docker, M.D., F.A.C.C.

## 2019-06-06 NOTE — Telephone Encounter (Signed)
°  Precert needed for: Echo     Location: [facility]     Date: June 08, 2019

## 2019-06-07 DIAGNOSIS — I429 Cardiomyopathy, unspecified: Secondary | ICD-10-CM | POA: Diagnosis not present

## 2019-06-07 DIAGNOSIS — Z299 Encounter for prophylactic measures, unspecified: Secondary | ICD-10-CM | POA: Diagnosis not present

## 2019-06-07 DIAGNOSIS — I2699 Other pulmonary embolism without acute cor pulmonale: Secondary | ICD-10-CM | POA: Diagnosis not present

## 2019-06-07 DIAGNOSIS — Z6832 Body mass index (BMI) 32.0-32.9, adult: Secondary | ICD-10-CM | POA: Diagnosis not present

## 2019-06-07 DIAGNOSIS — E1165 Type 2 diabetes mellitus with hyperglycemia: Secondary | ICD-10-CM | POA: Diagnosis not present

## 2019-06-07 DIAGNOSIS — I509 Heart failure, unspecified: Secondary | ICD-10-CM | POA: Diagnosis not present

## 2019-06-07 DIAGNOSIS — I10 Essential (primary) hypertension: Secondary | ICD-10-CM | POA: Diagnosis not present

## 2019-06-08 ENCOUNTER — Ambulatory Visit (INDEPENDENT_AMBULATORY_CARE_PROVIDER_SITE_OTHER): Payer: Medicare Other

## 2019-06-08 ENCOUNTER — Other Ambulatory Visit: Payer: Self-pay

## 2019-06-08 DIAGNOSIS — I5023 Acute on chronic systolic (congestive) heart failure: Secondary | ICD-10-CM

## 2019-06-10 ENCOUNTER — Telehealth: Payer: Self-pay | Admitting: *Deleted

## 2019-06-10 NOTE — Telephone Encounter (Signed)
Pt voiced understanding - routed to pcp  

## 2019-06-10 NOTE — Telephone Encounter (Signed)
-----   Message from Herminio Commons, MD sent at 06/08/2019  4:51 PM EDT ----- Cardiac function is mildly reduced but significantly improved when compared to last echo in 2017.  There was significant mitral valve leakage.  I will continue to manage medically.

## 2019-06-13 ENCOUNTER — Telehealth: Payer: Self-pay | Admitting: *Deleted

## 2019-06-13 NOTE — Telephone Encounter (Signed)
Notes recorded by Laurine Blazer, LPN on 06/11/9561 at 1:30 PM EDT  Patient notified via detailed voice message. Copy to pmd.  ------   Notes recorded by Herminio Commons, MD on 06/09/2019 at 7:46 PM EDT  Stable renal function.

## 2019-06-20 DIAGNOSIS — Z79899 Other long term (current) drug therapy: Secondary | ICD-10-CM | POA: Diagnosis not present

## 2019-06-20 DIAGNOSIS — I10 Essential (primary) hypertension: Secondary | ICD-10-CM | POA: Diagnosis not present

## 2019-06-20 DIAGNOSIS — M069 Rheumatoid arthritis, unspecified: Secondary | ICD-10-CM | POA: Diagnosis not present

## 2019-06-20 DIAGNOSIS — R52 Pain, unspecified: Secondary | ICD-10-CM | POA: Diagnosis not present

## 2019-06-20 DIAGNOSIS — R609 Edema, unspecified: Secondary | ICD-10-CM | POA: Diagnosis not present

## 2019-06-20 DIAGNOSIS — Z86711 Personal history of pulmonary embolism: Secondary | ICD-10-CM | POA: Diagnosis not present

## 2019-06-20 DIAGNOSIS — S9032XA Contusion of left foot, initial encounter: Secondary | ICD-10-CM | POA: Diagnosis not present

## 2019-06-20 DIAGNOSIS — T07XXXA Unspecified multiple injuries, initial encounter: Secondary | ICD-10-CM | POA: Diagnosis not present

## 2019-06-20 DIAGNOSIS — M25775 Osteophyte, left foot: Secondary | ICD-10-CM | POA: Diagnosis not present

## 2019-06-20 DIAGNOSIS — M25572 Pain in left ankle and joints of left foot: Secondary | ICD-10-CM | POA: Diagnosis not present

## 2019-06-20 DIAGNOSIS — M353 Polymyalgia rheumatica: Secondary | ICD-10-CM | POA: Diagnosis not present

## 2019-06-20 DIAGNOSIS — S99912A Unspecified injury of left ankle, initial encounter: Secondary | ICD-10-CM | POA: Diagnosis not present

## 2019-06-20 DIAGNOSIS — W109XXA Fall (on) (from) unspecified stairs and steps, initial encounter: Secondary | ICD-10-CM | POA: Diagnosis not present

## 2019-06-20 DIAGNOSIS — Z209 Contact with and (suspected) exposure to unspecified communicable disease: Secondary | ICD-10-CM | POA: Diagnosis not present

## 2019-06-20 DIAGNOSIS — Z7901 Long term (current) use of anticoagulants: Secondary | ICD-10-CM | POA: Diagnosis not present

## 2019-06-20 DIAGNOSIS — E119 Type 2 diabetes mellitus without complications: Secondary | ICD-10-CM | POA: Diagnosis not present

## 2019-06-20 DIAGNOSIS — Z880 Allergy status to penicillin: Secondary | ICD-10-CM | POA: Diagnosis not present

## 2019-06-20 DIAGNOSIS — S93402A Sprain of unspecified ligament of left ankle, initial encounter: Secondary | ICD-10-CM | POA: Diagnosis not present

## 2019-07-01 DIAGNOSIS — Z20828 Contact with and (suspected) exposure to other viral communicable diseases: Secondary | ICD-10-CM | POA: Diagnosis not present

## 2019-07-01 DIAGNOSIS — R509 Fever, unspecified: Secondary | ICD-10-CM | POA: Diagnosis not present

## 2019-07-01 DIAGNOSIS — J209 Acute bronchitis, unspecified: Secondary | ICD-10-CM | POA: Diagnosis not present

## 2019-07-01 DIAGNOSIS — R52 Pain, unspecified: Secondary | ICD-10-CM | POA: Diagnosis not present

## 2019-07-01 DIAGNOSIS — Z86711 Personal history of pulmonary embolism: Secondary | ICD-10-CM | POA: Diagnosis not present

## 2019-07-01 DIAGNOSIS — R0902 Hypoxemia: Secondary | ICD-10-CM | POA: Diagnosis not present

## 2019-07-01 DIAGNOSIS — J449 Chronic obstructive pulmonary disease, unspecified: Secondary | ICD-10-CM | POA: Diagnosis not present

## 2019-07-01 DIAGNOSIS — D6859 Other primary thrombophilia: Secondary | ICD-10-CM | POA: Diagnosis not present

## 2019-07-01 DIAGNOSIS — R0689 Other abnormalities of breathing: Secondary | ICD-10-CM | POA: Diagnosis not present

## 2019-07-01 DIAGNOSIS — Z7901 Long term (current) use of anticoagulants: Secondary | ICD-10-CM | POA: Diagnosis not present

## 2019-07-01 DIAGNOSIS — J440 Chronic obstructive pulmonary disease with acute lower respiratory infection: Secondary | ICD-10-CM | POA: Diagnosis not present

## 2019-07-01 DIAGNOSIS — Z209 Contact with and (suspected) exposure to unspecified communicable disease: Secondary | ICD-10-CM | POA: Diagnosis not present

## 2019-07-01 DIAGNOSIS — I4891 Unspecified atrial fibrillation: Secondary | ICD-10-CM | POA: Diagnosis not present

## 2019-07-02 DIAGNOSIS — J440 Chronic obstructive pulmonary disease with acute lower respiratory infection: Secondary | ICD-10-CM | POA: Diagnosis not present

## 2019-07-02 DIAGNOSIS — D6859 Other primary thrombophilia: Secondary | ICD-10-CM | POA: Diagnosis not present

## 2019-07-02 DIAGNOSIS — J209 Acute bronchitis, unspecified: Secondary | ICD-10-CM | POA: Diagnosis not present

## 2019-07-02 DIAGNOSIS — J449 Chronic obstructive pulmonary disease, unspecified: Secondary | ICD-10-CM | POA: Diagnosis not present

## 2019-07-03 DIAGNOSIS — D6859 Other primary thrombophilia: Secondary | ICD-10-CM | POA: Diagnosis not present

## 2019-07-03 DIAGNOSIS — J449 Chronic obstructive pulmonary disease, unspecified: Secondary | ICD-10-CM | POA: Diagnosis not present

## 2019-07-03 DIAGNOSIS — J440 Chronic obstructive pulmonary disease with acute lower respiratory infection: Secondary | ICD-10-CM | POA: Diagnosis not present

## 2019-07-03 DIAGNOSIS — J209 Acute bronchitis, unspecified: Secondary | ICD-10-CM | POA: Diagnosis not present

## 2019-07-04 DIAGNOSIS — J209 Acute bronchitis, unspecified: Secondary | ICD-10-CM | POA: Diagnosis not present

## 2019-07-04 DIAGNOSIS — J440 Chronic obstructive pulmonary disease with acute lower respiratory infection: Secondary | ICD-10-CM | POA: Diagnosis not present

## 2019-07-04 DIAGNOSIS — J449 Chronic obstructive pulmonary disease, unspecified: Secondary | ICD-10-CM | POA: Diagnosis not present

## 2019-07-04 DIAGNOSIS — D6859 Other primary thrombophilia: Secondary | ICD-10-CM | POA: Diagnosis not present

## 2019-07-05 DIAGNOSIS — M6281 Muscle weakness (generalized): Secondary | ICD-10-CM | POA: Diagnosis not present

## 2019-07-05 DIAGNOSIS — Z86711 Personal history of pulmonary embolism: Secondary | ICD-10-CM | POA: Diagnosis not present

## 2019-07-05 DIAGNOSIS — J9611 Chronic respiratory failure with hypoxia: Secondary | ICD-10-CM | POA: Diagnosis not present

## 2019-07-05 DIAGNOSIS — E119 Type 2 diabetes mellitus without complications: Secondary | ICD-10-CM | POA: Diagnosis present

## 2019-07-05 DIAGNOSIS — J209 Acute bronchitis, unspecified: Secondary | ICD-10-CM | POA: Diagnosis present

## 2019-07-05 DIAGNOSIS — R509 Fever, unspecified: Secondary | ICD-10-CM | POA: Diagnosis not present

## 2019-07-05 DIAGNOSIS — R2689 Other abnormalities of gait and mobility: Secondary | ICD-10-CM | POA: Diagnosis not present

## 2019-07-05 DIAGNOSIS — J440 Chronic obstructive pulmonary disease with acute lower respiratory infection: Secondary | ICD-10-CM | POA: Diagnosis present

## 2019-07-05 DIAGNOSIS — Z7984 Long term (current) use of oral hypoglycemic drugs: Secondary | ICD-10-CM | POA: Diagnosis not present

## 2019-07-05 DIAGNOSIS — Z880 Allergy status to penicillin: Secondary | ICD-10-CM | POA: Diagnosis not present

## 2019-07-05 DIAGNOSIS — Z20828 Contact with and (suspected) exposure to other viral communicable diseases: Secondary | ICD-10-CM | POA: Diagnosis present

## 2019-07-05 DIAGNOSIS — Z7901 Long term (current) use of anticoagulants: Secondary | ICD-10-CM | POA: Diagnosis not present

## 2019-07-05 DIAGNOSIS — R531 Weakness: Secondary | ICD-10-CM | POA: Diagnosis present

## 2019-07-05 DIAGNOSIS — I10 Essential (primary) hypertension: Secondary | ICD-10-CM | POA: Diagnosis present

## 2019-07-05 DIAGNOSIS — J441 Chronic obstructive pulmonary disease with (acute) exacerbation: Secondary | ICD-10-CM | POA: Diagnosis not present

## 2019-07-05 DIAGNOSIS — D6859 Other primary thrombophilia: Secondary | ICD-10-CM | POA: Diagnosis present

## 2019-07-05 DIAGNOSIS — R41841 Cognitive communication deficit: Secondary | ICD-10-CM | POA: Diagnosis not present

## 2019-07-05 DIAGNOSIS — J449 Chronic obstructive pulmonary disease, unspecified: Secondary | ICD-10-CM | POA: Diagnosis not present

## 2019-07-05 DIAGNOSIS — M549 Dorsalgia, unspecified: Secondary | ICD-10-CM | POA: Diagnosis not present

## 2019-07-09 DIAGNOSIS — R079 Chest pain, unspecified: Secondary | ICD-10-CM | POA: Diagnosis not present

## 2019-07-09 DIAGNOSIS — R7989 Other specified abnormal findings of blood chemistry: Secondary | ICD-10-CM | POA: Diagnosis not present

## 2019-07-09 DIAGNOSIS — Z880 Allergy status to penicillin: Secondary | ICD-10-CM | POA: Diagnosis not present

## 2019-07-09 DIAGNOSIS — R918 Other nonspecific abnormal finding of lung field: Secondary | ICD-10-CM | POA: Diagnosis not present

## 2019-07-09 DIAGNOSIS — I4821 Permanent atrial fibrillation: Secondary | ICD-10-CM | POA: Diagnosis present

## 2019-07-09 DIAGNOSIS — J441 Chronic obstructive pulmonary disease with (acute) exacerbation: Secondary | ICD-10-CM | POA: Diagnosis not present

## 2019-07-09 DIAGNOSIS — J449 Chronic obstructive pulmonary disease, unspecified: Secondary | ICD-10-CM | POA: Diagnosis not present

## 2019-07-09 DIAGNOSIS — I2510 Atherosclerotic heart disease of native coronary artery without angina pectoris: Secondary | ICD-10-CM | POA: Diagnosis present

## 2019-07-09 DIAGNOSIS — Z66 Do not resuscitate: Secondary | ICD-10-CM | POA: Diagnosis present

## 2019-07-09 DIAGNOSIS — D649 Anemia, unspecified: Secondary | ICD-10-CM | POA: Diagnosis present

## 2019-07-09 DIAGNOSIS — Z7401 Bed confinement status: Secondary | ICD-10-CM | POA: Diagnosis not present

## 2019-07-09 DIAGNOSIS — Z7901 Long term (current) use of anticoagulants: Secondary | ICD-10-CM | POA: Diagnosis not present

## 2019-07-09 DIAGNOSIS — R0902 Hypoxemia: Secondary | ICD-10-CM | POA: Diagnosis not present

## 2019-07-09 DIAGNOSIS — R2689 Other abnormalities of gait and mobility: Secondary | ICD-10-CM | POA: Diagnosis not present

## 2019-07-09 DIAGNOSIS — I5023 Acute on chronic systolic (congestive) heart failure: Secondary | ICD-10-CM | POA: Diagnosis present

## 2019-07-09 DIAGNOSIS — I420 Dilated cardiomyopathy: Secondary | ICD-10-CM | POA: Diagnosis not present

## 2019-07-09 DIAGNOSIS — K5730 Diverticulosis of large intestine without perforation or abscess without bleeding: Secondary | ICD-10-CM | POA: Diagnosis not present

## 2019-07-09 DIAGNOSIS — I517 Cardiomegaly: Secondary | ICD-10-CM | POA: Diagnosis not present

## 2019-07-09 DIAGNOSIS — Z7984 Long term (current) use of oral hypoglycemic drugs: Secondary | ICD-10-CM | POA: Diagnosis not present

## 2019-07-09 DIAGNOSIS — Z8679 Personal history of other diseases of the circulatory system: Secondary | ICD-10-CM | POA: Diagnosis not present

## 2019-07-09 DIAGNOSIS — N183 Chronic kidney disease, stage 3 (moderate): Secondary | ICD-10-CM | POA: Diagnosis present

## 2019-07-09 DIAGNOSIS — Z881 Allergy status to other antibiotic agents status: Secondary | ICD-10-CM | POA: Diagnosis not present

## 2019-07-09 DIAGNOSIS — J9611 Chronic respiratory failure with hypoxia: Secondary | ICD-10-CM | POA: Diagnosis not present

## 2019-07-09 DIAGNOSIS — I10 Essential (primary) hypertension: Secondary | ICD-10-CM | POA: Diagnosis not present

## 2019-07-09 DIAGNOSIS — Z86718 Personal history of other venous thrombosis and embolism: Secondary | ICD-10-CM | POA: Diagnosis not present

## 2019-07-09 DIAGNOSIS — R509 Fever, unspecified: Secondary | ICD-10-CM | POA: Diagnosis not present

## 2019-07-09 DIAGNOSIS — R41841 Cognitive communication deficit: Secondary | ICD-10-CM | POA: Diagnosis not present

## 2019-07-09 DIAGNOSIS — Z79899 Other long term (current) drug therapy: Secondary | ICD-10-CM | POA: Diagnosis not present

## 2019-07-09 DIAGNOSIS — M069 Rheumatoid arthritis, unspecified: Secondary | ICD-10-CM | POA: Diagnosis not present

## 2019-07-09 DIAGNOSIS — Z888 Allergy status to other drugs, medicaments and biological substances status: Secondary | ICD-10-CM | POA: Diagnosis not present

## 2019-07-09 DIAGNOSIS — R0602 Shortness of breath: Secondary | ICD-10-CM | POA: Diagnosis not present

## 2019-07-09 DIAGNOSIS — J209 Acute bronchitis, unspecified: Secondary | ICD-10-CM | POA: Diagnosis not present

## 2019-07-09 DIAGNOSIS — I509 Heart failure, unspecified: Secondary | ICD-10-CM | POA: Diagnosis not present

## 2019-07-09 DIAGNOSIS — Z86711 Personal history of pulmonary embolism: Secondary | ICD-10-CM | POA: Diagnosis not present

## 2019-07-09 DIAGNOSIS — Z91048 Other nonmedicinal substance allergy status: Secondary | ICD-10-CM | POA: Diagnosis not present

## 2019-07-09 DIAGNOSIS — I110 Hypertensive heart disease with heart failure: Secondary | ICD-10-CM | POA: Diagnosis not present

## 2019-07-09 DIAGNOSIS — Z5181 Encounter for therapeutic drug level monitoring: Secondary | ICD-10-CM | POA: Diagnosis not present

## 2019-07-09 DIAGNOSIS — E1165 Type 2 diabetes mellitus with hyperglycemia: Secondary | ICD-10-CM | POA: Diagnosis not present

## 2019-07-09 DIAGNOSIS — J189 Pneumonia, unspecified organism: Secondary | ICD-10-CM | POA: Diagnosis present

## 2019-07-09 DIAGNOSIS — R52 Pain, unspecified: Secondary | ICD-10-CM | POA: Diagnosis not present

## 2019-07-09 DIAGNOSIS — I340 Nonrheumatic mitral (valve) insufficiency: Secondary | ICD-10-CM | POA: Diagnosis present

## 2019-07-09 DIAGNOSIS — Z7952 Long term (current) use of systemic steroids: Secondary | ICD-10-CM | POA: Diagnosis not present

## 2019-07-09 DIAGNOSIS — E785 Hyperlipidemia, unspecified: Secondary | ICD-10-CM | POA: Diagnosis present

## 2019-07-09 DIAGNOSIS — J440 Chronic obstructive pulmonary disease with acute lower respiratory infection: Secondary | ICD-10-CM | POA: Diagnosis not present

## 2019-07-09 DIAGNOSIS — I214 Non-ST elevation (NSTEMI) myocardial infarction: Secondary | ICD-10-CM | POA: Diagnosis present

## 2019-07-09 DIAGNOSIS — I130 Hypertensive heart and chronic kidney disease with heart failure and stage 1 through stage 4 chronic kidney disease, or unspecified chronic kidney disease: Secondary | ICD-10-CM | POA: Diagnosis present

## 2019-07-09 DIAGNOSIS — M6281 Muscle weakness (generalized): Secondary | ICD-10-CM | POA: Diagnosis not present

## 2019-07-09 DIAGNOSIS — E782 Mixed hyperlipidemia: Secondary | ICD-10-CM | POA: Diagnosis not present

## 2019-07-09 DIAGNOSIS — Z23 Encounter for immunization: Secondary | ICD-10-CM | POA: Diagnosis not present

## 2019-07-09 DIAGNOSIS — D6859 Other primary thrombophilia: Secondary | ICD-10-CM | POA: Diagnosis present

## 2019-07-09 DIAGNOSIS — R1110 Vomiting, unspecified: Secondary | ICD-10-CM | POA: Diagnosis present

## 2019-07-09 DIAGNOSIS — E119 Type 2 diabetes mellitus without complications: Secondary | ICD-10-CM | POA: Diagnosis not present

## 2019-07-09 DIAGNOSIS — Z20828 Contact with and (suspected) exposure to other viral communicable diseases: Secondary | ICD-10-CM | POA: Diagnosis present

## 2019-07-09 DIAGNOSIS — J9601 Acute respiratory failure with hypoxia: Secondary | ICD-10-CM | POA: Diagnosis not present

## 2019-07-09 DIAGNOSIS — M353 Polymyalgia rheumatica: Secondary | ICD-10-CM | POA: Diagnosis present

## 2019-07-09 DIAGNOSIS — R531 Weakness: Secondary | ICD-10-CM | POA: Diagnosis not present

## 2019-07-09 DIAGNOSIS — I4891 Unspecified atrial fibrillation: Secondary | ICD-10-CM | POA: Diagnosis not present

## 2019-07-09 DIAGNOSIS — I959 Hypotension, unspecified: Secondary | ICD-10-CM | POA: Diagnosis not present

## 2019-07-09 DIAGNOSIS — R05 Cough: Secondary | ICD-10-CM | POA: Diagnosis not present

## 2019-07-10 DIAGNOSIS — K5730 Diverticulosis of large intestine without perforation or abscess without bleeding: Secondary | ICD-10-CM | POA: Diagnosis not present

## 2019-07-10 DIAGNOSIS — E782 Mixed hyperlipidemia: Secondary | ICD-10-CM | POA: Diagnosis not present

## 2019-07-10 DIAGNOSIS — I10 Essential (primary) hypertension: Secondary | ICD-10-CM | POA: Diagnosis not present

## 2019-07-10 DIAGNOSIS — E119 Type 2 diabetes mellitus without complications: Secondary | ICD-10-CM | POA: Diagnosis not present

## 2019-07-12 DIAGNOSIS — R509 Fever, unspecified: Secondary | ICD-10-CM | POA: Diagnosis not present

## 2019-07-12 DIAGNOSIS — I509 Heart failure, unspecified: Secondary | ICD-10-CM | POA: Diagnosis not present

## 2019-07-12 DIAGNOSIS — J449 Chronic obstructive pulmonary disease, unspecified: Secondary | ICD-10-CM | POA: Diagnosis not present

## 2019-07-19 DIAGNOSIS — Z5181 Encounter for therapeutic drug level monitoring: Secondary | ICD-10-CM | POA: Diagnosis not present

## 2019-07-19 DIAGNOSIS — Z7901 Long term (current) use of anticoagulants: Secondary | ICD-10-CM | POA: Diagnosis not present

## 2019-07-21 ENCOUNTER — Other Ambulatory Visit: Payer: Self-pay | Admitting: *Deleted

## 2019-07-21 DIAGNOSIS — R05 Cough: Secondary | ICD-10-CM | POA: Diagnosis not present

## 2019-07-21 NOTE — Patient Outreach (Signed)
Member assessed for potential [facility] Care Management needs as a benefit of  [place] [employer].  Member is currently receiving rehab therapy at [facility].  Member discussed in weekly telephonic IDT meeting with facility staff, [facility] UM team, and writer.  Facility reports Rebecca Hanson lived alone independently prior. Her sister is listed as primary contact. However, member's sister Rebecca Hanson reportedly is very hard of hearing. Therefore, member's niece has been assisting in discharge planning process with facility. Per facility, Rebecca Hanson's niece is looking for long term care policy.   Discussed that Probation officer will outreach to [employer] niece to discuss potential [facility] Care Management services.  Addendum: Facility discharge planner provided contact information for member's nieceLonia Hanson - [phone].  Rebecca Rolling, MSN-Ed, RN,BSN [place] Acute Care Coordinator [phone] [facility]) [phone]  (Toll free office)

## 2019-07-24 ENCOUNTER — Inpatient Hospital Stay (HOSPITAL_COMMUNITY)
Admission: AD | Admit: 2019-07-24 | Discharge: 2019-07-29 | DRG: 280 | Disposition: A | Discharge status: Skilled Nursing Facility | Payer: Medicare Other | Source: Other Acute Inpatient Hospital | Attending: Internal Medicine | Admitting: Internal Medicine

## 2019-07-24 ENCOUNTER — Inpatient Hospital Stay (HOSPITAL_COMMUNITY): Payer: Medicare Other

## 2019-07-24 DIAGNOSIS — Z7984 Long term (current) use of oral hypoglycemic drugs: Secondary | ICD-10-CM

## 2019-07-24 DIAGNOSIS — R7989 Other specified abnormal findings of blood chemistry: Secondary | ICD-10-CM | POA: Diagnosis not present

## 2019-07-24 DIAGNOSIS — Z86711 Personal history of pulmonary embolism: Secondary | ICD-10-CM

## 2019-07-24 DIAGNOSIS — M353 Polymyalgia rheumatica: Secondary | ICD-10-CM | POA: Diagnosis present

## 2019-07-24 DIAGNOSIS — I272 Pulmonary hypertension, unspecified: Secondary | ICD-10-CM | POA: Diagnosis present

## 2019-07-24 DIAGNOSIS — I130 Hypertensive heart and chronic kidney disease with heart failure and stage 1 through stage 4 chronic kidney disease, or unspecified chronic kidney disease: Principal | ICD-10-CM | POA: Diagnosis present

## 2019-07-24 DIAGNOSIS — Z888 Allergy status to other drugs, medicaments and biological substances status: Secondary | ICD-10-CM

## 2019-07-24 DIAGNOSIS — R918 Other nonspecific abnormal finding of lung field: Secondary | ICD-10-CM | POA: Diagnosis not present

## 2019-07-24 DIAGNOSIS — Z86718 Personal history of other venous thrombosis and embolism: Secondary | ICD-10-CM

## 2019-07-24 DIAGNOSIS — I4821 Permanent atrial fibrillation: Secondary | ICD-10-CM | POA: Diagnosis present

## 2019-07-24 DIAGNOSIS — Z823 Family history of stroke: Secondary | ICD-10-CM

## 2019-07-24 DIAGNOSIS — I10 Essential (primary) hypertension: Secondary | ICD-10-CM | POA: Diagnosis present

## 2019-07-24 DIAGNOSIS — Z79899 Other long term (current) drug therapy: Secondary | ICD-10-CM

## 2019-07-24 DIAGNOSIS — I2510 Atherosclerotic heart disease of native coronary artery without angina pectoris: Secondary | ICD-10-CM | POA: Diagnosis present

## 2019-07-24 DIAGNOSIS — Z7952 Long term (current) use of systemic steroids: Secondary | ICD-10-CM

## 2019-07-24 DIAGNOSIS — J189 Pneumonia, unspecified organism: Secondary | ICD-10-CM | POA: Diagnosis present

## 2019-07-24 DIAGNOSIS — Z880 Allergy status to penicillin: Secondary | ICD-10-CM

## 2019-07-24 DIAGNOSIS — I110 Hypertensive heart disease with heart failure: Secondary | ICD-10-CM | POA: Diagnosis not present

## 2019-07-24 DIAGNOSIS — I340 Nonrheumatic mitral (valve) insufficiency: Secondary | ICD-10-CM | POA: Diagnosis present

## 2019-07-24 DIAGNOSIS — R079 Chest pain, unspecified: Secondary | ICD-10-CM | POA: Diagnosis present

## 2019-07-24 DIAGNOSIS — E785 Hyperlipidemia, unspecified: Secondary | ICD-10-CM | POA: Diagnosis present

## 2019-07-24 DIAGNOSIS — I509 Heart failure, unspecified: Secondary | ICD-10-CM | POA: Diagnosis not present

## 2019-07-24 DIAGNOSIS — D6859 Other primary thrombophilia: Secondary | ICD-10-CM | POA: Diagnosis present

## 2019-07-24 DIAGNOSIS — Z6830 Body mass index (BMI) 30.0-30.9, adult: Secondary | ICD-10-CM

## 2019-07-24 DIAGNOSIS — E669 Obesity, unspecified: Secondary | ICD-10-CM | POA: Diagnosis present

## 2019-07-24 DIAGNOSIS — I517 Cardiomegaly: Secondary | ICD-10-CM | POA: Diagnosis not present

## 2019-07-24 DIAGNOSIS — D696 Thrombocytopenia, unspecified: Secondary | ICD-10-CM | POA: Diagnosis present

## 2019-07-24 DIAGNOSIS — I214 Non-ST elevation (NSTEMI) myocardial infarction: Secondary | ICD-10-CM | POA: Diagnosis present

## 2019-07-24 DIAGNOSIS — Z7982 Long term (current) use of aspirin: Secondary | ICD-10-CM

## 2019-07-24 DIAGNOSIS — Z8249 Family history of ischemic heart disease and other diseases of the circulatory system: Secondary | ICD-10-CM

## 2019-07-24 DIAGNOSIS — I420 Dilated cardiomyopathy: Secondary | ICD-10-CM | POA: Diagnosis present

## 2019-07-24 DIAGNOSIS — Z8679 Personal history of other diseases of the circulatory system: Secondary | ICD-10-CM

## 2019-07-24 DIAGNOSIS — N183 Chronic kidney disease, stage 3 (moderate): Secondary | ICD-10-CM | POA: Diagnosis present

## 2019-07-24 DIAGNOSIS — Z91048 Other nonmedicinal substance allergy status: Secondary | ICD-10-CM

## 2019-07-24 DIAGNOSIS — J9601 Acute respiratory failure with hypoxia: Secondary | ICD-10-CM | POA: Diagnosis not present

## 2019-07-24 DIAGNOSIS — I959 Hypotension, unspecified: Secondary | ICD-10-CM | POA: Diagnosis present

## 2019-07-24 DIAGNOSIS — E1165 Type 2 diabetes mellitus with hyperglycemia: Secondary | ICD-10-CM | POA: Diagnosis not present

## 2019-07-24 DIAGNOSIS — R0902 Hypoxemia: Secondary | ICD-10-CM | POA: Diagnosis not present

## 2019-07-24 DIAGNOSIS — Z23 Encounter for immunization: Secondary | ICD-10-CM

## 2019-07-24 DIAGNOSIS — D649 Anemia, unspecified: Secondary | ICD-10-CM | POA: Diagnosis present

## 2019-07-24 DIAGNOSIS — Z881 Allergy status to other antibiotic agents status: Secondary | ICD-10-CM

## 2019-07-24 DIAGNOSIS — R531 Weakness: Secondary | ICD-10-CM | POA: Diagnosis not present

## 2019-07-24 DIAGNOSIS — R0600 Dyspnea, unspecified: Secondary | ICD-10-CM | POA: Diagnosis present

## 2019-07-24 DIAGNOSIS — Z7901 Long term (current) use of anticoagulants: Secondary | ICD-10-CM

## 2019-07-24 DIAGNOSIS — I4891 Unspecified atrial fibrillation: Secondary | ICD-10-CM | POA: Diagnosis not present

## 2019-07-24 DIAGNOSIS — R0602 Shortness of breath: Secondary | ICD-10-CM | POA: Diagnosis not present

## 2019-07-24 DIAGNOSIS — Z20828 Contact with and (suspected) exposure to other viral communicable diseases: Secondary | ICD-10-CM | POA: Diagnosis present

## 2019-07-24 DIAGNOSIS — I5023 Acute on chronic systolic (congestive) heart failure: Secondary | ICD-10-CM | POA: Diagnosis present

## 2019-07-24 DIAGNOSIS — Z66 Do not resuscitate: Secondary | ICD-10-CM | POA: Diagnosis present

## 2019-07-24 DIAGNOSIS — R778 Other specified abnormalities of plasma proteins: Secondary | ICD-10-CM | POA: Diagnosis present

## 2019-07-24 DIAGNOSIS — R1110 Vomiting, unspecified: Secondary | ICD-10-CM | POA: Diagnosis present

## 2019-07-24 LAB — COMPREHENSIVE METABOLIC PANEL
ALT: 12 U/L (ref 0–44)
AST: 26 U/L (ref 15–41)
Albumin: 2.7 g/dL — ABNORMAL LOW (ref 3.5–5.0)
Alkaline Phosphatase: 49 U/L (ref 38–126)
Anion gap: 10 (ref 5–15)
BUN: 25 mg/dL — ABNORMAL HIGH (ref 8–23)
CO2: 24 mmol/L (ref 22–32)
Calcium: 9.1 mg/dL (ref 8.9–10.3)
Chloride: 101 mmol/L (ref 98–111)
Creatinine, Ser: 1.25 mg/dL — ABNORMAL HIGH (ref 0.44–1.00)
GFR calc Af Amer: 45 mL/min — ABNORMAL LOW (ref >60)
GFR calc non Af Amer: 39 mL/min — ABNORMAL LOW (ref >60)
Glucose, Bld: 220 mg/dL — ABNORMAL HIGH (ref 70–99)
Potassium: 3.9 mmol/L (ref 3.5–5.1)
Sodium: 135 mmol/L (ref 135–145)
Total Bilirubin: 0.6 mg/dL (ref 0.3–1.2)
Total Protein: 5.4 g/dL — ABNORMAL LOW (ref 6.5–8.1)

## 2019-07-24 LAB — CBC WITH DIFFERENTIAL/PLATELET
Abs Immature Granulocytes: 0.03 10*3/uL (ref 0.00–0.07)
Basophils Absolute: 0 10*3/uL (ref 0.0–0.1)
Basophils Relative: 0 %
Eosinophils Absolute: 0.7 10*3/uL — ABNORMAL HIGH (ref 0.0–0.5)
Eosinophils Relative: 12 %
HCT: 28 % — ABNORMAL LOW (ref 36.0–46.0)
Hemoglobin: 9 g/dL — ABNORMAL LOW (ref 12.0–15.0)
Immature Granulocytes: 1 %
Lymphocytes Relative: 17 %
Lymphs Abs: 1 10*3/uL (ref 0.7–4.0)
MCH: 28 pg (ref 26.0–34.0)
MCHC: 32.1 g/dL (ref 30.0–36.0)
MCV: 87.2 fL (ref 80.0–100.0)
Monocytes Absolute: 0.3 10*3/uL (ref 0.1–1.0)
Monocytes Relative: 5 %
Neutro Abs: 4 10*3/uL (ref 1.7–7.7)
Neutrophils Relative %: 65 %
Platelets: 142 10*3/uL — ABNORMAL LOW (ref 150–400)
RBC: 3.21 MIL/uL — ABNORMAL LOW (ref 3.87–5.11)
RDW: 15.7 % — ABNORMAL HIGH (ref 11.5–15.5)
WBC: 6 10*3/uL (ref 4.0–10.5)
nRBC: 0 % (ref 0.0–0.2)

## 2019-07-24 LAB — PROTIME-INR
INR: 2.7 — ABNORMAL HIGH (ref 0.8–1.2)
Prothrombin Time: 28.3 seconds — ABNORMAL HIGH (ref 11.4–15.2)

## 2019-07-24 LAB — TROPONIN I (HIGH SENSITIVITY): Troponin I (High Sensitivity): 1310 ng/L (ref <18)

## 2019-07-24 LAB — SARS CORONAVIRUS 2 BY RT PCR (HOSPITAL ORDER, PERFORMED IN ~~LOC~~ HOSPITAL LAB): SARS Coronavirus 2: NEGATIVE

## 2019-07-24 LAB — BRAIN NATRIURETIC PEPTIDE: B Natriuretic Peptide: 459.9 pg/mL — ABNORMAL HIGH (ref 0.0–100.0)

## 2019-07-24 LAB — TSH: TSH: 0.661 u[IU]/mL (ref 0.350–4.500)

## 2019-07-24 LAB — GLUCOSE, CAPILLARY: Glucose-Capillary: 147 mg/dL — ABNORMAL HIGH (ref 70–99)

## 2019-07-24 LAB — MAGNESIUM: Magnesium: 1.2 mg/dL — ABNORMAL LOW (ref 1.7–2.4)

## 2019-07-24 MED ORDER — WARFARIN SODIUM 2 MG PO TABS
4.0000 mg | ORAL_TABLET | Freq: Once | ORAL | Status: AC
  Administered 2019-07-25: 4 mg via ORAL
  Filled 2019-07-24: qty 2

## 2019-07-24 MED ORDER — WARFARIN - PHARMACIST DOSING INPATIENT: Freq: Every day | Status: DC

## 2019-07-24 NOTE — Progress Notes (Signed)
CRITICAL VALUE STICKER  CRITICAL VALUE: troponin 1310  RECEIVER [name]  DATE & TIME NOTIFIED: 07/24/2019 @2314   Si Raider  MD NOTIFIED: DR Hal Hope DR Hassell Done  TIME OF NOTIFICATION:2314  RESPONSE: orders received

## 2019-07-24 NOTE — Progress Notes (Signed)
Pt arrived to 3e06 from [facility] via [employer], pt assisted to bed, paged admitting to alert of arrival and need for orders

## 2019-07-24 NOTE — Progress Notes (Addendum)
ANTICOAGULATION CONSULT NOTE - Initial Consult  Pharmacy Consult for Warfarin  Indication: Afib, hx of PE/protein S deficiency   Allergies  Allergen Reactions  . Benadryl [Diphenhydramine Hcl (Sleep)] Other (See Comments)    "Makes me wild, edgy acting"  . Hand Sanitizer [Ethyl Alcohol (Skin Cleanser)] Swelling    Severe swelling  . Adhesive [Tape] Rash  . Penicillins Hives, Swelling and Rash    Did it involve swelling of the face/tongue/throat, SOB, or low BP? Did it involve sudden or severe rash/hives, skin peeling, or any reaction on the inside of your mouth or nose?  Did you need to seek medical attention at a hospital or doctor's office?  When did it last happen? If all above answers are "NO", may proceed with cephalosporin use..  . Soap Rash  . Streptomycin Sulfate [Streptomycin] Hives, Swelling and Rash    Patient Measurements: Height: 5' (152.4 cm) Weight: 155 lb 6.8 oz (70.5 kg) IBW/kg (Calculated) : 45.5  Vital Signs: Temp: 98.2 F (36.8 C) (09/13 1902) Temp Source: Oral (09/13 1902) BP: 119/59 (09/13 1902) Pulse Rate: 75 (09/13 1902)  Labs: Recent Labs    07/24/19 2200  HGB 9.0*  HCT 28.0*  PLT 142*  LABPROT 28.3*  INR 2.7*  CREATININE 1.25*  TROPONINIHS 1,310*    Estimated Creatinine Clearance: 28.8 mL/min (A) (by C-G formula based on SCr of 1.25 mg/dL (H)).   Medical History: Past Medical History:  Diagnosis Date  . CHF (congestive heart failure) ([place])   . Diabetes mellitus without complication ([place])   . Dyslipidemia     Assessment: 85 y/o transfer from outside facility. On warfarin PTA for afib, hx PE, protein S deficiency. Pharmacy consulted to continue warfarin. INR is therapeutic at 2.7.   Outpatient dose per facility MAR: 4 mg daily   Goal of Therapy:  INR 2-3 Monitor platelets by anticoagulation protocol: Yes   Plan:  Warfarin 4 mg PO x 1 now Daily PT/INR Monitor for bleeding  Narda Bonds, PharmD, BCPS Clinical  Pharmacist Phone: [phone]

## 2019-07-24 NOTE — Consult Note (Addendum)
Cardiology Consultation   Patient ID: Rebecca Hanson; [id number]; [birth year]   Admit date: 07/24/2019 Date of Consult: 07/24/2019  Referring Provider:  Midge Minium MD  Primary Care Provider: Ignatius Specking, MD Cardiologist: Purvis Sheffield  Electrophysiologist:  NA  Reason for Consultation: elevated troponin (0.28)  History of Present Illness: Rebecca Hanson is a 85 y.o. female w/ h/o afib (chronic), chronic systolic HF with EF 30-35%, mild MR/TR, h/o PE and protein S deficiency, HTN, DM2, but no prior h/o CAD, who is being seen today for the evaluation of elevated/abnormal troponin at the request of Dr. Toniann Fail. The notes from transferring facility at [facility] are not complete, but it appears pt was transferred to that ED from her SNF earlier today due to dyspnea. She initially improved w/ IV lasix from a symptom standpoint, and was sent back to her facility. They later brought her back to the ED a second time as the SNF physician wanted further evaluation of mildly elevated troponin. Apparently initial trop was 0.05, then 0.14, then 0.28. Pt was in rate-controlled afib at OSH ED w/ HR in 80s. It appears from the notes that she was not having any significant sx at the time of the second ED visit (the sx had improved w/ the lasix earlier in the day). CXR even improved at the time of the second visit. Due to the abnormal troponin, a decision was made for pt to be transferred to [facility] for further evaluation. Labs here at [facility] are currently pending. Pt currently denies any CP or SOB. She is a poor historian.  Past Medical History:  Diagnosis Date  . CHF (congestive heart failure) (HCC)   . Diabetes mellitus without complication (HCC)   . Dyslipidemia     Past Surgical History:  Procedure Laterality Date  . BREAST BIOPSY    . CATARACT EXTRACTION       No current facility-administered medications on file prior to encounter.    Current Outpatient Medications on File Prior to  Encounter  Medication Sig Dispense Refill  . aspirin EC 81 MG tablet Take 324 mg by mouth once.    . OXYGEN Inhale 2 L/min into the lungs as needed (for shortness of breath or comfort).    Marland Kitchen atorvastatin (LIPITOR) 10 MG tablet Take 10 mg by mouth every evening.    . calcium carbonate (OS-CAL) 600 MG TABS tablet Take 1 tablet by mouth 2 (two) times daily.    . carvedilol (COREG) 12.5 MG tablet Take 12.5 mg by mouth 2 (two) times daily.     . cetirizine (ZYRTEC) 10 MG tablet Take 10 mg by mouth daily.     . Diclofenac Sodium (DICLO GEL TD) Place onto the skin.    Marland Kitchen docusate sodium (COLACE) 100 MG capsule Take 1 capsule by mouth 3 (three) times daily as needed.    . furosemide (LASIX) 40 MG tablet Take 40 mg by mouth daily.     Marland Kitchen glimepiride (AMARYL) 2 MG tablet Take 2 mg by mouth daily with breakfast.     . metFORMIN (GLUCOPHAGE) 500 MG tablet Take 500 mg by mouth 2 (two) times daily.     . Multiple Vitamins-Minerals (CENTRUM SILVER 50+WOMEN) TABS Take 1 tablet by mouth daily.    . potassium chloride (K-DUR) 20 MEQ tablet Take 1 tablet (20 mEq total) by mouth daily. 30 tablet 6  . predniSONE (DELTASONE) 5 MG tablet Take 5 mg by mouth 2 (two) times daily with a meal.    . rosuvastatin (  CRESTOR) 5 MG tablet Take 5 mg by mouth daily at 6 PM.    . sacubitril-valsartan (ENTRESTO) 24-26 MG Take 1 tablet by mouth 2 (two) times daily. 60 tablet 6  . warfarin (COUMADIN) 2.5 MG tablet Take by mouth.        Allergies:    Allergies  Allergen Reactions  . Benadryl [Diphenhydramine Hcl (Sleep)] Other (See Comments)    "Makes me wild, edgy acting"  . Hand Sanitizer [Ethyl Alcohol (Skin Cleanser)] Swelling    Severe swelling  . Soap   . Adhesive [Tape] Rash  . Penicillins Hives, Swelling and Rash    Did it involve swelling of the face/tongue/throat, SOB, or low BP? Did it involve sudden or severe rash/hives, skin peeling, or any reaction on the inside of your mouth or nose?  Did you need to seek  medical attention at a hospital or doctor's office?  When did it last happen? If all above answers are "NO", may proceed with cephalosporin use..  . Streptomycin Sulfate [Streptomycin] Hives, Swelling and Rash    Social History:   The patient  reports that she has never smoked. She has never used smokeless tobacco. She reports that she does not drink alcohol or use drugs.    Family History:   The patient's family history includes CAD in her father; CVA in her father; Heart disease in her father.   ROS:  Please see the history of present illness.  All other ROS reviewed and negative.     Vital Signs: Blood pressure (!) 119/59, pulse 75, temperature 98.2 F (36.8 C), temperature source Oral, resp. rate 18, height 5' (1.524 m), weight 70.5 kg, SpO2 97 %.   PHYSICAL EXAM: General:  Well nourished, well developed, in no acute distress HEENT: normal Lymph: no adenopathy Neck: no JVD Endocrine:  No thryomegaly Vascular: No carotid bruits; DP pulses 1+ bilaterally  Cardiac:  normal S1, S2; irreg irreg; no murmur  Lungs:  clear to auscultation bilaterally, no wheezing, rhonchi or rales  Abd: soft, nontender, no hepatomegaly  Ext: no edema Musculoskeletal:  No deformities, BUE and BLE strength normal and equal Skin: warm and dry  Neuro:  CNs 2-12 intact, no focal abnormalities noted Psych:  Normal affect   EKG:  OSH EKG shows afib w/ HR 84  Labs: No results for input(s): CKTOTAL, CKMB, TROPONINI in the last 72 hours. No results for input(s): TROPIPOC in the last 72 hours.  Lab Results  Component Value Date   WBC 5.0 04/20/2017   HGB 11.5 (L) 04/20/2017   HCT 35.4 04/20/2017   MCV 87.8 04/20/2017   PLT 140 04/20/2017   No results for input(s): NA, K, CL, CO2, BUN, CREATININE, CALCIUM, PROT, BILITOT, ALKPHOS, ALT, AST, GLUCOSE in the last 168 hours.  Invalid input(s): LABALBU No results found for: CHOL, HDL, LDLCALC, TRIG No results found for: DDIMER  OSH labs 07-24-19 K  4.0, Cr 1.18 Trop 0.28 Pro BNP 6726  Radiology/Studies:  Dg Chest Port 1 View  Result Date: 07/24/2019 CLINICAL DATA:  Shortness of breath EXAM: PORTABLE CHEST 1 VIEW COMPARISON:  07/24/2019, 07/01/2019 FINDINGS: Diffuse bilateral interstitial and ground-glass opacity with more confluent airspace disease at the right base and left lower lung. Aeration on the right appears slightly improved. The heart is enlarged. Aortic atherosclerosis. No pneumothorax. IMPRESSION: 1. Slight improvement in aeration on the right. Residual fine interstitial and ground-glass opacity, presumably due to infection with more confluent airspace disease at the right base and left mid  to lower lung. 2. Cardiomegaly Electronically Signed   By: Jasmine Pang M.D.   On: 07/24/2019 21:20   06-11-16 Nuc stress test  Accentuation in resting ST segment depression noted in the inferolateral leads. Atrial fibrillation present throughout.  Small, mild intensity, apical anterior defect that is partially reversible. This could be related to variable breast attenuation artifact, although a minor region of ischemia is also possible.  This is a high risk study based on reduced LVEF, no large ischemic territories identified however.  Nuclear stress EF: 30%.  TTE 06-08-19 1. The left ventricle has mildly reduced systolic function, with an ejection fraction of 45-50%. The cavity size was normal. Left ventricular diastolic function could not be evaluated secondary to atrial fibrillation. Elevated left atrial and left  ventricular end-diastolic pressures Left ventricular diffuse hypokinesis.  2. Left atrial size was severely dilated.  3. The mitral valve is degenerative. Mild thickening of the mitral valve leaflet. There is moderate mitral annular calcification present. Mitral valve regurgitation is moderate to severe by color flow Doppler.  4. The tricuspid valve is grossly normal.  5. The aortic valve is tricuspid. Mild thickening of the  aortic valve.  6. The aorta is normal in size and structure.  7. The inferior vena cava was dilated in size with >50% respiratory variability.  8. There is right bowing of the interatrial septum, suggestive of elevated left atrial pressure.   ASSESSMENT AND PLAN:  1. Abnormal trop @ OSH: pt is currently asymptomatic. No prior h/o CAD. Her HR is well-controlled at the moment. Would await labs here at [facility] and see what trops do overnight. If the trop continues to rise, could consider LHC. If trop is flat or dropping, nuc stress test or even more conservative medical management might be appropriate given overall lack of sx. Recent TTE showed EF 45-50%; it may be worth repeating to make sure EF has not dropped over the past 2 months.  2. MR: noted to be moderate to severe on recent TTE. Would repeat TTE as above not only to reassess LVEF but also the degree of MR. If MR is truly severe, further valve evaluation/intervention may be warranted.  3. Afib: rate-controlled. Cont pre-hospital regimen  4. DM2: mgmt as per primary hospital med team  Thank you for the opportunity to participate in the care of this patient.  For questions or updates, please contact [facility] Please consult [email or web address] for contact info under   Signed, Precious Reel, MD, [facility] 07/24/2019 10:20 PM

## 2019-07-24 NOTE — Progress Notes (Signed)
Paged [facility] admission, they will notify MD and he will come and see the patient. Primary nurse aware.

## 2019-07-25 ENCOUNTER — Inpatient Hospital Stay (HOSPITAL_COMMUNITY): Payer: Medicare Other

## 2019-07-25 ENCOUNTER — Encounter (HOSPITAL_COMMUNITY): Payer: Self-pay | Admitting: Internal Medicine

## 2019-07-25 ENCOUNTER — Other Ambulatory Visit: Payer: Self-pay

## 2019-07-25 DIAGNOSIS — Z86718 Personal history of other venous thrombosis and embolism: Secondary | ICD-10-CM | POA: Diagnosis not present

## 2019-07-25 DIAGNOSIS — Z86711 Personal history of pulmonary embolism: Secondary | ICD-10-CM | POA: Diagnosis not present

## 2019-07-25 DIAGNOSIS — Z91048 Other nonmedicinal substance allergy status: Secondary | ICD-10-CM | POA: Diagnosis not present

## 2019-07-25 DIAGNOSIS — I10 Essential (primary) hypertension: Secondary | ICD-10-CM | POA: Diagnosis not present

## 2019-07-25 DIAGNOSIS — I340 Nonrheumatic mitral (valve) insufficiency: Secondary | ICD-10-CM

## 2019-07-25 DIAGNOSIS — Z7901 Long term (current) use of anticoagulants: Secondary | ICD-10-CM | POA: Diagnosis not present

## 2019-07-25 DIAGNOSIS — I214 Non-ST elevation (NSTEMI) myocardial infarction: Secondary | ICD-10-CM | POA: Diagnosis present

## 2019-07-25 DIAGNOSIS — R1110 Vomiting, unspecified: Secondary | ICD-10-CM | POA: Diagnosis present

## 2019-07-25 DIAGNOSIS — Z880 Allergy status to penicillin: Secondary | ICD-10-CM | POA: Diagnosis not present

## 2019-07-25 DIAGNOSIS — R0600 Dyspnea, unspecified: Secondary | ICD-10-CM | POA: Diagnosis present

## 2019-07-25 DIAGNOSIS — J441 Chronic obstructive pulmonary disease with (acute) exacerbation: Secondary | ICD-10-CM | POA: Diagnosis not present

## 2019-07-25 DIAGNOSIS — E1165 Type 2 diabetes mellitus with hyperglycemia: Secondary | ICD-10-CM | POA: Diagnosis not present

## 2019-07-25 DIAGNOSIS — J9611 Chronic respiratory failure with hypoxia: Secondary | ICD-10-CM | POA: Diagnosis not present

## 2019-07-25 DIAGNOSIS — R2689 Other abnormalities of gait and mobility: Secondary | ICD-10-CM | POA: Diagnosis not present

## 2019-07-25 DIAGNOSIS — Z48812 Encounter for surgical aftercare following surgery on the circulatory system: Secondary | ICD-10-CM | POA: Diagnosis not present

## 2019-07-25 DIAGNOSIS — R0602 Shortness of breath: Secondary | ICD-10-CM | POA: Diagnosis not present

## 2019-07-25 DIAGNOSIS — E785 Hyperlipidemia, unspecified: Secondary | ICD-10-CM | POA: Diagnosis present

## 2019-07-25 DIAGNOSIS — Z8679 Personal history of other diseases of the circulatory system: Secondary | ICD-10-CM

## 2019-07-25 DIAGNOSIS — R7989 Other specified abnormal findings of blood chemistry: Secondary | ICD-10-CM | POA: Diagnosis present

## 2019-07-25 DIAGNOSIS — R41841 Cognitive communication deficit: Secondary | ICD-10-CM | POA: Diagnosis not present

## 2019-07-25 DIAGNOSIS — Z23 Encounter for immunization: Secondary | ICD-10-CM | POA: Diagnosis not present

## 2019-07-25 DIAGNOSIS — N183 Chronic kidney disease, stage 3 unspecified: Secondary | ICD-10-CM | POA: Diagnosis not present

## 2019-07-25 DIAGNOSIS — Z7401 Bed confinement status: Secondary | ICD-10-CM | POA: Diagnosis not present

## 2019-07-25 DIAGNOSIS — Z7952 Long term (current) use of systemic steroids: Secondary | ICD-10-CM | POA: Diagnosis not present

## 2019-07-25 DIAGNOSIS — Z881 Allergy status to other antibiotic agents status: Secondary | ICD-10-CM | POA: Diagnosis not present

## 2019-07-25 DIAGNOSIS — Z888 Allergy status to other drugs, medicaments and biological substances status: Secondary | ICD-10-CM | POA: Diagnosis not present

## 2019-07-25 DIAGNOSIS — D6859 Other primary thrombophilia: Secondary | ICD-10-CM | POA: Diagnosis present

## 2019-07-25 DIAGNOSIS — Z66 Do not resuscitate: Secondary | ICD-10-CM | POA: Diagnosis present

## 2019-07-25 DIAGNOSIS — M6281 Muscle weakness (generalized): Secondary | ICD-10-CM | POA: Diagnosis not present

## 2019-07-25 DIAGNOSIS — D649 Anemia, unspecified: Secondary | ICD-10-CM | POA: Diagnosis present

## 2019-07-25 DIAGNOSIS — I5023 Acute on chronic systolic (congestive) heart failure: Secondary | ICD-10-CM | POA: Diagnosis present

## 2019-07-25 DIAGNOSIS — M353 Polymyalgia rheumatica: Secondary | ICD-10-CM | POA: Diagnosis present

## 2019-07-25 DIAGNOSIS — R5381 Other malaise: Secondary | ICD-10-CM | POA: Diagnosis not present

## 2019-07-25 DIAGNOSIS — Z20828 Contact with and (suspected) exposure to other viral communicable diseases: Secondary | ICD-10-CM | POA: Diagnosis present

## 2019-07-25 DIAGNOSIS — R079 Chest pain, unspecified: Secondary | ICD-10-CM | POA: Diagnosis not present

## 2019-07-25 DIAGNOSIS — R778 Other specified abnormalities of plasma proteins: Secondary | ICD-10-CM | POA: Diagnosis present

## 2019-07-25 DIAGNOSIS — M2550 Pain in unspecified joint: Secondary | ICD-10-CM | POA: Diagnosis not present

## 2019-07-25 DIAGNOSIS — I420 Dilated cardiomyopathy: Secondary | ICD-10-CM | POA: Diagnosis not present

## 2019-07-25 DIAGNOSIS — I4821 Permanent atrial fibrillation: Secondary | ICD-10-CM | POA: Diagnosis present

## 2019-07-25 DIAGNOSIS — I130 Hypertensive heart and chronic kidney disease with heart failure and stage 1 through stage 4 chronic kidney disease, or unspecified chronic kidney disease: Secondary | ICD-10-CM | POA: Diagnosis present

## 2019-07-25 DIAGNOSIS — J189 Pneumonia, unspecified organism: Secondary | ICD-10-CM | POA: Diagnosis present

## 2019-07-25 DIAGNOSIS — I2510 Atherosclerotic heart disease of native coronary artery without angina pectoris: Secondary | ICD-10-CM | POA: Diagnosis present

## 2019-07-25 LAB — BASIC METABOLIC PANEL
Anion gap: 10 (ref 5–15)
BUN: 27 mg/dL — ABNORMAL HIGH (ref 8–23)
CO2: 22 mmol/L (ref 22–32)
Calcium: 8.7 mg/dL — ABNORMAL LOW (ref 8.9–10.3)
Chloride: 103 mmol/L (ref 98–111)
Creatinine, Ser: 1 mg/dL (ref 0.44–1.00)
GFR calc Af Amer: 59 mL/min — ABNORMAL LOW (ref >60)
GFR calc non Af Amer: 51 mL/min — ABNORMAL LOW (ref >60)
Glucose, Bld: 98 mg/dL (ref 70–99)
Potassium: 4 mmol/L (ref 3.5–5.1)
Sodium: 135 mmol/L (ref 135–145)

## 2019-07-25 LAB — CBC
HCT: 27.4 % — ABNORMAL LOW (ref 36.0–46.0)
Hemoglobin: 8.8 g/dL — ABNORMAL LOW (ref 12.0–15.0)
MCH: 27.9 pg (ref 26.0–34.0)
MCHC: 32.1 g/dL (ref 30.0–36.0)
MCV: 87 fL (ref 80.0–100.0)
Platelets: 141 K/uL — ABNORMAL LOW (ref 150–400)
RBC: 3.15 MIL/uL — ABNORMAL LOW (ref 3.87–5.11)
RDW: 15.8 % — ABNORMAL HIGH (ref 11.5–15.5)
WBC: 6.3 K/uL (ref 4.0–10.5)
nRBC: 0 % (ref 0.0–0.2)

## 2019-07-25 LAB — PROTIME-INR
INR: 2.9 — ABNORMAL HIGH (ref 0.8–1.2)
Prothrombin Time: 30.1 s — ABNORMAL HIGH (ref 11.4–15.2)

## 2019-07-25 LAB — GLUCOSE, CAPILLARY
Glucose-Capillary: 112 mg/dL — ABNORMAL HIGH (ref 70–99)
Glucose-Capillary: 200 mg/dL — ABNORMAL HIGH (ref 70–99)
Glucose-Capillary: 175 mg/dL — ABNORMAL HIGH (ref 70–99)
Glucose-Capillary: 198 mg/dL — ABNORMAL HIGH (ref 70–99)
Glucose-Capillary: 169 mg/dL — ABNORMAL HIGH (ref 70–99)

## 2019-07-25 LAB — ECHOCARDIOGRAM COMPLETE
Height: 60 in
Weight: 2493.84 oz

## 2019-07-25 LAB — PROCALCITONIN: Procalcitonin: 2.94 ng/mL

## 2019-07-25 LAB — TROPONIN I (HIGH SENSITIVITY)
Troponin I (High Sensitivity): 916 ng/L (ref <18)
Troponin I (High Sensitivity): 884 ng/L (ref <18)

## 2019-07-25 MED ORDER — POTASSIUM CHLORIDE CRYS ER 20 MEQ PO TBCR
20.0000 meq | EXTENDED_RELEASE_TABLET | Freq: Every day | ORAL | Status: DC
  Administered 2019-07-25 – 2019-07-27 (×3): 20 meq via ORAL
  Filled 2019-07-25 (×3): qty 1

## 2019-07-25 MED ORDER — ACETAMINOPHEN 325 MG PO TABS
650.0000 mg | ORAL_TABLET | Freq: Four times a day (QID) | ORAL | Status: DC | PRN
  Administered 2019-07-26: 650 mg via ORAL
  Filled 2019-07-25: qty 2

## 2019-07-25 MED ORDER — ACETAMINOPHEN 650 MG RE SUPP: 650.0000 mg | Freq: Four times a day (QID) | RECTAL | Status: DC | PRN

## 2019-07-25 MED ORDER — ROSUVASTATIN CALCIUM 5 MG PO TABS: 5.0000 mg | ORAL_TABLET | Freq: Every day | ORAL | Status: DC

## 2019-07-25 MED ORDER — INSULIN ASPART 100 UNIT/ML ~~LOC~~ SOLN
0.0000 [IU] | Freq: Three times a day (TID) | SUBCUTANEOUS | Status: DC
  Administered 2019-07-25 – 2019-07-26 (×4): 2 [IU] via SUBCUTANEOUS
  Administered 2019-07-26: 3 [IU] via SUBCUTANEOUS
  Administered 2019-07-26 – 2019-07-27 (×3): 2 [IU] via SUBCUTANEOUS
  Administered 2019-07-27: 3 [IU] via SUBCUTANEOUS
  Administered 2019-07-28 – 2019-07-29 (×3): 2 [IU] via SUBCUTANEOUS
  Administered 2019-07-29: 1 [IU] via SUBCUTANEOUS

## 2019-07-25 MED ORDER — DOXYCYCLINE HYCLATE 100 MG PO TABS
100.0000 mg | ORAL_TABLET | Freq: Two times a day (BID) | ORAL | Status: DC
  Administered 2019-07-25 – 2019-07-29 (×9): 100 mg via ORAL
  Filled 2019-07-25 (×9): qty 1

## 2019-07-25 MED ORDER — PREDNISONE 5 MG PO TABS
5.0000 mg | ORAL_TABLET | Freq: Two times a day (BID) | ORAL | Status: DC
  Administered 2019-07-25 – 2019-07-29 (×9): 5 mg via ORAL
  Filled 2019-07-25 (×9): qty 1

## 2019-07-25 MED ORDER — AZITHROMYCIN 250 MG PO TABS
250.0000 mg | ORAL_TABLET | Freq: Every day | ORAL | Status: DC
  Administered 2019-07-26 – 2019-07-27 (×2): 250 mg via ORAL
  Filled 2019-07-25 (×2): qty 1

## 2019-07-25 MED ORDER — ONDANSETRON HCL 4 MG/2ML IJ SOLN: 4.0000 mg | Freq: Four times a day (QID) | INTRAMUSCULAR | Status: DC | PRN

## 2019-07-25 MED ORDER — ASPIRIN EC 81 MG PO TBEC
324.0000 mg | DELAYED_RELEASE_TABLET | Freq: Once | ORAL | Status: DC
  Filled 2019-07-25: qty 4

## 2019-07-25 MED ORDER — ONDANSETRON HCL 4 MG PO TABS: 4.0000 mg | ORAL_TABLET | Freq: Four times a day (QID) | ORAL | Status: DC | PRN

## 2019-07-25 MED ORDER — INFLUENZA VAC A&B SA ADJ QUAD 0.5 ML IM PRSY
0.5000 mL | PREFILLED_SYRINGE | INTRAMUSCULAR | Status: AC
  Administered 2019-07-26: 0.5 mL via INTRAMUSCULAR
  Filled 2019-07-25: qty 0.5

## 2019-07-25 MED ORDER — AZITHROMYCIN 500 MG PO TABS
500.0000 mg | ORAL_TABLET | Freq: Once | ORAL | Status: AC
  Administered 2019-07-25: 500 mg via ORAL
  Filled 2019-07-25: qty 1

## 2019-07-25 MED ORDER — SACUBITRIL-VALSARTAN 24-26 MG PO TABS
1.0000 | ORAL_TABLET | Freq: Two times a day (BID) | ORAL | Status: DC
  Administered 2019-07-25 – 2019-07-29 (×9): 1 via ORAL
  Filled 2019-07-25 (×10): qty 1

## 2019-07-25 MED ORDER — ATORVASTATIN CALCIUM 10 MG PO TABS
10.0000 mg | ORAL_TABLET | Freq: Every evening | ORAL | Status: DC
  Administered 2019-07-25 – 2019-07-28 (×4): 10 mg via ORAL
  Filled 2019-07-25 (×4): qty 1

## 2019-07-25 MED ORDER — FUROSEMIDE 40 MG PO TABS
40.0000 mg | ORAL_TABLET | Freq: Every day | ORAL | Status: DC
  Administered 2019-07-25 – 2019-07-26 (×2): 40 mg via ORAL
  Filled 2019-07-25 (×2): qty 1

## 2019-07-25 MED ORDER — DOCUSATE SODIUM 100 MG PO CAPS: 100.0000 mg | ORAL_CAPSULE | Freq: Two times a day (BID) | ORAL | Status: DC | PRN

## 2019-07-25 MED ORDER — MAGNESIUM SULFATE 2 GM/50ML IV SOLN
2.0000 g | Freq: Once | INTRAVENOUS | Status: AC
  Administered 2019-07-25: 2 g via INTRAVENOUS
  Filled 2019-07-25: qty 50

## 2019-07-25 MED ORDER — CARVEDILOL 12.5 MG PO TABS
12.5000 mg | ORAL_TABLET | Freq: Two times a day (BID) | ORAL | Status: DC
  Administered 2019-07-25 – 2019-07-29 (×8): 12.5 mg via ORAL
  Filled 2019-07-25 (×9): qty 1

## 2019-07-25 NOTE — Progress Notes (Signed)
  Echocardiogram 2D Echocardiogram has been performed.  Rebecca Hanson 07/25/2019, 12:18 PM

## 2019-07-25 NOTE — NC FL2 (Signed)
[place] LEVEL OF CARE SCREENING TOOL     IDENTIFICATION  Patient Name: Rebecca Hanson Birthdate: [birth year] Sex: female Admission Date (Current Location): 07/24/2019  [facility] and [place] Number:  Herbalist and Address:  [facility], [place] [address], [place], [place] [address]      Provider Number: [id number]  Attending Physician Name and Address:  Geradine Girt, DO  Relative Name and Phone Number:  Dorthula Rue [phone]    Current Level of Care: Hospital Recommended Level of Care: [place] Prior Approval Number:    Date Approved/Denied:   PASRR Number: [id number]  Discharge Plan: SNF    Current Diagnoses: Patient Active Problem List   Diagnosis Date Noted  . Elevated troponin 07/25/2019  . Chest pain 07/25/2019  . Dyspnea 07/25/2019  . SOB (shortness of breath)   . DCM (dilated cardiomyopathy) ([place])   . Nonrheumatic mitral valve regurgitation   . Polymyalgia rheumatica ([place]) 11/19/2016  . Primary osteoarthritis of both knees 11/19/2016  . Primary osteoarthritis of both hands 11/19/2016  . Pseudogout 11/19/2016  . Essential hypertension 11/19/2016  . Diabetes mellitus 11/19/2016  . History of DVT (deep vein thrombosis) 11/19/2016  . History of pulmonary embolism 11/19/2016  . History of congestive heart failure 11/19/2016    Orientation RESPIRATION BLADDER Height & Weight     Self, Time, Situation, Place  O2(2 liters [place]) Incontinent(wears briefs,) Weight: 70.7 kg Height:  5' (152.4 cm)  BEHAVIORAL SYMPTOMS/MOOD NEUROLOGICAL BOWEL NUTRITION STATUS      Incontinent(wears briefs) Diet(Healthy Heart diet, fluid consistency thin)  AMBULATORY STATUS COMMUNICATION OF NEEDS Skin   Limited Assist(steady) Verbally Normal                       Personal Care Assistance Level of Assistance  Bathing, Feeding, Dressing Bathing Assistance: Limited assistance Feeding assistance:  Independent Dressing Assistance: Limited assistance     Functional Limitations Info  Sight, Hearing, Speech Sight Info: Adequate Hearing Info: Adequate Speech Info: Adequate    SPECIAL CARE FACTORS FREQUENCY  PT (By licensed PT), OT (By licensed OT)     PT Frequency: 5x/week OT Frequency: 5x/week            Contractures Contractures Info: Not present    Additional Factors Info  Code Status, Allergies Code Status Info: Full Allergies Info: Benadryl Diphenhydramine Hcl Sleep, Hand Sanitizer Ethyl Alcohol Skin Cleanser, Adhesive Tape, Penicillins, Soap, Streptomycin SulfateStreptomycin           Current Medications (07/25/2019):  This is the current hospital active medication list Current Facility-Administered Medications  Medication Dose Route Frequency Provider Last Rate Last Dose  . acetaminophen (TYLENOL) tablet 650 mg  650 mg Oral Q6H PRN Rise Patience, MD       Or  . acetaminophen (TYLENOL) suppository 650 mg  650 mg Rectal Q6H PRN Rise Patience, MD      . aspirin EC tablet 324 mg  324 mg Oral Once Rise Patience, MD      . atorvastatin (LIPITOR) tablet 10 mg  10 mg Oral QPM Rise Patience, MD      . Derrill Memo ON 07/26/2019] azithromycin (ZITHROMAX) tablet 250 mg  250 mg Oral Daily Vann, Jessica U, DO      . carvedilol (COREG) tablet 12.5 mg  12.5 mg Oral BID WC Rise Patience, MD   12.5 mg at 07/25/19 0841  . docusate sodium (COLACE)  capsule 100 mg  100 mg Oral BID PRN Eduard Clos, MD      . doxycycline (VIBRA-TABS) tablet 100 mg  100 mg Oral Q12H Vann, Jessica U, DO   100 mg at 07/25/19 0953  . furosemide (LASIX) tablet 40 mg  40 mg Oral Daily Eduard Clos, MD   40 mg at 07/25/19 0842  . [START ON 07/26/2019] influenza vaccine adjuvanted (FLUAD) injection 0.5 mL  0.5 mL Intramuscular Tomorrow-1000 Vann, Jessica U, DO      . insulin aspart (novoLOG) injection 0-9 Units  0-9 Units Subcutaneous TID WC Eduard Clos, MD    2 Units at 07/25/19 1245  . ondansetron (ZOFRAN) tablet 4 mg  4 mg Oral Q6H PRN Eduard Clos, MD       Or  . ondansetron [facility]) injection 4 mg  4 mg Intravenous Q6H PRN Eduard Clos, MD      . potassium chloride SA (K-DUR) CR tablet 20 mEq  20 mEq Oral Daily Eduard Clos, MD   20 mEq at 07/25/19 0842  . predniSONE (DELTASONE) tablet 5 mg  5 mg Oral BID WC Eduard Clos, MD   5 mg at 07/25/19 0841  . sacubitril-valsartan (ENTRESTO) 24-26 mg per tablet  1 tablet Oral BID Eduard Clos, MD   1 tablet at 07/25/19 2751     Discharge Medications: Please see discharge summary for a list of discharge medications.  Relevant Imaging Results:  Relevant Lab Results:   Additional Information soc sec [phone]  Leone Haven, RN

## 2019-07-25 NOTE — Progress Notes (Signed)
Progress Note  Patient Name: Rebecca Hanson Date of Encounter: 07/25/2019  Primary Cardiologist: Kate Sable, MD   Subjective   Patient denies chest pain. Currently on 2 L O2. At baseline patient says she is not on oxygen.   Inpatient Medications    Scheduled Meds: . aspirin EC  324 mg Oral Once  . atorvastatin  10 mg Oral QPM  . carvedilol  12.5 mg Oral BID WC  . furosemide  40 mg Oral Daily  . insulin aspart  0-9 Units Subcutaneous TID WC  . potassium chloride SA  20 mEq Oral Daily  . predniSONE  5 mg Oral BID WC  . sacubitril-valsartan  1 tablet Oral BID  . Warfarin - Pharmacist Dosing Inpatient   Does not apply q1800   Continuous Infusions: . magnesium sulfate bolus IVPB     PRN Meds: acetaminophen **OR** acetaminophen, docusate sodium, ondansetron **OR** ondansetron (ZOFRAN) IV   Vital Signs    Vitals:   07/24/19 1900 07/24/19 1902 07/24/19 2339 07/25/19 0426  BP:  (!) 119/59 (!) 116/58 120/64  Pulse:  75 79 89  Resp:  18 17 16   Temp:  98.2 F (36.8 C) (!) 97.3 F (36.3 C) 98.4 F (36.9 C)  TempSrc:  Oral Oral Oral  SpO2:  97% 94% 96%  Weight: 70.5 kg   70.7 kg  Height: 5' (1.524 m)       Intake/Output Summary (Last 24 hours) at 07/25/2019 0834 Last data filed at 07/25/2019 0622 Gross per 24 hour  Intake 240 ml  Output 500 ml  Net -260 ml   Last 3 Weights 07/25/2019 07/24/2019 06/06/2019  Weight (lbs) 155 lb 13.8 oz 155 lb 6.8 oz 171 lb 3.2 oz  Weight (kg) 70.7 kg 70.5 kg 77.656 kg      Telemetry    Atrial fibrillation, HR 70-80s - Personally Reviewed  ECG    Afib, 82 bpm, nonspecific Twave changes inferior leads, LAD- Personally Reviewed  Physical Exam   GEN: No acute distress.   Neck: Moderate JVD Cardiac: Irreg Irreg, systolic murmurs, rubs, or gallops.  Respiratory: Crackles right side; 2L O2 GI: Soft, nontender, non-distended  MS: Trace edema; No deformity. Neuro:  Nonfocal  Psych: Normal affect   Labs    High  Sensitivity Troponin:   Recent Labs  Lab 07/24/19 2200  TROPONINIHS 1,310*      Chemistry Recent Labs  Lab 07/24/19 2200 07/25/19 0607  NA 135 135  K 3.9 4.0  CL 101 103  CO2 24 22  GLUCOSE 220* 98  BUN 25* 27*  CREATININE 1.25* 1.00  CALCIUM 9.1 8.7*  PROT 5.4*  --   ALBUMIN 2.7*  --   AST 26  --   ALT 12  --   ALKPHOS 49  --   BILITOT 0.6  --   GFRNONAA 39* 51*  GFRAA 45* 59*  ANIONGAP 10 10     Hematology Recent Labs  Lab 07/24/19 2200 07/25/19 0607  WBC 6.0 6.3  RBC 3.21* 3.15*  HGB 9.0* 8.8*  HCT 28.0* 27.4*  MCV 87.2 87.0  MCH 28.0 27.9  MCHC 32.1 32.1  RDW 15.7* 15.8*  PLT 142* 141*    BNP Recent Labs  Lab 07/24/19 2200  BNP 459.9*     DDimer No results for input(s): DDIMER in the last 168 hours.   Radiology    Dg Chest Port 1 View  Result Date: 07/24/2019 CLINICAL DATA:  Shortness of breath EXAM: PORTABLE CHEST 1  VIEW COMPARISON:  07/24/2019, 07/01/2019 FINDINGS: Diffuse bilateral interstitial and ground-glass opacity with more confluent airspace disease at the right base and left lower lung. Aeration on the right appears slightly improved. The heart is enlarged. Aortic atherosclerosis. No pneumothorax. IMPRESSION: 1. Slight improvement in aeration on the right. Residual fine interstitial and ground-glass opacity, presumably due to infection with more confluent airspace disease at the right base and left mid to lower lung. 2. Cardiomegaly Electronically Signed   By: Jasmine Pang M.D.   On: 07/24/2019 21:20    Cardiac Studies   2D Echo pending  06-11-16 Nuc stress test  Accentuation in resting ST segment depression noted in the inferolateral leads. Atrial fibrillation present throughout.  Small, mild intensity, apical anterior defect that is partially reversible. This could be related to variable breast attenuation artifact, although a minor region of ischemia is also possible.  This is a high risk study based on reduced LVEF, no large  ischemic territories identified however.  Nuclear stress EF: 30%.  TTE 06-08-19 1. The left ventricle has mildly reduced systolic function, with an ejection fraction of 45-50%. The cavity size was normal. Left ventricular diastolic function could not be evaluated secondary to atrial fibrillation. Elevated left atrial and left  ventricular end-diastolic pressures Left ventricular diffuse hypokinesis. 2. Left atrial size was severely dilated. 3. The mitral valve is degenerative. Mild thickening of the mitral valve leaflet. There is moderate mitral annular calcification present. Mitral valve regurgitation is moderate to severe by color flow Doppler. 4. The tricuspid valve is grossly normal. 5. The aortic valve is tricuspid. Mild thickening of the aortic valve. 6. The aorta is normal in size and structure. 7. The inferior vena cava was dilated in size with >50% respiratory variability. 8. There is right bowing of the interatrial septum, suggestive of elevated left atrial pressure.   Patient Profile     85 y.o. female w/ h/o afib (chronic), chronic systolic HF with EF 30-35%, mild MR/TR, h/o PE and protein S deficiency, HTN, DM2, but no prior h/o CAD, who is being seen today for the evaluation of elevated/abnormal troponin.   Assessment & Plan    Elevated Troponin/ ?NSTEMI vs demand ischemia in setting of PNA and anemia - Patient was transferred from [facility] ER for elevated troponin. troponin 0.05 > 0.14 > 0.28 - Initial HSTroponin 1310  - Patient has no history of CAD - So far patient has been symptom free with no CP. Patient had dyspnea at the ER in [facility] but improved with IV Lasix - EF 2 months ago was 45-50% - Echo results pending.  - With further elevation of Troponin can consider LHC.  - Will hold coumadin in anticipation of heart cath for later in the week. IV heparin per pharmacy when INR <2. INR today 2.9  MR - noted to be moderate to severe on recent TEE - Echo  results pending - SOB so far improved, but still on 2L O2  Acute on chronic Anemia - Hgb in 2018 was 11. This admission was 8.8 - Patient has not reported any bleeding - Will check a hemoccult - Recommend further evaluation per IM  Acute on Chronic systolic Heart Failure  - Patient initially presented to [facility] ER for dyspnea. She received IV lasix in the ER which seemed to improve SOB. Patient is on Lasix 40 mg daily at home.  - BNP 459 on admission - Patient is on 2L O2. She denies home O2 at baseline.  - Trace  edema on exam - Continue Coreg 12.5 mg daily, and Entresto - Continue Home lasix - Creatinine improved 1.25 > 1.00 - Monitor weight, I&Os  Permanent Afib - Rate controlled on coreg - A/C warfarin. INR today 2.9. Will change to IV heparin per pharmacy when INR less than 2  Hyperlipidemia - Atorvastatin 10 mg home dose - No LDL on record. Will check AM labs  Hypertension - Coreg home med - Stable  CKD stage III - Creatinine improved 1.25 > 1.00 - Avoid nephrotoxic agents  H/o DVT and PE and protein C deficiency - A/C Coumadin >> transition to IV heparin in anticipation of cath  PNA - Per recent CXR - abx per IM  DM2 - per IM   For questions or updates, please contact [facility] Please consult [email or web address] for contact info under        Signed, Laketha Leopard David Stall, PA-C  07/25/2019, 8:34 AM

## 2019-07-25 NOTE — H&P (Addendum)
History and Physical    Rebecca Hanson DDU:[id number] DOB: [birth year] DOA: 07/24/2019  PCP: Ignatius Specking, MD  Patient coming from: Patient was transferred from [facility]  Chief Complaint: Elevated troponin.  HPI: Rebecca Hanson is a 85 y.o. female with history of chronic systolic heart failure last EF measured in July 2020 was 45 to 50%, atrial fibrillation, history of protein C deficiency with pulmonary embolism on Coumadin, mitral regurgitation, diabetes mellitus, chronic kidney disease stage III, anemia was brought to the ER at [facility] with complaints of having shortness of breath was given 1 dose of Lasix and discharged back to skilled nursing facility.  Since patient had mildly elevated troponin patient was sent back to the ER for further work-up.  With the second visit patient was asymptomatic.  But given that patient's troponin was mildly increasing from 0.14 - 0.28 patient was transferred to [facility] since patient's cardiologist is at [facility].  Over ER patient is asymptomatic denies any chest pain or shortness of breath no abdominal pain nausea vomiting or diarrhea.  On exam patient's legs are not swollen chest x-ray shows better aeration from previous x-ray done earlier today.  COVID-19 test is negative.  Labs show creatinine 1.2 hemoglobin 9 BNP 459 high-sensitivity troponin was 1300.  Patient's magnesium was 1.2.  Platelets was 142.  Discussed with on-call cardiologist who at this time is planning to have a repeat 2D echo done given the CHF findings earlier to make sure patient's mild regurgitation is not getting worse.  ED Course: Patient was a direct admit.  Review of Systems: As per HPI, rest all negative.   Past Medical History:  Diagnosis Date  . CHF (congestive heart failure) (HCC)   . Diabetes mellitus without complication (HCC)   . Dyslipidemia     Past Surgical History:  Procedure Laterality Date  . BREAST BIOPSY    . CATARACT EXTRACTION        reports that she has never smoked. She has never used smokeless tobacco. She reports that she does not drink alcohol or use drugs.  Allergies  Allergen Reactions  . Benadryl [Diphenhydramine Hcl (Sleep)] Other (See Comments)    "Makes me wild, edgy acting"  . Hand Sanitizer [Ethyl Alcohol (Skin Cleanser)] Swelling    Severe swelling  . Adhesive [Tape] Rash  . Penicillins Hives, Swelling and Rash    Did it involve swelling of the face/tongue/throat, SOB, or low BP? Did it involve sudden or severe rash/hives, skin peeling, or any reaction on the inside of your mouth or nose?  Did you need to seek medical attention at a hospital or doctor's office?  When did it last happen? If all above answers are "NO", may proceed with cephalosporin use..  . Soap Rash  . Streptomycin Sulfate [Streptomycin] Hives, Swelling and Rash    Family History  Problem Relation Age of Onset  . Heart disease Father   . CAD Father   . CVA Father     Prior to Admission medications   Medication Sig Start Date End Date Taking? Authorizing Provider  acetaminophen (TYLENOL) 325 MG tablet Take 650 mg by mouth every 4 (four) hours as needed (for pain).   Yes [provider]  aspirin EC 81 MG tablet Take 324 mg by mouth once.   Yes [provider]  atorvastatin (LIPITOR) 10 MG tablet Take 10 mg by mouth every evening.   Yes [provider]  Calcium Citrate-Vitamin D (CALCIUM + D PO)  Take 1 tablet by mouth 2 (two) times daily.   Yes [provider]  carvedilol (COREG) 12.5 MG tablet Take 12.5 mg by mouth 2 (two) times daily.  02/05/15  Yes [provider]  cetirizine (ALL DAY ALLERGY) 10 MG tablet Take 10 mg by mouth daily.   Yes [provider]  docusate sodium (COLACE) 100 MG capsule Take 1 capsule by mouth 2 (two) times daily as needed for mild constipation.    Yes [provider]  furosemide (LASIX) 40 MG tablet Take 40 mg by mouth daily.    Yes  [provider]  glimepiride (AMARYL) 1 MG tablet Take 1 mg by mouth daily with breakfast.   Yes [provider]  metFORMIN (GLUCOPHAGE) 500 MG tablet Take 500 mg by mouth 2 (two) times daily.  04/28/15  Yes [provider]  Multiple Vitamin (DAILY-VITE) TABS Take 1 tablet by mouth daily.   Yes [provider]  potassium chloride (K-DUR) 20 MEQ tablet Take 1 tablet (20 mEq total) by mouth daily. 06/06/19  Yes Laqueta LindenKoneswaran, Suresh A, MD  predniSONE (DELTASONE) 5 MG tablet Take 5 mg by mouth 2 (two) times daily with a meal. 07/22/19 07/31/19 Yes [provider]  warfarin (COUMADIN) 4 MG tablet Take 4 mg by mouth daily.   Yes [provider]  calcium carbonate (OS-CAL) 600 MG TABS tablet Take 1 tablet by mouth 2 (two) times daily.    [provider]  cetirizine (ZYRTEC) 10 MG tablet Take 10 mg by mouth daily.     [provider]  Diclofenac Sodium (DICLO GEL TD) Place onto the skin.    [provider]  glimepiride (AMARYL) 2 MG tablet Take 2 mg by mouth daily with breakfast.  01/29/15   [provider]  Multiple Vitamins-Minerals (CENTRUM SILVER 50+WOMEN) TABS Take 1 tablet by mouth daily.    [provider]  OXYGEN Inhale 2 L/min into the lungs as needed (for shortness of breath or comfort).    [provider]  rosuvastatin (CRESTOR) 5 MG tablet Take 5 mg by mouth daily at 6 PM.    [provider]  sacubitril-valsartan (ENTRESTO) 24-26 MG Take 1 tablet by mouth 2 (two) times daily. 02/03/19   Laqueta LindenKoneswaran, Suresh A, MD  warfarin (COUMADIN) 2.5 MG tablet Take by mouth.    [provider]    Physical Exam: Constitutional: Moderately built and nourished. Vitals:   07/24/19 1900 07/24/19 1902 07/24/19 2339  BP:  (!) 119/59 (!) 116/58  Pulse:  75 79  Resp:  18 17  Temp:  98.2 F (36.8 C) (!) 97.3 F (36.3 C)  TempSrc:  Oral Oral  SpO2:  97% 94%  Weight: 70.5 kg    Height: 5'  (1.524 m)     Eyes: Anicteric no pallor. ENMT: No discharge from the ears eyes nose and mouth. Neck: No JVD appreciated no mass felt. Respiratory: No rhonchi or crepitations. Cardiovascular: S1-S2 heard.  Systolic murmur. Abdomen: Soft nontender bowel sounds present. Musculoskeletal: No edema. Skin: No rash. Neurologic: Alert awake oriented to time place and person.  Moves all extremities. Psychiatric: Appears normal per normal affect.   Labs on Admission: I have personally reviewed following labs and imaging studies  CBC: Recent Labs  Lab 07/24/19 2200  WBC 6.0  NEUTROABS 4.0  HGB 9.0*  HCT 28.0*  MCV 87.2  PLT 142*   Basic Metabolic Panel: Recent Labs  Lab 07/24/19 2200  NA 135  K 3.9  CL 101  CO2 24  GLUCOSE 220*  BUN 25*  CREATININE 1.25*  CALCIUM 9.1  MG 1.2*   GFR: Estimated Creatinine Clearance: 28.8 mL/min (A) (by C-G formula based on SCr of 1.25 mg/dL (H)). Liver Function Tests: Recent Labs  Lab 07/24/19 2200  AST 26  ALT 12  ALKPHOS 49  BILITOT 0.6  PROT 5.4*  ALBUMIN 2.7*   No results for input(s): LIPASE, AMYLASE in the last 168 hours. No results for input(s): AMMONIA in the last 168 hours. Coagulation Profile: Recent Labs  Lab 07/24/19 2200  INR 2.7*   Cardiac Enzymes: No results for input(s): CKTOTAL, CKMB, CKMBINDEX, TROPONINI in the last 168 hours. BNP (last 3 results) No results for input(s): PROBNP in the last 8760 hours. HbA1C: No results for input(s): HGBA1C in the last 72 hours. CBG: Recent Labs  Lab 07/24/19 1902  GLUCAP 147*   Lipid Profile: No results for input(s): CHOL, HDL, LDLCALC, TRIG, CHOLHDL, LDLDIRECT in the last 72 hours. Thyroid Function Tests: Recent Labs    07/24/19 2200  TSH 0.661   Anemia Panel: No results for input(s): VITAMINB12, FOLATE, FERRITIN, TIBC, IRON, RETICCTPCT in the last 72 hours. Urine analysis: No results found for: COLORURINE, APPEARANCEUR, LABSPEC, PHURINE, GLUCOSEU, HGBUR,  BILIRUBINUR, KETONESUR, PROTEINUR, UROBILINOGEN, NITRITE, LEUKOCYTESUR Sepsis Labs: @LABRCNTIP (procalcitonin:4,lacticidven:4) ) Recent Results (from the past 240 hour(s))  SARS Coronavirus 2 [facility] order, Performed in [facility] hospital lab) Nasopharyngeal Nasopharyngeal Swab     Status: None   Collection Time: 07/24/19  7:50 PM   Specimen: Nasopharyngeal Swab  Result Value Ref Range Status   SARS Coronavirus 2 NEGATIVE NEGATIVE Final    Comment: (NOTE) If result is NEGATIVE SARS-CoV-2 target nucleic acids are NOT DETECTED. The SARS-CoV-2 RNA is generally detectable in upper and lower  respiratory specimens during the acute phase of infection. The lowest  concentration of SARS-CoV-2 viral copies this assay can detect is 250  copies / mL. A negative result does not preclude SARS-CoV-2 infection  and should not be used as the sole basis for treatment or other  patient management decisions.  A negative result may occur with  improper specimen collection / handling, submission of specimen other  than nasopharyngeal swab, presence of viral mutation(s) within the  areas targeted by this assay, and inadequate number of viral copies  (<250 copies / mL). A negative result must be combined with clinical  observations, patient history, and epidemiological information. If result is POSITIVE SARS-CoV-2 target nucleic acids are DETECTED. The SARS-CoV-2 RNA is generally detectable in upper and lower  respiratory specimens dur ing the acute phase of infection.  Positive  results are indicative of active infection with SARS-CoV-2.  Clinical  correlation with patient history and other diagnostic information is  necessary to determine patient infection status.  Positive results do  not rule out bacterial infection or co-infection with other viruses. If result is PRESUMPTIVE POSTIVE SARS-CoV-2 nucleic acids MAY BE PRESENT.   A presumptive positive result was obtained on the submitted specimen   and confirmed on repeat testing.  While 2019 novel coronavirus  (SARS-CoV-2) nucleic acids may be present in the submitted sample  additional confirmatory testing may be necessary for epidemiological  and / or clinical management purposes  to differentiate between  SARS-CoV-2 and other Sarbecovirus currently known to infect humans.  If clinically indicated additional testing with an alternate test  methodology [phone]) is advised. The SARS-CoV-2 RNA is generally  detectable in upper and lower respiratory sp ecimens during  the acute  phase of infection. The expected result is Negative. Fact Sheet for Patients:  BoilerBrush.com.cyhttps://www.fda.gov/media/136312/download Fact Sheet for Healthcare Providers: https://pope.com/https://www.fda.gov/media/136313/download This test is not yet approved or cleared by the [place] FDA and has been authorized for detection and/or diagnosis of SARS-CoV-2 by FDA under an Emergency Use Authorization (EUA).  This EUA will remain in effect (meaning this test can be used) for the duration of the COVID-19 declaration under Section 564(b)(1) of the Act, 21 U.S.C. section 360bbb-3(b)(1), unless the authorization is terminated or revoked sooner. Performed at [facility], [address] [place], [place] [address]      Radiological Exams on Admission: Dg Chest Port 1 View  Result Date: 07/24/2019 CLINICAL DATA:  Shortness of breath EXAM: PORTABLE CHEST 1 VIEW COMPARISON:  07/24/2019, 07/01/2019 FINDINGS: Diffuse bilateral interstitial and ground-glass opacity with more confluent airspace disease at the right base and left lower lung. Aeration on the right appears slightly improved. The heart is enlarged. Aortic atherosclerosis. No pneumothorax. IMPRESSION: 1. Slight improvement in aeration on the right. Residual fine interstitial and ground-glass opacity, presumably due to infection with more confluent airspace disease at the right base and left mid to lower lung. 2. Cardiomegaly  Electronically Signed   By: Jasmine PangKim  Fujinaga M.D.   On: 07/24/2019 21:20    EKG: Independently reviewed.  EKG done at 8 oh showing A. fib rate controlled.  Assessment/Plan Principal Problem:   Elevated troponin Active Problems:   Polymyalgia rheumatica (HCC)   Essential hypertension   History of DVT (deep vein thrombosis)   History of pulmonary embolism   History of congestive heart failure   Chest pain    1. Possible acute on chronic systolic heart failure last EF measured was 45 to 50% in July 2 months ago in 2020 -Lasix 40 mg IV was given the ER for now we will continue with home dose of Lasix 40 mg.  Discussed with cardiologist appreciate cardiology recommendations and at this time cardiology is planning to do a 2D echo to reassess patient's mitral regurgitation.  Closely follow metabolic panel intake output.  Patient is on Entresto and carvedilol. 2. Elevated troponin -denies any chest pain could be from CHF.  2D echo has been ordered by cardiologist. 3. History of A. fib on Coreg and Coumadin.  Coumadin will be dosed per pharmacy. 4. Hypomagnesemia replace recheck. 5. History of DVT and PE and protein C deficiency on Coumadin. 6. Chronic anemia likely from chronic disease follow CBC. 7. Diabetes mellitus type 2 we will keep patient on sliding scale coverage. 8. History of polymyalgia rheumatica on prednisone 5 mg daily. 9. Hyperlipidemia on statins. 10. Hypertension on carvedilol and Entresto. 11. Chronic kidney disease stage III note that patient is on Entresto and Lasix.  Closely follow metabolic panel. 12. Thrombocytopenia appears to be chronic.   DVT prophylaxis Coumadin. Code Status: Full code confirmed with patient. Family Communication: Discussed with patient's sister. Disposition Plan: Back to skilled nursing facility. Consults called: Cardiology. Admission status: Observation.   Eduard ClosArshad N Aariz Maish MD [facility] Pager [phone].  If 7PM-7AM, please  contact night-coverage [email or web address] Password [email or web address]  07/25/2019, 12:51 AM

## 2019-07-25 NOTE — Evaluation (Signed)
Physical Therapy Evaluation Patient Details Name: Rebecca Hanson MRN: [id number] DOB: [birth year] Today's Date: 07/25/2019   History of Present Illness  85yo female with recent bout of SOB, taken to [facility] and then discharged back to SNF. She then returned to [facility] and was ultimately transferred to [facility] due to elevated troponins. Covid test negative. PMH DM, CHF with EJF 45-50%, A-fib, protein C deficiency with hx of PE on coumadin, mitral regurgitation, CKD  Clinical Impression   Patient received in bed, sleeping but easily woken and requiring moderate encouragement to participate in session today due to fatigue and "just finding comfortable spot in bed".  Required ModA for functional bed mobility, politely declined/deferred standing due to fatigue and pain with mobility this afternoon. Able to maintain midline sitting with S without difficulty. Returned to bed with ModA, then was positioned to comfort. Per mobility tech, able to transfer with MinA with stedy earlier today. No chest pain, shortness of breath, or HR/BP changes noted during session. She will continue to benefit from skilled PT services in the acute setting, also recommend return to SNF moving forward due to significant mobility deficits.     Follow Up Recommendations SNF;Supervision/Assistance - 24 hour    Equipment Recommendations  Other (comment)(defer to next venue)    Recommendations for Other Services       Precautions / Restrictions Precautions Precautions: Fall;Other (comment) Precaution Comments: watch for CP, SOB, HR/BP changes, etc. Restrictions Weight Bearing Restrictions: No      Mobility  Bed Mobility Overal bed mobility: Needs Assistance Bed Mobility: Supine to Sit;Sit to Supine     Supine to sit: Mod assist Sit to supine: Mod assist   General bed mobility comments: ModA for supine to sit, sit to supine and positioning to comfort; politely declines transfer training due to fatigue  Transfers                 General transfer comment: DNT- fatigue  Ambulation/Gait             General Gait Details: DNT- fatigue  Stairs            Wheelchair Mobility    Modified Rankin (Stroke Patients Only)       Balance Overall balance assessment: Mild deficits observed, not formally tested                                           Pertinent Vitals/Pain Pain Assessment: No/denies pain    Home Living Family/patient expects to be discharged to:: Skilled nursing facility                 Additional Comments: does not want to go back to SNF but knows that she needs to; has it worked out so that she would have 24/7 assist at home    Prior Function Level of Independence: Independent with assistive device(s)         Comments: walker, SPC     Hand Dominance        Extremity/Trunk Assessment   Upper Extremity Assessment Upper Extremity Assessment: Generalized weakness    Lower Extremity Assessment Lower Extremity Assessment: Generalized weakness    Cervical / Trunk Assessment Cervical / Trunk Assessment: Kyphotic  Communication   Communication: No difficulties  Cognition Arousal/Alertness: Awake/alert Behavior During Therapy: WFL for tasks assessed/performed Overall Cognitive Status: Within Functional Limits for tasks assessed  General Comments      Exercises     Assessment/Plan    PT Assessment Patient needs continued PT services  PT Problem List Decreased strength;Decreased mobility;Decreased safety awareness;Decreased coordination;Obesity;Decreased activity tolerance;Cardiopulmonary status limiting activity;Decreased balance       PT Treatment Interventions DME instruction;Therapeutic activities;Gait training;Therapeutic exercise;Stair training;Patient/family education;Balance training;Functional mobility training;Neuromuscular re-education    PT Goals (Current goals  can be found in the Care Plan section)  Acute Rehab PT Goals Patient Stated Goal: go home PT Goal Formulation: With patient Time For Goal Achievement: 08/08/19 Potential to Achieve Goals: Fair    Frequency Min 2X/week   Barriers to discharge        Co-evaluation               AM-PAC PT "6 Clicks" Mobility  Outcome Measure Help needed turning from your back to your side while in a flat bed without using bedrails?: A Little Help needed moving from lying on your back to sitting on the side of a flat bed without using bedrails?: A Lot Help needed moving to and from a bed to a chair (including a wheelchair)?: A Lot Help needed standing up from a chair using your arms (e.g., wheelchair or bedside chair)?: A Lot Help needed to walk in hospital room?: A Lot Help needed climbing 3-5 steps with a railing? : Total 6 Click Score: 12    End of Session Equipment Utilized During Treatment: Oxygen Activity Tolerance: Patient tolerated treatment well Patient left: in bed;with call bell/phone within reach;with bed alarm set   PT Visit Diagnosis: Muscle weakness (generalized) (M62.81);Difficulty in walking, not elsewhere classified (R26.2)    Time: 1455-1520 PT Time Calculation (min) (ACUTE ONLY): 25 min   Charges:   PT Evaluation $PT Eval Moderate Complexity: 1 Mod PT Treatments $Therapeutic Activity: 8-22 mins        Deniece Ree PT, DPT, CBIS  Supplemental Physical Therapist [place]    Pager [phone] Acute Rehab Office [phone]

## 2019-07-25 NOTE — Progress Notes (Signed)
ANTICOAGULATION CONSULT NOTE - Follow Up Consult  Pharmacy Consult for Heparin Indication: Pulmonary embolism/protein S deficiency+ r/o cardiac ischemia  Allergies  Allergen Reactions  . Benadryl [Diphenhydramine Hcl (Sleep)] Other (See Comments)    "Makes me wild, edgy acting"  . Hand Sanitizer [Ethyl Alcohol (Skin Cleanser)] Swelling    Severe swelling  . Adhesive [Tape] Rash  . Penicillins Hives, Swelling and Rash    Did it involve swelling of the face/tongue/throat, SOB, or low BP? Did it involve sudden or severe rash/hives, skin peeling, or any reaction on the inside of your mouth or nose?  Did you need to seek medical attention at a hospital or doctor's office?  When did it last happen? If all above answers are "NO", may proceed with cephalosporin use..  . Soap Rash  . Streptomycin Sulfate [Streptomycin] Hives, Swelling and Rash    Patient Measurements: Height: 5' (152.4 cm) Weight: 155 lb 13.8 oz (70.7 kg) IBW/kg (Calculated) : 45.5 Heparin Dosing Weight: 61 kg  Vital Signs: Temp: 98.2 F (36.8 C) (09/14 0839) Temp Source: Oral (09/14 0839) BP: 120/50 (09/14 0839) Pulse Rate: 72 (09/14 0839)  Labs: Recent Labs    07/24/19 2200 07/25/19 0607  HGB 9.0* 8.8*  HCT 28.0* 27.4*  PLT 142* 141*  LABPROT 28.3* 30.1*  INR 2.7* 2.9*  CREATININE 1.25* 1.00  TROPONINIHS 1,310*  --     Estimated Creatinine Clearance: 36.1 mL/min (by C-G formula based on SCr of 1 mg/dL).  Assessment: Anticoag: warfarin PTA for hx Pulmonary embolism/protein S deficiency. INR= 2.9. Hold warfarin for cath and start heparin when INR<2. Anemia noted -PTA last known dose was 2.5mg /d  Goal of Therapy:  Heparin level 0.3-0.7 units/ml Monitor platelets by anticoagulation protocol: Yes   Plan:  Hold warfarin Daily INR Hep when INR<2   Rebecca Hanson S. Alford Highland, PharmD, BCPS Clinical Staff Pharmacist Eilene Ghazi Stillinger 07/25/2019,9:44 AM

## 2019-07-25 NOTE — Progress Notes (Signed)
Patient place in observation after midnight but care began prior to midnight.  Patient from a SNF and was sent her from [facility] for elevated troponin.  Patient says she had fever, chills and SOB.   Will change to in-patient as will exceed > 2 midnights  A/P:  ? Atypical pneumonia -procalcitonin elevated-- trend -patient endorses fever and chills but I could not find documentation of this in transfer data -oral abx and monitor symptoms  Elevated troponin -cardiology consult appreciated -anticipate R/L heart cath when INR low enough   acute on chronic systolic heart failure last EF measured was 45 to 50% in July 2 months ago in 2020 -Lasix 40 mg IV was given the ER for now we will continue with home dose of Lasix 40 mg.   -Patient is on Entresto and carvedilol.  Hypomagnesemia - replace   History of DVT and PE and protein C deficiency  -coumadin held in anticipation of cath-- heparin gtt when INR <2  Rebecca Bear DO

## 2019-07-25 NOTE — TOC Initial Note (Addendum)
Transition of Care [facility]) - Initial/Assessment Note    Patient Details  Name: Rebecca Hanson MRN: [id number] Date of Birth: 1932-03-10  Transition of Care [facility]) CM/SW Contact:    Leone Haven, RN Phone Number: 07/25/2019, 12:52 PM  Clinical Narrative:                 From home alone, sister at bedside, patient is from [facility], she states she agrees to  go back to snf if she needs to but really wants to go home if she does not have to go to SNF, will await pt eval.    9/15- per pt eval rec SNF, patient states she would like a private room at [facility].  She states if she does not get a private room then she does not want to go to SNF.  NCM contacted Elease Hashimoto at [facility]  [phone] ext [phone] she states she is working on getting her a private room and she will let me know in the am.  Patient states she would like for NCM to contact Juanita  [phone] and her son Loraine Leriche [phone].  NCM called Juanita, no answer, , NCM contacted son Loraine Leriche, he states patient has a DNR form on her refrigerator , NCM asked if he could bring that in,he states the patient is coherant and somone needs to have that conversation with her.  NCM informed Ernst Breach of this information.  He states MD informed him that patient will be having a heart cath on Thursday.  Expected Discharge Plan: Skilled Nursing Facility Barriers to Discharge: No Barriers Identified   Patient Goals and CMS Choice Patient states their goals for this hospitalization and ongoing recovery are:: go home ,live a normal life CMS Medicare.gov Compare Post Acute Care list provided to:: Patient Choice offered to / list presented to : Patient  Expected Discharge Plan and Services Expected Discharge Plan: Skilled Nursing Facility In-house Referral: NA Discharge Planning Services: CM Consult Post Acute Care Choice: Skilled Nursing Facility Living arrangements for the past 2 months: Skilled Nursing Facility(prior to SNF  from home)                 DME Arranged: (NA)         HH Arranged: NA          Prior Living Arrangements/Services Living arrangements for the past 2 months: Skilled Nursing Facility(prior to SNF from home) Lives with:: Self Patient language and need for interpreter reviewed:: Yes Do you feel safe going back to the place where you live?: Yes      Need for Family Participation in Patient Care: Yes (Comment) Care giver support system in place?: Yes (comment) Current home services: DME(she has walker, w/chair, hospital bed, bsc) Criminal Activity/Legal Involvement Pertinent to Current Situation/Hospitalization: No - Comment as needed  Activities of Daily Living Home Assistive Devices/Equipment: Hearing aid ADL Screening (condition at time of admission) Patient's cognitive ability adequate to safely complete daily activities?: Yes Is the patient deaf or have difficulty hearing?: No Does the patient have difficulty seeing, even when wearing glasses/contacts?: No Does the patient have difficulty concentrating, remembering, or making decisions?: Yes Patient able to express need for assistance with ADLs?: Yes Does the patient have difficulty dressing or bathing?: Yes Independently performs ADLs?: No Communication: Independent Dressing (OT): Needs assistance Grooming: Independent Feeding: Independent Bathing: Needs assistance Toileting: Needs assistance In/Out Bed: Needs assistance Walks in Home: Needs assistance Does the patient  have difficulty walking or climbing stairs?: Yes Weakness of Legs: Both Weakness of Arms/Hands: None  Permission Sought/Granted      Share Information with NAME: [place]  Permission granted to share info w AGENCY: [facility]  Permission granted to share info w Relationship: SIster  Permission granted to share info w Contact Information: Dorthula Rue  [phone]  Emotional Assessment Appearance:: Appears stated  age Attitude/Demeanor/Rapport: Engaged Affect (typically observed): Appropriate Orientation: : Oriented to Self, Oriented to Place, Oriented to  Time, Oriented to Situation Alcohol / Substance Use: Not Applicable Psych Involvement: No (comment)  Admission diagnosis:  NON ST ELEVATION NSTEMI Patient Active Problem List   Diagnosis Date Noted  . Elevated troponin 07/25/2019  . Chest pain 07/25/2019  . Dyspnea 07/25/2019  . SOB (shortness of breath)   . DCM (dilated cardiomyopathy) ([place])   . Nonrheumatic mitral valve regurgitation   . Polymyalgia rheumatica ([place]) 11/19/2016  . Primary osteoarthritis of both knees 11/19/2016  . Primary osteoarthritis of both hands 11/19/2016  . Pseudogout 11/19/2016  . Essential hypertension 11/19/2016  . Diabetes mellitus 11/19/2016  . History of DVT (deep vein thrombosis) 11/19/2016  . History of pulmonary embolism 11/19/2016  . History of congestive heart failure 11/19/2016   PCP:  Glenda Chroman, MD Pharmacy:  No Pharmacies Listed    Social Determinants of Health (SDOH) Interventions    Readmission Risk Interventions No flowsheet data found.

## 2019-07-26 LAB — MAGNESIUM: Magnesium: 2 mg/dL (ref 1.7–2.4)

## 2019-07-26 LAB — LIPID PANEL
Cholesterol: 90 mg/dL (ref 0–200)
HDL: 27 mg/dL — ABNORMAL LOW (ref >40)
LDL Cholesterol: 49 mg/dL (ref 0–99)
Total CHOL/HDL Ratio: 3.3 RATIO
Triglycerides: 71 mg/dL (ref <150)
VLDL: 14 mg/dL (ref 0–40)

## 2019-07-26 LAB — BASIC METABOLIC PANEL
Anion gap: 12 (ref 5–15)
BUN: 21 mg/dL (ref 8–23)
CO2: 23 mmol/L (ref 22–32)
Calcium: 8.6 mg/dL — ABNORMAL LOW (ref 8.9–10.3)
Chloride: 100 mmol/L (ref 98–111)
Creatinine, Ser: 0.87 mg/dL (ref 0.44–1.00)
GFR calc Af Amer: >60 mL/min (ref >60)
GFR calc non Af Amer: >60 mL/min (ref >60)
Glucose, Bld: 153 mg/dL — ABNORMAL HIGH (ref 70–99)
Potassium: 3.7 mmol/L (ref 3.5–5.1)
Sodium: 135 mmol/L (ref 135–145)

## 2019-07-26 LAB — CBC
HCT: 28.8 % — ABNORMAL LOW (ref 36.0–46.0)
Hemoglobin: 9.5 g/dL — ABNORMAL LOW (ref 12.0–15.0)
MCH: 28.1 pg (ref 26.0–34.0)
MCHC: 33 g/dL (ref 30.0–36.0)
MCV: 85.2 fL (ref 80.0–100.0)
Platelets: 145 10*3/uL — ABNORMAL LOW (ref 150–400)
RBC: 3.38 MIL/uL — ABNORMAL LOW (ref 3.87–5.11)
RDW: 15.5 % (ref 11.5–15.5)
WBC: 5.7 10*3/uL (ref 4.0–10.5)
nRBC: 0 % (ref 0.0–0.2)

## 2019-07-26 LAB — PROCALCITONIN: Procalcitonin: 1.59 ng/mL

## 2019-07-26 LAB — HEMOGLOBIN A1C
Hgb A1c MFr Bld: 7.6 % — ABNORMAL HIGH (ref 4.8–5.6)
Mean Plasma Glucose: 171 mg/dL

## 2019-07-26 LAB — PROTIME-INR
INR: 3.2 — ABNORMAL HIGH (ref 0.8–1.2)
Prothrombin Time: 32.6 seconds — ABNORMAL HIGH (ref 11.4–15.2)

## 2019-07-26 LAB — GLUCOSE, CAPILLARY
Glucose-Capillary: 142 mg/dL — ABNORMAL HIGH (ref 70–99)
Glucose-Capillary: 156 mg/dL — ABNORMAL HIGH (ref 70–99)
Glucose-Capillary: 212 mg/dL — ABNORMAL HIGH (ref 70–99)
Glucose-Capillary: 221 mg/dL — ABNORMAL HIGH (ref 70–99)

## 2019-07-26 MED ORDER — FUROSEMIDE 10 MG/ML IJ SOLN: 40.0000 mg | Freq: Every day | INTRAMUSCULAR | Status: DC

## 2019-07-26 MED ORDER — FUROSEMIDE 10 MG/ML IJ SOLN
20.0000 mg | Freq: Once | INTRAMUSCULAR | Status: AC
  Administered 2019-07-26: 20 mg via INTRAVENOUS
  Filled 2019-07-26: qty 2

## 2019-07-26 MED ORDER — FUROSEMIDE 10 MG/ML IJ SOLN
40.0000 mg | Freq: Every day | INTRAMUSCULAR | Status: DC
  Administered 2019-07-27: 40 mg via INTRAVENOUS
  Filled 2019-07-26: qty 4

## 2019-07-26 MED ORDER — FUROSEMIDE 10 MG/ML IJ SOLN: 20.0000 mg | Freq: Every day | INTRAMUSCULAR | Status: DC

## 2019-07-26 NOTE — Progress Notes (Addendum)
Progress Note  Patient Name: Rebecca Hanson Date of Encounter: 07/26/2019  Primary Cardiologist: Prentice Docker, MD   Subjective   No chest pain or SOB.  INR 3.2 today and coumadin on hold  Inpatient Medications    Scheduled Meds: . aspirin EC  324 mg Oral Once  . atorvastatin  10 mg Oral QPM  . azithromycin  250 mg Oral Daily  . carvedilol  12.5 mg Oral BID WC  . doxycycline  100 mg Oral Q12H  . furosemide  40 mg Oral Daily  . influenza vaccine adjuvanted  0.5 mL Intramuscular Tomorrow-1000  . insulin aspart  0-9 Units Subcutaneous TID WC  . potassium chloride SA  20 mEq Oral Daily  . predniSONE  5 mg Oral BID WC  . sacubitril-valsartan  1 tablet Oral BID   Continuous Infusions:  PRN Meds: acetaminophen **OR** acetaminophen, docusate sodium, ondansetron **OR** ondansetron (ZOFRAN) IV   Vital Signs    Vitals:   07/25/19 2357 07/26/19 0440 07/26/19 0500 07/26/19 0815  BP: 123/62 (!) 123/53  (!) 100/39  Pulse: 84 77  76  Resp: 16 15    Temp: 98.1 F (36.7 C) 98.2 F (36.8 C)    TempSrc: Oral Oral    SpO2: 98% 97%  99%  Weight:   71.1 kg   Height:        Intake/Output Summary (Last 24 hours) at 07/26/2019 0940 Last data filed at 07/26/2019 0813 Gross per 24 hour  Intake 440 ml  Output 550 ml  Net -110 ml   Last 3 Weights 07/26/2019 07/25/2019 07/24/2019  Weight (lbs) 156 lb 12 oz 155 lb 13.8 oz 155 lb 6.8 oz  Weight (kg) 71.1 kg 70.7 kg 70.5 kg      Telemetry    Atrial fibrillation with CVR - Personally Reviewed  ECG    No new EKG to review- Personally Reviewed  Physical Exam   GEN: Well nourished, well developed in no acute distress HEENT: Normal NECK: No JVD; No carotid bruits LYMPHATICS: No lymphadenopathy CARDIAC:irregularly irregular, no murmurs, rubs, gallops RESPIRATORY:  Crackles at bases ABDOMEN: Soft, non-tender, non-distended MUSCULOSKELETAL:  No edema; No deformity  SKIN: Warm and dry NEUROLOGIC:  Alert and oriented x 3  PSYCHIATRIC:  Normal affect    Labs    High Sensitivity Troponin:   Recent Labs  Lab 07/24/19 2200 07/25/19 0944 07/25/19 1038  TROPONINIHS 1,310* 916* 884*      Chemistry Recent Labs  Lab 07/24/19 2200 07/25/19 0607 07/26/19 0620  NA 135 135 135  K 3.9 4.0 3.7  CL 101 103 100  CO2 24 22 23   GLUCOSE 220* 98 153*  BUN 25* 27* 21  CREATININE 1.25* 1.00 0.87  CALCIUM 9.1 8.7* 8.6*  PROT 5.4*  --   --   ALBUMIN 2.7*  --   --   AST 26  --   --   ALT 12  --   --   ALKPHOS 49  --   --   BILITOT 0.6  --   --   GFRNONAA 39* 51* >60  GFRAA 45* 59* >60  ANIONGAP 10 10 12      Hematology Recent Labs  Lab 07/24/19 2200 07/25/19 0607 07/26/19 0620  WBC 6.0 6.3 5.7  RBC 3.21* 3.15* 3.38*  HGB 9.0* 8.8* 9.5*  HCT 28.0* 27.4* 28.8*  MCV 87.2 87.0 85.2  MCH 28.0 27.9 28.1  MCHC 32.1 32.1 33.0  RDW 15.7* 15.8* 15.5  PLT 142* 141* 145*  BNP Recent Labs  Lab 07/24/19 2200  BNP 459.9*     DDimer No results for input(s): DDIMER in the last 168 hours.   Radiology    Dg Chest Port 1 View  Result Date: 07/24/2019 CLINICAL DATA:  Shortness of breath EXAM: PORTABLE CHEST 1 VIEW COMPARISON:  07/24/2019, 07/01/2019 FINDINGS: Diffuse bilateral interstitial and ground-glass opacity with more confluent airspace disease at the right base and left lower lung. Aeration on the right appears slightly improved. The heart is enlarged. Aortic atherosclerosis. No pneumothorax. IMPRESSION: 1. Slight improvement in aeration on the right. Residual fine interstitial and ground-glass opacity, presumably due to infection with more confluent airspace disease at the right base and left mid to lower lung. 2. Cardiomegaly Electronically Signed   By: Donavan Foil M.D.   On: 07/24/2019 21:20    Cardiac Studies   2D Echo pending  06-11-16 Nuc stress test  Accentuation in resting ST segment depression noted in the inferolateral leads. Atrial fibrillation present throughout.  Small, mild  intensity, apical anterior defect that is partially reversible. This could be related to variable breast attenuation artifact, although a minor region of ischemia is also possible.  This is a high risk study based on reduced LVEF, no large ischemic territories identified however.  Nuclear stress EF: 30%.  TTE 06-08-19 1. The left ventricle has mildly reduced systolic function, with an ejection fraction of 45-50%. The cavity size was normal. Left ventricular diastolic function could not be evaluated secondary to atrial fibrillation. Elevated left atrial and left  ventricular end-diastolic pressures Left ventricular diffuse hypokinesis. 2. Left atrial size was severely dilated. 3. The mitral valve is degenerative. Mild thickening of the mitral valve leaflet. There is moderate mitral annular calcification present. Mitral valve regurgitation is moderate to severe by color flow Doppler. 4. The tricuspid valve is grossly normal. 5. The aortic valve is tricuspid. Mild thickening of the aortic valve. 6. The aorta is normal in size and structure. 7. The inferior vena cava was dilated in size with >50% respiratory variability. 8. There is right bowing of the interatrial septum, suggestive of elevated left atrial pressure.   Patient Profile     85 y.o. female w/ h/o afib (chronic), chronic systolic HF with EF 95-28%, mild MR/TR, h/o PE and protein S deficiency, HTN, DM2, but no prior h/o CAD, who is being seen today for the evaluation of elevated/abnormal troponin.   Assessment & Plan    Elevated Troponin/ ?NSTEMI vs demand ischemia in setting of PNA and anemia - Patient was transferred from [place] for elevated troponin. troponin 0.05 > 0.14 > 0.28 - Initial HSTroponin here [id number] - Patient has no history of CAD - So far patient has been symptom free with no CP. Patient had dyspnea at the ER in [facility] but improved with IV Lasix - EF 2 months ago was 45-50% and echo this  admit 40-45% with diffuse HK - With further elevation of Troponin, worsening LV dysfunction with hx of perfusion defect on nuc in the past and significant MR, recommend right and left heart cath to define coronary anatomy and assess MR. -Coumadin on hold in anticipation of R and L heart cath later this week once INR<1.5. -continue IV Heparin gtt  MR - noted to be moderate to severe on recent TEE but mild to moderate by echo this admit with moderate pulmonary HTN -assess further at cath  Acute on chronic Anemia - Hgb in 2018 was 11. This admission was 8.8 and  trending upward to 9.5 today. - Patient has not reported any bleeding - Will check a hemoccult - Recommend further evaluation per IM  Acute on Chronic systolic Heart Failure  - Patient initially presented to [facility] ER for dyspnea. She received IV lasix in the ER which seemed to improve SOB. Patient is on Lasix 40 mg daily at home.  - BNP 459 on admission - Patient is on 2L O2. She denies home O2 at baseline.  - she put out 1.1L yesterday and is net neg 800cc. - she has crackles at both bases today.  Will change to Lasix 40mg  IV daily - Monitor weight, I&Os - Continue Coreg 12.5 mg daily - per Dr. last note she was on Entresto 24-26mg  BID and did not tolerate higher doses due to hypotension but there is some question as to whether she was taking it at [facility]. - will keep on low dose Enetresto 24-26mg  bID - Creatinine improved 1.25 > 1.00 > 0.87   Permanent Afib - Rate controlled on coreg - continue carvedilol - coumadin on hold for cath.  INR 3.2 today. Start Heparin per pharmacy once INR < 2.5. - K+ 3.7 but last Mag was low as 1.2 - repeat mag level  Hyperlipidemia - Atorvastatin 10 mg home dose - LDL 49 this admit  Hypertension - Coreg home med - Stable  CKD stage III - Creatinine improved 1.25 > 1.00 - Avoid nephrotoxic agents  H/o DVT and PE and protein C deficiency - A/C Coumadin >> transition to IV  heparin in anticipation of cath  PNA - Per recent CXR - abx per IM  DM2 - per IM  I have spent a total of 35 minutes with patient reviewing notes , telemetry, EKGs, labs and examining patient as well as establishing an assessment and plan that was discussed with the patient.  > 50% of time was spent in direct patient care.    For questions or updates, please contact [facility] Please consult [email or web address] for contact info under        Signed, [facility], MD  07/26/2019, 9:40 AM

## 2019-07-26 NOTE — Progress Notes (Addendum)
Progress Note    Rebecca Hanson  ZWC:[id number] DOB: [birth year]  DOA: 07/24/2019 PCP: Ignatius Specking, MD    Brief Narrative:     Medical records reviewed and are as summarized below:  Rebecca Hanson is an 85 y.o. female with history of chronic systolic heart failure last EF measured in July 2020 was 45 to 50%, atrial fibrillation, history of protein C deficiency with pulmonary embolism on Coumadin, mitral regurgitation, diabetes mellitus, chronic kidney disease stage III, anemia was brought to the ER at [facility] with complaints of having shortness of breath was given 1 dose of Lasix and discharged back to skilled nursing facility.  Since patient had mildly elevated troponin patient was sent back to the ER for further work-up.  With the second visit patient was asymptomatic.  But given that patient's troponin was mildly increasing from 0.14 - 0.28 patient was transferred to [facility] since patient's cardiologist is at [facility].  Assessment/Plan:   Principal Problem:   Elevated troponin Active Problems:   Polymyalgia rheumatica (HCC)   Essential hypertension   History of DVT (deep vein thrombosis)   History of pulmonary embolism   History of congestive heart failure   Chest pain   SOB (shortness of breath)   DCM (dilated cardiomyopathy) (HCC)   Nonrheumatic mitral valve regurgitation   Dyspnea  Atypical pneumonia -x ray suggestive of above -procalcitonin elevated-- trending down with addition of abx -patient endorses fever and chills but I could not find documentation of this in transfer data -oral abx  Elevated troponin -cardiology consult appreciated -anticipate R/L heart cath when INR low enough (<1.5)   acute on chronic systolic heart failure last EF measured was 45 to 50% in July 2 months ago in 2020-Lasix 40 mg IV was given the ER for now we will continue with home dose of Lasix 40 mg.  -does not appear that patient was on Entresto per [facility]  from SNF (defer to cardiology) - carvedilol.  Hypomagnesemia - replace and recheck  History of DVT and PE and protein C deficiency  -coumadin held in anticipation of cath-- heparin gtt when INR <2  obesity Body mass index is 30.61 kg/m.   Family Communication/Anticipated D/C date and plan/Code Status   DVT prophylaxis: coumadin to heparin Code Status: Full Code.  Family Communication: spoke with sister Disposition Plan: from SNF- social work consult   Medical Consultants:    cards  Subjective:   Feels less congested  Objective:    Vitals:   07/25/19 2357 07/26/19 0440 07/26/19 0500 07/26/19 0815  BP: 123/62 (!) 123/53  (!) 100/39  Pulse: 84 77  76  Resp: 16 15    Temp: 98.1 F (36.7 C) 98.2 F (36.8 C)    TempSrc: Oral Oral    SpO2: 98% 97%  99%  Weight:   71.1 kg   Height:        Intake/Output Summary (Last 24 hours) at 07/26/2019 0912 Last data filed at 07/26/2019 0813 Gross per 24 hour  Intake 440 ml  Output 550 ml  Net -110 ml   Filed Weights   07/24/19 1900 07/25/19 0426 07/26/19 0500  Weight: 70.5 kg 70.7 kg 71.1 kg    Exam: In bed, on 2LO2 NAD irr but rate controlled Clear, no wheezing A+OX3  Data Reviewed:   I have personally reviewed following labs and imaging studies:  Labs: Labs show the following:   Basic Metabolic Panel: Recent Labs  Lab 07/24/19 2200 07/25/19  3086 07/26/19 0620  NA 135 135 135  K 3.9 4.0 3.7  CL 101 103 100  CO2 24 22 23   GLUCOSE 220* 98 153*  BUN 25* 27* 21  CREATININE 1.25* 1.00 0.87  CALCIUM 9.1 8.7* 8.6*  MG 1.2*  --   --    GFR Estimated Creatinine Clearance: 41.6 mL/min (by C-G formula based on SCr of 0.87 mg/dL). Liver Function Tests: Recent Labs  Lab 07/24/19 2200  AST 26  ALT 12  ALKPHOS 49  BILITOT 0.6  PROT 5.4*  ALBUMIN 2.7*   No results for input(s): LIPASE, AMYLASE in the last 168 hours. No results for input(s): AMMONIA in the last 168 hours. Coagulation profile  Recent Labs  Lab 07/24/19 2200 07/25/19 0607 07/26/19 0620  INR 2.7* 2.9* 3.2*    CBC: Recent Labs  Lab 07/24/19 2200 07/25/19 0607 07/26/19 0620  WBC 6.0 6.3 5.7  NEUTROABS 4.0  --   --   HGB 9.0* 8.8* 9.5*  HCT 28.0* 27.4* 28.8*  MCV 87.2 87.0 85.2  PLT 142* 141* 145*   Cardiac Enzymes: No results for input(s): CKTOTAL, CKMB, CKMBINDEX, TROPONINI in the last 168 hours. BNP (last 3 results) No results for input(s): PROBNP in the last 8760 hours. CBG: Recent Labs  Lab 07/25/19 1218 07/25/19 1615 07/25/19 2103 07/25/19 2218 07/26/19 0639  GLUCAP 200* 175* 198* 169* 142*   D-Dimer: No results for input(s): DDIMER in the last 72 hours. Hgb A1c: Recent Labs    07/25/19 0607  HGBA1C 7.6*   Lipid Profile: Recent Labs    07/26/19 0620  CHOL 90  HDL 27*  LDLCALC 49  TRIG 71  CHOLHDL 3.3   Thyroid function studies: Recent Labs    07/24/19 2200  TSH 0.661   Anemia work up: No results for input(s): VITAMINB12, FOLATE, FERRITIN, TIBC, IRON, RETICCTPCT in the last 72 hours. Sepsis Labs: Recent Labs  Lab 07/24/19 2200 07/25/19 0607 07/26/19 0620  PROCALCITON  --  2.94 1.59  WBC 6.0 6.3 5.7    Microbiology Recent Results (from the past 240 hour(s))  SARS Coronavirus 2 [facility] order, Performed in [facility] hospital lab) Nasopharyngeal Nasopharyngeal Swab     Status: None   Collection Time: 07/24/19  7:50 PM   Specimen: Nasopharyngeal Swab  Result Value Ref Range Status   SARS Coronavirus 2 NEGATIVE NEGATIVE Final    Comment: (NOTE) If result is NEGATIVE SARS-CoV-2 target nucleic acids are NOT DETECTED. The SARS-CoV-2 RNA is generally detectable in upper and lower  respiratory specimens during the acute phase of infection. The lowest  concentration of SARS-CoV-2 viral copies this assay can detect is 250  copies / mL. A negative result does not preclude SARS-CoV-2 infection  and should not be used as the sole basis for treatment or other   patient management decisions.  A negative result may occur with  improper specimen collection / handling, submission of specimen other  than nasopharyngeal swab, presence of viral mutation(s) within the  areas targeted by this assay, and inadequate number of viral copies  (<250 copies / mL). A negative result must be combined with clinical  observations, patient history, and epidemiological information. If result is POSITIVE SARS-CoV-2 target nucleic acids are DETECTED. The SARS-CoV-2 RNA is generally detectable in upper and lower  respiratory specimens dur ing the acute phase of infection.  Positive  results are indicative of active infection with SARS-CoV-2.  Clinical  correlation with patient history and other diagnostic information is  necessary to determine patient infection status.  Positive results do  not rule out bacterial infection or co-infection with other viruses. If result is PRESUMPTIVE POSTIVE SARS-CoV-2 nucleic acids MAY BE PRESENT.   A presumptive positive result was obtained on the submitted specimen  and confirmed on repeat testing.  While 2019 novel coronavirus  (SARS-CoV-2) nucleic acids may be present in the submitted sample  additional confirmatory testing may be necessary for epidemiological  and / or clinical management purposes  to differentiate between  SARS-CoV-2 and other Sarbecovirus currently known to infect humans.  If clinically indicated additional testing with an alternate test  methodology [phone]) is advised. The SARS-CoV-2 RNA is generally  detectable in upper and lower respiratory sp ecimens during the acute  phase of infection. The expected result is Negative. Fact Sheet for Patients:  BoilerBrush.com.cy Fact Sheet for Healthcare Providers: https://pope.com/ This test is not yet approved or cleared by the [place] FDA and has been authorized for detection and/or diagnosis of SARS-CoV-2 by  FDA under an Emergency Use Authorization (EUA).  This EUA will remain in effect (meaning this test can be used) for the duration of the COVID-19 declaration under Section 564(b)(1) of the Act, 21 U.S.C. section 360bbb-3(b)(1), unless the authorization is terminated or revoked sooner. Performed at [facility], [address] [place], [place] [address]     Procedures and diagnostic studies:  Dg Chest Port 1 View  Result Date: 07/24/2019 CLINICAL DATA:  Shortness of breath EXAM: PORTABLE CHEST 1 VIEW COMPARISON:  07/24/2019, 07/01/2019 FINDINGS: Diffuse bilateral interstitial and ground-glass opacity with more confluent airspace disease at the right base and left lower lung. Aeration on the right appears slightly improved. The heart is enlarged. Aortic atherosclerosis. No pneumothorax. IMPRESSION: 1. Slight improvement in aeration on the right. Residual fine interstitial and ground-glass opacity, presumably due to infection with more confluent airspace disease at the right base and left mid to lower lung. 2. Cardiomegaly Electronically Signed   By: Jasmine Pang M.D.   On: 07/24/2019 21:20    Medications:   . aspirin EC  324 mg Oral Once  . atorvastatin  10 mg Oral QPM  . azithromycin  250 mg Oral Daily  . carvedilol  12.5 mg Oral BID WC  . doxycycline  100 mg Oral Q12H  . furosemide  40 mg Oral Daily  . influenza vaccine adjuvanted  0.5 mL Intramuscular Tomorrow-1000  . insulin aspart  0-9 Units Subcutaneous TID WC  . potassium chloride SA  20 mEq Oral Daily  . predniSONE  5 mg Oral BID WC  . sacubitril-valsartan  1 tablet Oral BID   Continuous Infusions:   LOS: 2 days   Joseph Art  [facility]   How to contact the [facility] Attending or Consulting provider 7A - 7P or covering provider during after hours 7P -7A, for this patient?  1. Check the care team in [facility] and look for a) attending/consulting TRH provider listed and b) the [facility] team listed 2. Log into  [email or web address] and use [place]'s universal password to access. If you do not have the password, please contact the hospital operator. 3. Locate the [facility] provider you are looking for under [facility] and page to a number that you can be directly reached. 4. If you still have difficulty reaching the provider, please page the [facility] (Director on Call) for the Hospitalists listed on amion for assistance.  07/26/2019, 9:12 AM

## 2019-07-26 NOTE — Progress Notes (Addendum)
ANTICOAGULATION CONSULT NOTE - Follow Up Consult  Pharmacy Consult for Heparin Indication: Pulmonary embolism/protein S deficiency+ r/o cardiac ischemia  Allergies  Allergen Reactions  . Benadryl [Diphenhydramine Hcl (Sleep)] Other (See Comments)    "Makes me wild, edgy acting"  . Hand Sanitizer [Ethyl Alcohol (Skin Cleanser)] Swelling    Severe swelling  . Adhesive [Tape] Rash  . Penicillins Hives, Swelling and Rash    Did it involve swelling of the face/tongue/throat, SOB, or low BP? Did it involve sudden or severe rash/hives, skin peeling, or any reaction on the inside of your mouth or nose?  Did you need to seek medical attention at a hospital or doctor's office?  When did it last happen? If all above answers are "NO", may proceed with cephalosporin use..  . Soap Rash  . Streptomycin Sulfate [Streptomycin] Hives, Swelling and Rash    Patient Measurements: Height: 5' (152.4 cm) Weight: 156 lb 12 oz (71.1 kg) IBW/kg (Calculated) : 45.5 Heparin Dosing Weight: 61 kg  Vital Signs: Temp: 98.2 F (36.8 C) (09/15 0440) Temp Source: Oral (09/15 0440) BP: 100/39 (09/15 0815) Pulse Rate: 76 (09/15 0815)  Labs: Recent Labs    07/24/19 2200 07/25/19 0607 07/25/19 0944 07/25/19 1038 07/26/19 0620  HGB 9.0* 8.8*  --   --  9.5*  HCT 28.0* 27.4*  --   --  28.8*  PLT 142* 141*  --   --  145*  LABPROT 28.3* 30.1*  --   --  32.6*  INR 2.7* 2.9*  --   --  3.2*  CREATININE 1.25* 1.00  --   --  0.87  TROPONINIHS 1,310*  --  916* 884*  --     Estimated Creatinine Clearance: 41.6 mL/min (by C-G formula based on SCr of 0.87 mg/dL).  Assessment: Anticoag: warfarin PTA for hx Pulmonary embolism/protein S deficiency. INR= 2.9>>3.2. Hold warfarin for cath and start heparin when INR<2. Anemia noted but stable. -PTA last known dose was 2.5mg /d  Goal of Therapy:  Heparin level 0.3-0.7 units/ml Monitor platelets by anticoagulation protocol: Yes   Plan:  Hold warfarin Daily  INR Hep when INR<2 Recheck magnesium?   Veria Stradley S. Alford Highland, PharmD, BCPS Clinical Staff Pharmacist Eilene Ghazi Stillinger 07/26/2019,9:10 AM

## 2019-07-27 ENCOUNTER — Inpatient Hospital Stay (HOSPITAL_COMMUNITY): Payer: Medicare Other

## 2019-07-27 DIAGNOSIS — R079 Chest pain, unspecified: Secondary | ICD-10-CM

## 2019-07-27 LAB — PROTIME-INR
INR: 3.8 — ABNORMAL HIGH (ref 0.8–1.2)
Prothrombin Time: 36.6 seconds — ABNORMAL HIGH (ref 11.4–15.2)

## 2019-07-27 LAB — CBC
HCT: 28.7 % — ABNORMAL LOW (ref 36.0–46.0)
Hemoglobin: 9.2 g/dL — ABNORMAL LOW (ref 12.0–15.0)
MCH: 27.9 pg (ref 26.0–34.0)
MCHC: 32.1 g/dL (ref 30.0–36.0)
MCV: 87 fL (ref 80.0–100.0)
Platelets: 169 10*3/uL (ref 150–400)
RBC: 3.3 MIL/uL — ABNORMAL LOW (ref 3.87–5.11)
RDW: 15.2 % (ref 11.5–15.5)
WBC: 6.8 10*3/uL (ref 4.0–10.5)
nRBC: 0 % (ref 0.0–0.2)

## 2019-07-27 LAB — BASIC METABOLIC PANEL
Anion gap: 9 (ref 5–15)
BUN: 21 mg/dL (ref 8–23)
CO2: 22 mmol/L (ref 22–32)
Calcium: 8.6 mg/dL — ABNORMAL LOW (ref 8.9–10.3)
Chloride: 105 mmol/L (ref 98–111)
Creatinine, Ser: 0.98 mg/dL (ref 0.44–1.00)
GFR calc Af Amer: >60 mL/min (ref >60)
GFR calc non Af Amer: 53 mL/min — ABNORMAL LOW (ref >60)
Glucose, Bld: 168 mg/dL — ABNORMAL HIGH (ref 70–99)
Potassium: 4.2 mmol/L (ref 3.5–5.1)
Sodium: 136 mmol/L (ref 135–145)

## 2019-07-27 LAB — GLUCOSE, CAPILLARY
Glucose-Capillary: 155 mg/dL — ABNORMAL HIGH (ref 70–99)
Glucose-Capillary: 184 mg/dL — ABNORMAL HIGH (ref 70–99)
Glucose-Capillary: 226 mg/dL — ABNORMAL HIGH (ref 70–99)
Glucose-Capillary: 114 mg/dL — ABNORMAL HIGH (ref 70–99)

## 2019-07-27 LAB — IRON AND TIBC
Iron: 34 ug/dL (ref 28–170)
Saturation Ratios: 16 % (ref 10.4–31.8)
TIBC: 218 ug/dL — ABNORMAL LOW (ref 250–450)
UIBC: 184 ug/dL

## 2019-07-27 LAB — BRAIN NATRIURETIC PEPTIDE: B Natriuretic Peptide: 176.8 pg/mL — ABNORMAL HIGH (ref 0.0–100.0)

## 2019-07-27 LAB — RETICULOCYTES
Immature Retic Fract: 15.5 % (ref 2.3–15.9)
RBC.: 3.6 MIL/uL — ABNORMAL LOW (ref 3.87–5.11)
Retic Count, Absolute: 93.6 10*3/uL (ref 19.0–186.0)
Retic Ct Pct: 2.6 % (ref 0.4–3.1)

## 2019-07-27 LAB — PROCALCITONIN: Procalcitonin: 0.84 ng/mL

## 2019-07-27 LAB — FOLATE: Folate: 23.7 ng/mL (ref >5.9)

## 2019-07-27 LAB — FERRITIN: Ferritin: 143 ng/mL (ref 11–307)

## 2019-07-27 LAB — VITAMIN B12: Vitamin B-12: 407 pg/mL (ref 180–914)

## 2019-07-27 MED ORDER — VITAMIN K1 10 MG/ML IJ SOLN
2.0000 mg | Freq: Once | INTRAVENOUS | Status: AC
  Administered 2019-07-27: 2 mg via INTRAVENOUS
  Filled 2019-07-27: qty 0.2

## 2019-07-27 MED ORDER — PHYTONADIONE 5 MG PO TABS
5.0000 mg | ORAL_TABLET | Freq: Once | ORAL | Status: AC
  Administered 2019-07-27: 5 mg via ORAL
  Filled 2019-07-27: qty 1

## 2019-07-27 NOTE — Progress Notes (Signed)
ANTICOAGULATION CONSULT NOTE - Follow Up Consult  Pharmacy Consult for Heparin Indication: Pulmonary embolism/protein S deficiency+ r/o cardiac ischemia  Allergies  Allergen Reactions  . Benadryl [Diphenhydramine Hcl (Sleep)] Other (See Comments)    "Makes me wild, edgy acting"  . Hand Sanitizer [Ethyl Alcohol (Skin Cleanser)] Swelling    Severe swelling  . Adhesive [Tape] Rash  . Penicillins Hives, Swelling and Rash    Did it involve swelling of the face/tongue/throat, SOB, or low BP? Did it involve sudden or severe rash/hives, skin peeling, or any reaction on the inside of your mouth or nose?  Did you need to seek medical attention at a hospital or doctor's office?  When did it last happen? If all above answers are "NO", may proceed with cephalosporin use..  . Soap Rash  . Streptomycin Sulfate [Streptomycin] Hives, Swelling and Rash    Patient Measurements: Height: 5' (152.4 cm) Weight: 156 lb 12 oz (71.1 kg) IBW/kg (Calculated) : 45.5 Heparin Dosing Weight: 61 kg  Vital Signs: Temp: 97.7 F (36.5 C) (09/16 0758) Temp Source: Oral (09/16 0758) BP: 90/43 (09/16 0827) Pulse Rate: 74 (09/16 0827)  Labs: Recent Labs    07/24/19 2200 07/25/19 0607 07/25/19 0944 07/25/19 1038 07/26/19 0620 07/27/19 0513  HGB 9.0* 8.8*  --   --  9.5* 9.2*  HCT 28.0* 27.4*  --   --  28.8* 28.7*  PLT 142* 141*  --   --  145* 169  LABPROT 28.3* 30.1*  --   --  32.6* 36.6*  INR 2.7* 2.9*  --   --  3.2* 3.8*  CREATININE 1.25* 1.00  --   --  0.87  --   TROPONINIHS 1,310*  --  916* 884*  --   --     Estimated Creatinine Clearance: 41.6 mL/min (by C-G formula based on SCr of 0.87 mg/dL).  Assessment:  Anticoag: Warfarin PTA for hx PE/protein S deficiency. INR= 2.9>>3.2>>3.8. Hold warfarin for cath and start heparin when INR<2. Anemia noted but stable. -PTA last known dose was 2.5mg /d - Vit K 5mg  po x 1 and Vit K 2mg  IV  Goal of Therapy:  Heparin level 0.3-0.7 units/ml Monitor  platelets by anticoagulation protocol: Yes   Plan:  Hold warfarin, Daily INR Hep when INR<2 Vit K 5mg  po x 1 + Vit K 2mg  IV x 1 Hopefully cath 9/17 PM   Fitzpatrick Alberico S. Alford Highland, PharmD, BCPS Clinical Staff Pharmacist Eilene Ghazi Stillinger 07/27/2019,9:14 AM

## 2019-07-27 NOTE — Progress Notes (Addendum)
PROGRESS NOTE    Rebecca Hanson  IOE:[id number] DOB: 1932-10-09 DOA: 07/24/2019 PCP: Ignatius Specking, MD  Brief Narrative:Rebecca Hanson is an 85 y.o. female withhistory of chronic systolic heart failure last EF measured in July 2020 was 45 to 50%, atrial fibrillation, history of protein C deficiency with pulmonary embolism on Coumadin, mitral regurgitation, diabetes mellitus, chronic kidney disease stage III, anemia was brought to the ER at [facility] with complaints of having shortness of breath was given 1 dose of Lasix and discharged back to skilled nursing facility. Since patient had mildly elevated troponin patient was sent back to the ER for further work-up. With the second visit patient was asymptomatic. But given that patient's troponin was mildly increasing from 0.14 - 0.28patient was transferred to [facility]   Assessment & Plan:   Non-ST elevation MI versus demand ischemia -High-sensitivity troponin peak was 1310 -2D echocardiogram showed EF of 45 to 50% with diffuse hypokinesis -Cardiology following, plan for left heart catheterization when INR is less than 1.5 -Continue Coreg and statin  Acute on chronic systolic CHF -Echo as noted above -Clinically appears slightly over volume overloaded but BP is stopped -Cardiology following, started on Entresto this admission, plan for cath as noted above  Moderate to severe regurgitation -Noted on 2D echo -Cath later this week  Acute on chronic anemia -Likely worsened in the setting of hemodilution, improved appropriately with diuresis to 9.5, monitor with diuresis -Check anemia panel -Patient denies any overt blood loss  Permanent atrial fibrillation -Currently in A. fib, rate controlled -Continue carvedilol, INR supratherapeutic on Coumadin, currently being held  Stage III chronic kidney disease -Stable, monitor with diuresis  History of DVT/PE, protein C deficiency -On Coumadin at baseline, currently being held,  INR is 3.8 today -Vitamin K now, INR needs to be less than 1.5 for left heart cath, transition to IV heparin when INR is less than 2  Type 2 diabetes mellitus -Stable, continue sliding scale insulin  History of PMR -Continue low-dose prednisone  Possible atypical pneumonia versus CHF -Clinically symptoms more suggestive of CHF, however given elevated procalcitonin level as well agree with doxycycline x5 days  DVT prophylaxis: INR supratherapeutic Code Status: Full code Family Communication: No family at bedside Disposition Plan: Home pending above work-up  Consultants:   Cardiology   Procedures:   Antimicrobials:    Subjective: -Feels weak, mild dyspnea with activity  Objective: Vitals:   07/27/19 0034 07/27/19 0405 07/27/19 0758 07/27/19 0827  BP: 115/65 116/62 (!) 98/43 (!) 90/43  Pulse: 83 72 80 74  Resp: 16 18 18    Temp: (!) 97.4 F (36.3 C) 98.2 F (36.8 C) 97.7 F (36.5 C)   TempSrc: Oral Oral Oral   SpO2: 98% 100% 98% 100%  Weight: 71.1 kg     Height:        Intake/Output Summary (Last 24 hours) at 07/27/2019 1058 Last data filed at 07/27/2019 0858 Gross per 24 hour  Intake 240 ml  Output 901 ml  Net -661 ml   Filed Weights   07/25/19 0426 07/26/19 0500 07/27/19 0034  Weight: 70.7 kg 71.1 kg 71.1 kg    Examination:  General exam: Pleasant female sitting in bed, AAO x3, no distress Respiratory system: Few basilar crackles bilaterally Cardiovascular system: S1-S2/irregularly irregular gastrointestinal system: Abdomen is nondistended, soft and nontender.Normal bowel sounds heard. Central nervous system: Alert and oriented. No focal neurological deficits. Extremities: Trace edema Skin: No rashes, lesions or ulcers Psychiatry: Mood & affect appropriate.  Data Reviewed:   CBC: Recent Labs  Lab 07/24/19 2200 07/25/19 0607 07/26/19 0620 07/27/19 0513  WBC 6.0 6.3 5.7 6.8  NEUTROABS 4.0  --   --   --   HGB 9.0* 8.8* 9.5* 9.2*  HCT 28.0*  27.4* 28.8* 28.7*  MCV 87.2 87.0 85.2 87.0  PLT 142* 141* 145* 003   Basic Metabolic Panel: Recent Labs  Lab 07/24/19 2200 07/25/19 0607 07/26/19 0620 07/27/19 0513  NA 135 135 135 136  K 3.9 4.0 3.7 4.2  CL 101 103 100 105  CO2 24 22 23 22   GLUCOSE 220* 98 153* 168*  BUN 25* 27* 21 21  CREATININE 1.25* 1.00 0.87 0.98  CALCIUM 9.1 8.7* 8.6* 8.6*  MG 1.2*  --  2.0  --    GFR: Estimated Creatinine Clearance: 36.9 mL/min (by C-G formula based on SCr of 0.98 mg/dL). Liver Function Tests: Recent Labs  Lab 07/24/19 2200  AST 26  ALT 12  ALKPHOS 49  BILITOT 0.6  PROT 5.4*  ALBUMIN 2.7*   No results for input(s): LIPASE, AMYLASE in the last 168 hours. No results for input(s): AMMONIA in the last 168 hours. Coagulation Profile: Recent Labs  Lab 07/24/19 2200 07/25/19 0607 07/26/19 0620 07/27/19 0513  INR 2.7* 2.9* 3.2* 3.8*   Cardiac Enzymes: No results for input(s): CKTOTAL, CKMB, CKMBINDEX, TROPONINI in the last 168 hours. BNP (last 3 results) No results for input(s): PROBNP in the last 8760 hours. HbA1C: Recent Labs    07/25/19 0607  HGBA1C 7.6*   CBG: Recent Labs  Lab 07/26/19 0639 07/26/19 1102 07/26/19 1608 07/26/19 2127 07/27/19 0606  GLUCAP 142* 156* 212* 221* 155*   Lipid Profile: Recent Labs    07/26/19 0620  CHOL 90  HDL 27*  LDLCALC 49  TRIG 71  CHOLHDL 3.3   Thyroid Function Tests: Recent Labs    07/24/19 2200  TSH 0.661   Anemia Panel: No results for input(s): VITAMINB12, FOLATE, FERRITIN, TIBC, IRON, RETICCTPCT in the last 72 hours. Urine analysis: No results found for: COLORURINE, APPEARANCEUR, LABSPEC, PHURINE, GLUCOSEU, HGBUR, BILIRUBINUR, KETONESUR, PROTEINUR, UROBILINOGEN, NITRITE, LEUKOCYTESUR Sepsis Labs: @LABRCNTIP (procalcitonin:4,lacticidven:4)  ) Recent Results (from the past 240 hour(s))  SARS Coronavirus 2 [facility] order, Performed in [facility] hospital lab) Nasopharyngeal Nasopharyngeal Swab     Status:  None   Collection Time: 07/24/19  7:50 PM   Specimen: Nasopharyngeal Swab  Result Value Ref Range Status   SARS Coronavirus 2 NEGATIVE NEGATIVE Final    Comment: (NOTE) If result is NEGATIVE SARS-CoV-2 target nucleic acids are NOT DETECTED. The SARS-CoV-2 RNA is generally detectable in upper and lower  respiratory specimens during the acute phase of infection. The lowest  concentration of SARS-CoV-2 viral copies this assay can detect is 250  copies / mL. A negative result does not preclude SARS-CoV-2 infection  and should not be used as the sole basis for treatment or other  patient management decisions.  A negative result may occur with  improper specimen collection / handling, submission of specimen other  than nasopharyngeal swab, presence of viral mutation(s) within the  areas targeted by this assay, and inadequate number of viral copies  (<250 copies / mL). A negative result must be combined with clinical  observations, patient history, and epidemiological information. If result is POSITIVE SARS-CoV-2 target nucleic acids are DETECTED. The SARS-CoV-2 RNA is generally detectable in upper and lower  respiratory specimens dur ing the acute phase of infection.  Positive  results are  indicative of active infection with SARS-CoV-2.  Clinical  correlation with patient history and other diagnostic information is  necessary to determine patient infection status.  Positive results do  not rule out bacterial infection or co-infection with other viruses. If result is PRESUMPTIVE POSTIVE SARS-CoV-2 nucleic acids MAY BE PRESENT.   A presumptive positive result was obtained on the submitted specimen  and confirmed on repeat testing.  While 2019 novel coronavirus  (SARS-CoV-2) nucleic acids may be present in the submitted sample  additional confirmatory testing may be necessary for epidemiological  and / or clinical management purposes  to differentiate between  SARS-CoV-2 and other  Sarbecovirus currently known to infect humans.  If clinically indicated additional testing with an alternate test  methodology [phone]) is advised. The SARS-CoV-2 RNA is generally  detectable in upper and lower respiratory sp ecimens during the acute  phase of infection. The expected result is Negative. Fact Sheet for Patients:  BoilerBrush.com.cy Fact Sheet for Healthcare Providers: https://pope.com/ This test is not yet approved or cleared by the [place] FDA and has been authorized for detection and/or diagnosis of SARS-CoV-2 by FDA under an Emergency Use Authorization (EUA).  This EUA will remain in effect (meaning this test can be used) for the duration of the COVID-19 declaration under Section 564(b)(1) of the Act, 21 U.S.C. section 360bbb-3(b)(1), unless the authorization is terminated or revoked sooner. Performed at [facility], [address], [place], [place] [address]          Radiology Studies: Dg Chest 2 View  Result Date: 07/27/2019 CLINICAL DATA:  Congestive heart failure.  Right-sided chest pain. EXAM: CHEST - 2 VIEW COMPARISON:  Chest x-ray dated July 24, 2019. FINDINGS: Unchanged mild cardiomegaly. Essentially resolved interstitial and right basilar opacities. Unchanged scarring/atelectasis in the left mid lung. No pleural effusion or pneumothorax. No acute osseous abnormality. IMPRESSION: 1. Essentially resolved pulmonary edema. Electronically Signed   By: Obie Dredge M.D.   On: 07/27/2019 10:25        Scheduled Meds: . atorvastatin  10 mg Oral QPM  . azithromycin  250 mg Oral Daily  . carvedilol  12.5 mg Oral BID WC  . doxycycline  100 mg Oral Q12H  . insulin aspart  0-9 Units Subcutaneous TID WC  . predniSONE  5 mg Oral BID WC  . sacubitril-valsartan  1 tablet Oral BID   Continuous Infusions: . phytonadione (VITAMIN K) IV 2 mg (07/27/19 1043)     LOS: 3 days    Time spent:     Zannie Cove, MD [facility]   07/27/2019, 10:58 AM

## 2019-07-27 NOTE — H&P (View-Only) (Signed)
Progress Note  Patient Name: Rebecca Hanson Date of Encounter: 07/27/2019  Primary Cardiologist: Kate Sable, MD   Subjective   Patient feels tired today. On O2 as needed for breathing. Denies chest pain. INR 3.8 today and coumadin on hold. Patient received Vitamin K 5 mg this AM.   Inpatient Medications    Scheduled Meds: . aspirin EC  324 mg Oral Once  . atorvastatin  10 mg Oral QPM  . azithromycin  250 mg Oral Daily  . carvedilol  12.5 mg Oral BID WC  . doxycycline  100 mg Oral Q12H  . furosemide  40 mg Intravenous Daily  . insulin aspart  0-9 Units Subcutaneous TID WC  . phytonadione  5 mg Oral Once  . potassium chloride SA  20 mEq Oral Daily  . predniSONE  5 mg Oral BID WC  . sacubitril-valsartan  1 tablet Oral BID   Continuous Infusions:  PRN Meds: acetaminophen **OR** acetaminophen, docusate sodium, ondansetron **OR** ondansetron (ZOFRAN) IV   Vital Signs    Vitals:   07/26/19 1637 07/26/19 2022 07/27/19 0034 07/27/19 0405  BP: (!) 116/54 (!) 98/59 115/65 116/62  Pulse: 76 62 83 72  Resp: 18 16 16 18   Temp: 98.5 F (36.9 C) 97.9 F (36.6 C) (!) 97.4 F (36.3 C) 98.2 F (36.8 C)  TempSrc: Oral Oral Oral Oral  SpO2: 100% 98% 98% 100%  Weight:   71.1 kg   Height:        Intake/Output Summary (Last 24 hours) at 07/27/2019 0744 Last data filed at 07/27/2019 0001 Gross per 24 hour  Intake 360 ml  Output 901 ml  Net -541 ml   Last 3 Weights 07/27/2019 07/26/2019 07/25/2019  Weight (lbs) 156 lb 12 oz 156 lb 12 oz 155 lb 13.8 oz  Weight (kg) 71.1 kg 71.1 kg 70.7 kg      Telemetry    Afib, HR 70-80s - Personally Reviewed  ECG    No new - Personally Reviewed  Physical Exam   GEN: No acute distress.   Neck: No JVD Cardiac: Irreg Irreg, no murmurs, rubs, or gallops.  Respiratory: Crackles bilaterally; 2L O2 as needed GI: Soft, nontender, non-distended  MS: Minimal B/L lower leg edema; No deformity. Neuro:  Nonfocal  Psych: Normal affect    Labs    High Sensitivity Troponin:   Recent Labs  Lab 07/24/19 2200 07/25/19 0944 07/25/19 1038  TROPONINIHS 1,310* 916* 884*      Chemistry Recent Labs  Lab 07/24/19 2200 07/25/19 0607 07/26/19 0620  NA 135 135 135  K 3.9 4.0 3.7  CL 101 103 100  CO2 24 22 23   GLUCOSE 220* 98 153*  BUN 25* 27* 21  CREATININE 1.25* 1.00 0.87  CALCIUM 9.1 8.7* 8.6*  PROT 5.4*  --   --   ALBUMIN 2.7*  --   --   AST 26  --   --   ALT 12  --   --   ALKPHOS 49  --   --   BILITOT 0.6  --   --   GFRNONAA 39* 51* >60  GFRAA 45* 59* >60  ANIONGAP 10 10 12      Hematology Recent Labs  Lab 07/25/19 0607 07/26/19 0620 07/27/19 0513  WBC 6.3 5.7 6.8  RBC 3.15* 3.38* 3.30*  HGB 8.8* 9.5* 9.2*  HCT 27.4* 28.8* 28.7*  MCV 87.0 85.2 87.0  MCH 27.9 28.1 27.9  MCHC 32.1 33.0 32.1  RDW 15.8* 15.5 15.2  PLT 141* 145* 169    BNP Recent Labs  Lab 07/24/19 2200  BNP 459.9*     DDimer No results for input(s): DDIMER in the last 168 hours.   Radiology    No results found.  Cardiac Studies   Echo 07/25/19 1. The left ventricle has mild-moderately reduced systolic function, with an ejection fraction of 40-45%. The cavity size was normal. Left ventricular diastolic Doppler parameters are indeterminate. Left ventricular diffuse hypokinesis.  2. Normal RV size with mildly decreased systolic function.  3. There is mild mitral annular calcification present. Mitral valve regurgitation is mild to moderate by color flow Doppler. No evidence of mitral valve stenosis.  4. The aortic valve is tricuspid. Mild calcification of the aortic valve. No stenosis of the aortic valve.  5. The aortic root is normal in size and structure.  6. Left atrial size was severely dilated.  7. Right atrial size was mildly dilated.  8. The inferior vena cava was dilated in size with <50% respiratory variability. PA systolic pressure 44 mmHg.  06-11-16 Nuc stress test  Accentuation in resting ST segment depression  noted in the inferolateral leads. Atrial fibrillation present throughout.  Small, mild intensity, apical anterior defect that is partially reversible. This could be related to variable breast attenuation artifact, although a minor region of ischemia is also possible.  This is a high risk study based on reduced LVEF, no large ischemic territories identified however.  Nuclear stress EF: 30%.  Patient Profile     85 y.o. female w/ h/o afib (chronic), chronic systolic HF with EF 30-35%, mild MR/TR, h/o PE and protein S deficiency, HTN, DM2, but no prior h/o CAD,who is being seen today for the evaluation ofelevated/abnormal troponin.  Assessment & Plan      Elevated Troponin/ ?NSTEMI vs demand ischemia in setting of PNA and anemia - Patient was transferred from [facility] ER for elevated troponin. troponin 0.05 > 0.14 > 0.28 - Initial HSTroponin here 1310>>916>>884 - Patient has no history of CAD - So far patient has been symptom free with no CP. Patient had dyspnea at the ER in [facility] but improved with IV Lasix - EF 2 months ago was 45-50% and echo this admit 40-45% with diffuse HK - With further elevation of Troponin, worsening LV dysfunction with hx of perfusion defect on nuc in the past and significant MR, recommend right and left heart cath to define coronary anatomy and assess MR. -Coumadin on hold in anticipation of R and L heart cath later this week once INR<2. - INR today 3.8 >> Vitamin K 5 mg given this AM -IV Heparin gtt per pharmacy  MR - noted to be moderate to severe on recent TEE but mild to moderate by echo this admit with moderate pulmonary HTN -assess further at cath  Acute on chronic Anemia - Hgb in 2018 was 11. This admission was 8.8 and trending upward to 9.2 today. - Patient has not reported any bleeding - Ordered a hemoccult >> not collected - Stable  Acute on Chronic systolic Heart Failure  - Patient initially presented to [facility] ER for dyspnea. She  received IV lasix in the ER which seemed to improve SOB. Patient is on Lasix 40 mg daily at home.  - BNP 459 on admission - Patient is on 2L O2 PRN. She denies home O2 at baseline.  - she put out -1.5 L since admission - she has crackles at both bases >> Continue Lasix 40mg IV daily -   Monitor weight, I&Os - Continue Coreg 12.5 mg daily - per Dr. Sharene Skeans last note she was on Entresto 24-26mg  BID and did not tolerate higher doses due to hypotension but there is some question as to whether she was taking it at [facility]. - will keep on low dose Enetresto 24-26mg  BID - Creatinine improved 1.25 > 1.00 > 0.87> AM labs pending  Permanent Afib - Rate controlled on coreg - continue carvedilol - coumadin on hold for cath.  - INR 3.8 today >> Vitamin K given. Start Heparin per pharmacy once INR < 2.0. - K+ 3.7 >> on Potassium 20 mEq daily - Mag today 2.0   Hyperlipidemia - Atorvastatin 10 mg home dose - LDL 49 this admit  Hypertension - Coreg home med - Stable  CKD stage III - Creatinine improved 1.25 > 1.00 > 0.86 > AM labs pending - Avoid nephrotoxic agents  H/o DVT and PE and protein C deficiency - A/C Coumadin >> transition to IV heparin in anticipation of cath  PNA - Per recent CXR - abx per IM  DM2 - per IM - A1C 7.6   For questions or updates, please contact [facility] Please consult [email or web address] for contact info under        Signed, Pio Eatherly David Stall, PA-C  07/27/2019, 7:44 AM

## 2019-07-27 NOTE — Progress Notes (Addendum)
Progress Note  Patient Name: Rebecca Hanson Date of Encounter: 07/27/2019  Primary Cardiologist: Kate Sable, MD   Subjective   Patient feels tired today. On O2 as needed for breathing. Denies chest pain. INR 3.8 today and coumadin on hold. Patient received Vitamin K 5 mg this AM.   Inpatient Medications    Scheduled Meds: . aspirin EC  324 mg Oral Once  . atorvastatin  10 mg Oral QPM  . azithromycin  250 mg Oral Daily  . carvedilol  12.5 mg Oral BID WC  . doxycycline  100 mg Oral Q12H  . furosemide  40 mg Intravenous Daily  . insulin aspart  0-9 Units Subcutaneous TID WC  . phytonadione  5 mg Oral Once  . potassium chloride SA  20 mEq Oral Daily  . predniSONE  5 mg Oral BID WC  . sacubitril-valsartan  1 tablet Oral BID   Continuous Infusions:  PRN Meds: acetaminophen **OR** acetaminophen, docusate sodium, ondansetron **OR** ondansetron (ZOFRAN) IV   Vital Signs    Vitals:   07/26/19 1637 07/26/19 2022 07/27/19 0034 07/27/19 0405  BP: (!) 116/54 (!) 98/59 115/65 116/62  Pulse: 76 62 83 72  Resp: 18 16 16 18   Temp: 98.5 F (36.9 C) 97.9 F (36.6 C) (!) 97.4 F (36.3 C) 98.2 F (36.8 C)  TempSrc: Oral Oral Oral Oral  SpO2: 100% 98% 98% 100%  Weight:   71.1 kg   Height:        Intake/Output Summary (Last 24 hours) at 07/27/2019 0744 Last data filed at 07/27/2019 0001 Gross per 24 hour  Intake 360 ml  Output 901 ml  Net -541 ml   Last 3 Weights 07/27/2019 07/26/2019 07/25/2019  Weight (lbs) 156 lb 12 oz 156 lb 12 oz 155 lb 13.8 oz  Weight (kg) 71.1 kg 71.1 kg 70.7 kg      Telemetry    Afib, HR 70-80s - Personally Reviewed  ECG    No new - Personally Reviewed  Physical Exam   GEN: No acute distress.   Neck: No JVD Cardiac: Irreg Irreg, no murmurs, rubs, or gallops.  Respiratory: Crackles bilaterally; 2L O2 as needed GI: Soft, nontender, non-distended  MS: Minimal B/L lower leg edema; No deformity. Neuro:  Nonfocal  Psych: Normal affect    Labs    High Sensitivity Troponin:   Recent Labs  Lab 07/24/19 2200 07/25/19 0944 07/25/19 1038  TROPONINIHS 1,310* 916* 884*      Chemistry Recent Labs  Lab 07/24/19 2200 07/25/19 0607 07/26/19 0620  NA 135 135 135  K 3.9 4.0 3.7  CL 101 103 100  CO2 24 22 23   GLUCOSE 220* 98 153*  BUN 25* 27* 21  CREATININE 1.25* 1.00 0.87  CALCIUM 9.1 8.7* 8.6*  PROT 5.4*  --   --   ALBUMIN 2.7*  --   --   AST 26  --   --   ALT 12  --   --   ALKPHOS 49  --   --   BILITOT 0.6  --   --   GFRNONAA 39* 51* >60  GFRAA 45* 59* >60  ANIONGAP 10 10 12      Hematology Recent Labs  Lab 07/25/19 0607 07/26/19 0620 07/27/19 0513  WBC 6.3 5.7 6.8  RBC 3.15* 3.38* 3.30*  HGB 8.8* 9.5* 9.2*  HCT 27.4* 28.8* 28.7*  MCV 87.0 85.2 87.0  MCH 27.9 28.1 27.9  MCHC 32.1 33.0 32.1  RDW 15.8* 15.5 15.2  PLT 141* 145* 169    BNP Recent Labs  Lab 07/24/19 2200  BNP 459.9*     DDimer No results for input(s): DDIMER in the last 168 hours.   Radiology    No results found.  Cardiac Studies   Echo 07/25/19 1. The left ventricle has mild-moderately reduced systolic function, with an ejection fraction of 40-45%. The cavity size was normal. Left ventricular diastolic Doppler parameters are indeterminate. Left ventricular diffuse hypokinesis.  2. Normal RV size with mildly decreased systolic function.  3. There is mild mitral annular calcification present. Mitral valve regurgitation is mild to moderate by color flow Doppler. No evidence of mitral valve stenosis.  4. The aortic valve is tricuspid. Mild calcification of the aortic valve. No stenosis of the aortic valve.  5. The aortic root is normal in size and structure.  6. Left atrial size was severely dilated.  7. Right atrial size was mildly dilated.  8. The inferior vena cava was dilated in size with <50% respiratory variability. PA systolic pressure 44 mmHg.  06-11-16 Nuc stress test  Accentuation in resting ST segment depression  noted in the inferolateral leads. Atrial fibrillation present throughout.  Small, mild intensity, apical anterior defect that is partially reversible. This could be related to variable breast attenuation artifact, although a minor region of ischemia is also possible.  This is a high risk study based on reduced LVEF, no large ischemic territories identified however.  Nuclear stress EF: 30%.  Patient Profile     85 y.o. female w/ h/o afib (chronic), chronic systolic HF with EF 30-35%, mild MR/TR, h/o PE and protein S deficiency, HTN, DM2, but no prior h/o CAD,who is being seen today for the evaluation ofelevated/abnormal troponin.  Assessment & Plan      Elevated Troponin/ ?NSTEMI vs demand ischemia in setting of PNA and anemia - Patient was transferred from [facility] ER for elevated troponin. troponin 0.05 > 0.14 > 0.28 - Initial HSTroponin here 1310>>916>>884 - Patient has no history of CAD - So far patient has been symptom free with no CP. Patient had dyspnea at the ER in [facility] but improved with IV Lasix - EF 2 months ago was 45-50% and echo this admit 40-45% with diffuse HK - With further elevation of Troponin, worsening LV dysfunction with hx of perfusion defect on nuc in the past and significant MR, recommend right and left heart cath to define coronary anatomy and assess MR. -Coumadin on hold in anticipation of R and L heart cath later this week once INR<2. - INR today 3.8 >> Vitamin K 5 mg given this AM -IV Heparin gtt per pharmacy  MR - noted to be moderate to severe on recent TEE but mild to moderate by echo this admit with moderate pulmonary HTN -assess further at cath  Acute on chronic Anemia - Hgb in 2018 was 11. This admission was 8.8 and trending upward to 9.2 today. - Patient has not reported any bleeding - Ordered a hemoccult >> not collected - Stable  Acute on Chronic systolic Heart Failure  - Patient initially presented to [facility] ER for dyspnea. She  received IV lasix in the ER which seemed to improve SOB. Patient is on Lasix 40 mg daily at home.  - BNP 459 on admission - Patient is on 2L O2 PRN. She denies home O2 at baseline.  - she put out -1.5 L since admission - she has crackles at both bases >> Continue Lasix 40mg  IV daily -  Monitor weight, I&Os - Continue Coreg 12.5 mg daily - per Dr. Sharene Skeans last note she was on Entresto 24-26mg  BID and did not tolerate higher doses due to hypotension but there is some question as to whether she was taking it at [facility]. - will keep on low dose Enetresto 24-26mg  BID - Creatinine improved 1.25 > 1.00 > 0.87> AM labs pending  Permanent Afib - Rate controlled on coreg - continue carvedilol - coumadin on hold for cath.  - INR 3.8 today >> Vitamin K given. Start Heparin per pharmacy once INR < 2.0. - K+ 3.7 >> on Potassium 20 mEq daily - Mag today 2.0   Hyperlipidemia - Atorvastatin 10 mg home dose - LDL 49 this admit  Hypertension - Coreg home med - Stable  CKD stage III - Creatinine improved 1.25 > 1.00 > 0.86 > AM labs pending - Avoid nephrotoxic agents  H/o DVT and PE and protein C deficiency - A/C Coumadin >> transition to IV heparin in anticipation of cath  PNA - Per recent CXR - abx per IM  DM2 - per IM - A1C 7.6   For questions or updates, please contact [facility] Please consult [email or web address] for contact info under        Signed, Lloyd Ayo David Stall, PA-C  07/27/2019, 7:44 AM

## 2019-07-27 NOTE — Care Management Important Message (Signed)
Important Message  Patient Details  Name: Rebecca Hanson MRN: [id number] Date of Birth: 1932-08-24   Medicare Important Message Given:  Yes     Shelda Altes 07/27/2019, 1:52 PM

## 2019-07-27 NOTE — Progress Notes (Signed)
Physical Therapy Treatment Patient Details Name: Rebecca Hanson MRN: [id number] DOB: [birth year] Today's Date: 07/27/2019    History of Present Illness 85yo female with recent bout of SOB, taken to [facility] and then discharged back to SNF. She then returned to [facility] and was ultimately transferred to [facility] due to elevated troponins. Covid test negative. PMH DM, CHF with EJF 45-50%, A-fib, protein C deficiency with hx of PE on coumadin, mitral regurgitation, CKD    PT Comments    Pt agreeable to getting out of bed to chair. Pt is limited in safe mobility by decreased strength, balance and endurance. Pt progressing towards her goals, and is currently min A for bed mobility and min A for power up to [facility] for transfer to recliner. Once in the recliner pt agreeable to therapeutic exercise for strengthening so she can step to the recliner. D/c plan remains appropriate at this time. PT will continue to follow acutely.   Follow Up Recommendations  SNF;Supervision/Assistance - 24 hour     Equipment Recommendations  Other (comment)(defer to next venue)       Precautions / Restrictions Precautions Precautions: Fall;Other (comment) Precaution Comments: watch for CP, SOB, HR/BP changes, etc. Restrictions Weight Bearing Restrictions: No    Mobility  Bed Mobility Overal bed mobility: Needs Assistance Bed Mobility: Supine to Sit;Sit to Supine     Supine to sit: Min assist;HOB elevated     General bed mobility comments: pt able to move LE off bed and bring trunk to upright only requiring min A for pad scoot of hip to EoB  Transfers Overall transfer level: Needs assistance   Transfers: Sit to/from Stand           General transfer comment: minA for power up to [facility] for transfer to recliner   Ambulation/Gait             General Gait Details: DNT- fatigue         Balance Overall balance assessment: Mild deficits observed, not formally tested                                           Cognition Arousal/Alertness: Awake/alert Behavior During Therapy: WFL for tasks assessed/performed Overall Cognitive Status: Within Functional Limits for tasks assessed                                        Exercises General Exercises - Lower Extremity Quad Sets: AROM;Both;10 reps;Seated Heel Slides: AROM;Both;10 reps;Seated Hip Flexion/Marching: AROM;Both;10 reps;Seated Toe Raises: AROM;Both;10 reps;Seated    General Comments General comments (skin integrity, edema, etc.): VSS, no c/o of SOB or CP with movement      Pertinent Vitals/Pain Pain Assessment: No/denies pain           PT Goals (current goals can now be found in the care plan section) Acute Rehab PT Goals Patient Stated Goal: go home PT Goal Formulation: With patient Time For Goal Achievement: 08/08/19 Potential to Achieve Goals: Fair Progress towards PT goals: Progressing toward goals    Frequency    Min 2X/week      PT Plan Current plan remains appropriate       AM-PAC PT "6 Clicks" Mobility   Outcome Measure  Help needed turning from your back to your side while in a flat bed  without using bedrails?: A Little Help needed moving from lying on your back to sitting on the side of a flat bed without using bedrails?: A Lot Help needed moving to and from a bed to a chair (including a wheelchair)?: A Lot Help needed standing up from a chair using your arms (e.g., wheelchair or bedside chair)?: A Lot Help needed to walk in hospital room?: A Lot Help needed climbing 3-5 steps with a railing? : Total 6 Click Score: 12    End of Session Equipment Utilized During Treatment: Oxygen Activity Tolerance: Patient tolerated treatment well Patient left: in bed;with call bell/phone within reach;with bed alarm set Nurse Communication: Mobility status PT Visit Diagnosis: Muscle weakness (generalized) (M62.81);Difficulty in walking, not elsewhere classified (R26.2)      Time: 3664-4034 PT Time Calculation (min) (ACUTE ONLY): 18 min  Charges:  $Therapeutic Activity: 8-22 mins                     Audric Venn B. Migdalia Dk PT, DPT Acute Rehabilitation Services Pager [phone] Office [phone]    [place] 07/27/2019, 4:48 PM

## 2019-07-28 ENCOUNTER — Encounter (HOSPITAL_COMMUNITY): Payer: Self-pay | Admitting: Cardiovascular Disease

## 2019-07-28 ENCOUNTER — Ambulatory Visit (HOSPITAL_COMMUNITY): Admission: RE | Admit: 2019-07-28 | Payer: Medicare Other | Source: Home / Self Care | Admitting: Cardiovascular Disease

## 2019-07-28 ENCOUNTER — Encounter (HOSPITAL_COMMUNITY)
Admission: AD | Disposition: A | Discharge status: Skilled Nursing Facility | Payer: Medicare Other | Source: Other Acute Inpatient Hospital | Attending: Internal Medicine

## 2019-07-28 HISTORY — PX: RIGHT/LEFT HEART CATH AND CORONARY ANGIOGRAPHY: CATH118266

## 2019-07-28 LAB — POCT I-STAT EG7
Acid-base deficit: 1 mmol/L (ref 0.0–2.0)
Bicarbonate: 24.2 mmol/L (ref 20.0–28.0)
Bicarbonate: 25.2 mmol/L (ref 20.0–28.0)
Calcium, Ion: 1.26 mmol/L (ref 1.15–1.40)
Calcium, Ion: 1.29 mmol/L (ref 1.15–1.40)
HCT: 28 % — ABNORMAL LOW (ref 36.0–46.0)
HCT: 28 % — ABNORMAL LOW (ref 36.0–46.0)
Hemoglobin: 9.5 g/dL — ABNORMAL LOW (ref 12.0–15.0)
Hemoglobin: 9.5 g/dL — ABNORMAL LOW (ref 12.0–15.0)
O2 Saturation: 65 %
O2 Saturation: 69 %
Potassium: 4.2 mmol/L (ref 3.5–5.1)
Potassium: 4.3 mmol/L (ref 3.5–5.1)
Sodium: 139 mmol/L (ref 135–145)
Sodium: 139 mmol/L (ref 135–145)
TCO2: 25 mmol/L (ref 22–32)
TCO2: 26 mmol/L (ref 22–32)
pCO2, Ven: 39.3 mmHg — ABNORMAL LOW (ref 44.0–60.0)
pCO2, Ven: 40.2 mmHg — ABNORMAL LOW (ref 44.0–60.0)
pH, Ven: 7.397 (ref 7.250–7.430)
pH, Ven: 7.406 (ref 7.250–7.430)
pO2, Ven: 34 mmHg (ref 32.0–45.0)
pO2, Ven: 36 mmHg (ref 32.0–45.0)

## 2019-07-28 LAB — POCT I-STAT 7, (LYTES, BLD GAS, ICA,H+H)
Acid-base deficit: 1 mmol/L (ref 0.0–2.0)
Bicarbonate: 23.4 mmol/L (ref 20.0–28.0)
Calcium, Ion: 1.29 mmol/L (ref 1.15–1.40)
HCT: 28 % — ABNORMAL LOW (ref 36.0–46.0)
Hemoglobin: 9.5 g/dL — ABNORMAL LOW (ref 12.0–15.0)
O2 Saturation: 98 %
Potassium: 4.2 mmol/L (ref 3.5–5.1)
Sodium: 138 mmol/L (ref 135–145)
TCO2: 25 mmol/L (ref 22–32)
pCO2 arterial: 36.5 mmHg (ref 32.0–48.0)
pH, Arterial: 7.416 (ref 7.350–7.450)
pO2, Arterial: 99 mmHg (ref 83.0–108.0)

## 2019-07-28 LAB — BASIC METABOLIC PANEL
Anion gap: 10 (ref 5–15)
BUN: 23 mg/dL (ref 8–23)
CO2: 24 mmol/L (ref 22–32)
Calcium: 8.5 mg/dL — ABNORMAL LOW (ref 8.9–10.3)
Chloride: 101 mmol/L (ref 98–111)
Creatinine, Ser: 1.08 mg/dL — ABNORMAL HIGH (ref 0.44–1.00)
GFR calc Af Amer: 54 mL/min — ABNORMAL LOW (ref >60)
GFR calc non Af Amer: 47 mL/min — ABNORMAL LOW (ref >60)
Glucose, Bld: 166 mg/dL — ABNORMAL HIGH (ref 70–99)
Potassium: 4.4 mmol/L (ref 3.5–5.1)
Sodium: 135 mmol/L (ref 135–145)

## 2019-07-28 LAB — CBC
HCT: 29.2 % — ABNORMAL LOW (ref 36.0–46.0)
Hemoglobin: 9.8 g/dL — ABNORMAL LOW (ref 12.0–15.0)
MCH: 28.8 pg (ref 26.0–34.0)
MCHC: 33.6 g/dL (ref 30.0–36.0)
MCV: 85.9 fL (ref 80.0–100.0)
Platelets: 178 10*3/uL (ref 150–400)
RBC: 3.4 MIL/uL — ABNORMAL LOW (ref 3.87–5.11)
RDW: 15.4 % (ref 11.5–15.5)
WBC: 7.9 10*3/uL (ref 4.0–10.5)
nRBC: 0 % (ref 0.0–0.2)

## 2019-07-28 LAB — PROTIME-INR
INR: 1.4 — ABNORMAL HIGH (ref 0.8–1.2)
Prothrombin Time: 16.6 seconds — ABNORMAL HIGH (ref 11.4–15.2)

## 2019-07-28 LAB — GLUCOSE, CAPILLARY
Glucose-Capillary: 158 mg/dL — ABNORMAL HIGH (ref 70–99)
Glucose-Capillary: 142 mg/dL — ABNORMAL HIGH (ref 70–99)
Glucose-Capillary: 154 mg/dL — ABNORMAL HIGH (ref 70–99)

## 2019-07-28 LAB — HEPARIN LEVEL (UNFRACTIONATED): Heparin Unfractionated: <0.1 IU/mL — ABNORMAL LOW (ref 0.30–0.70)

## 2019-07-28 SURGERY — RIGHT/LEFT HEART CATH AND CORONARY ANGIOGRAPHY
Anesthesia: LOCAL

## 2019-07-28 MED ORDER — SODIUM CHLORIDE 0.9 % WEIGHT BASED INFUSION: 1.0000 mL/kg/h | INTRAVENOUS | Status: DC

## 2019-07-28 MED ORDER — ASPIRIN 81 MG PO CHEW
81.0000 mg | CHEWABLE_TABLET | Freq: Every day | ORAL | Status: DC
  Administered 2019-07-29: 81 mg via ORAL
  Filled 2019-07-28: qty 1

## 2019-07-28 MED ORDER — LABETALOL HCL 5 MG/ML IV SOLN: 10.0000 mg | INTRAVENOUS | Status: AC | PRN

## 2019-07-28 MED ORDER — SODIUM CHLORIDE 0.9% FLUSH: 3.0000 mL | INTRAVENOUS | Status: DC | PRN

## 2019-07-28 MED ORDER — HYDRALAZINE HCL 20 MG/ML IJ SOLN: 10.0000 mg | INTRAMUSCULAR | Status: AC | PRN

## 2019-07-28 MED ORDER — SODIUM CHLORIDE 0.9 % WEIGHT BASED INFUSION
3.0000 mL/kg/h | INTRAVENOUS | Status: AC
  Administered 2019-07-28: 3 mL/kg/h via INTRAVENOUS

## 2019-07-28 MED ORDER — DIAZEPAM 2 MG PO TABS: 2.0000 mg | ORAL_TABLET | Freq: Four times a day (QID) | ORAL | Status: DC | PRN

## 2019-07-28 MED ORDER — HEPARIN (PORCINE) 25000 UT/250ML-% IV SOLN
900.0000 [IU]/h | INTRAVENOUS | Status: DC
  Administered 2019-07-28: 900 [IU]/h via INTRAVENOUS
  Filled 2019-07-28: qty 250

## 2019-07-28 MED ORDER — ASPIRIN 81 MG PO CHEW: 81.0000 mg | CHEWABLE_TABLET | ORAL | Status: DC

## 2019-07-28 MED ORDER — HEPARIN SODIUM (PORCINE) 1000 UNIT/ML IJ SOLN
INTRAMUSCULAR | Status: DC | PRN
  Administered 2019-07-28: 3500 [IU] via INTRAVENOUS

## 2019-07-28 MED ORDER — SODIUM CHLORIDE 0.9% FLUSH
3.0000 mL | Freq: Two times a day (BID) | INTRAVENOUS | Status: DC
  Administered 2019-07-28: 17:00:00 3 mL via INTRAVENOUS

## 2019-07-28 MED ORDER — HEPARIN (PORCINE) 25000 UT/250ML-% IV SOLN
1100.0000 [IU]/h | INTRAVENOUS | Status: DC
  Administered 2019-07-29: 900 [IU]/h via INTRAVENOUS
  Filled 2019-07-28: qty 250

## 2019-07-28 MED ORDER — LIDOCAINE HCL (PF) 1 % IJ SOLN
INTRAMUSCULAR | Status: DC | PRN
  Administered 2019-07-28 (×2): 2 mL

## 2019-07-28 MED ORDER — SODIUM CHLORIDE 0.9 % WEIGHT BASED INFUSION
1.0000 mL/kg/h | INTRAVENOUS | Status: DC
  Administered 2019-07-28: 1 mL/kg/h via INTRAVENOUS

## 2019-07-28 MED ORDER — ASPIRIN 81 MG PO CHEW
81.0000 mg | CHEWABLE_TABLET | ORAL | Status: AC
  Administered 2019-07-28: 81 mg via ORAL
  Filled 2019-07-28: qty 1

## 2019-07-28 MED ORDER — SODIUM CHLORIDE 0.9% FLUSH
3.0000 mL | Freq: Two times a day (BID) | INTRAVENOUS | Status: DC
  Administered 2019-07-28 – 2019-07-29 (×3): 3 mL via INTRAVENOUS

## 2019-07-28 MED ORDER — HEPARIN (PORCINE) IN NACL 1000-0.9 UT/500ML-% IV SOLN
INTRAVENOUS | Status: DC | PRN
  Administered 2019-07-28 (×2): 500 mL

## 2019-07-28 MED ORDER — SODIUM CHLORIDE 0.9 % IV SOLN: 250.0000 mL | INTRAVENOUS | Status: DC | PRN

## 2019-07-28 MED ORDER — SODIUM CHLORIDE 0.9 % WEIGHT BASED INFUSION: 3.0000 mL/kg/h | INTRAVENOUS | Status: DC

## 2019-07-28 MED ORDER — ACETAMINOPHEN 325 MG PO TABS: 650.0000 mg | ORAL_TABLET | ORAL | Status: DC | PRN

## 2019-07-28 MED ORDER — IOHEXOL 350 MG/ML SOLN
INTRAVENOUS | Status: DC | PRN
  Administered 2019-07-28: 85 mL via INTRA_ARTERIAL

## 2019-07-28 MED ORDER — MIDAZOLAM HCL 2 MG/2ML IJ SOLN
INTRAMUSCULAR | Status: DC | PRN
  Administered 2019-07-28 (×2): 1 mg via INTRAVENOUS

## 2019-07-28 MED ORDER — SODIUM CHLORIDE 0.9 % IV SOLN
INTRAVENOUS | Status: DC
  Administered 2019-07-28: 17:00:00 via INTRAVENOUS

## 2019-07-28 MED ORDER — VERAPAMIL HCL 2.5 MG/ML IV SOLN
INTRAVENOUS | Status: DC | PRN
  Administered 2019-07-28: 10 mL via INTRA_ARTERIAL

## 2019-07-28 MED ORDER — ONDANSETRON HCL 4 MG/2ML IJ SOLN
4.0000 mg | Freq: Four times a day (QID) | INTRAMUSCULAR | Status: DC | PRN
  Administered 2019-07-28: 4 mg via INTRAVENOUS
  Filled 2019-07-28: qty 2

## 2019-07-28 SURGICAL SUPPLY — 13 items
CATH BALLN WEDGE 5F 110CM (CATHETERS) ×1 IMPLANT
CATH OPTITORQUE TIG 4.0 5F (CATHETERS) ×1 IMPLANT
DEVICE RAD COMP TR BAND LRG (VASCULAR PRODUCTS) ×1 IMPLANT
GLIDESHEATH SLEND SS 6F .021 (SHEATH) ×1 IMPLANT
GUIDEWIRE INQWIRE 1.5J.035X260 (WIRE) IMPLANT
INQWIRE 1.5J .035X260CM (WIRE) ×2
KIT HEART LEFT (KITS) ×2 IMPLANT
PACK CARDIAC CATHETERIZATION (CUSTOM PROCEDURE TRAY) ×2 IMPLANT
SHEATH GLIDE SLENDER 4/5FR (SHEATH) ×2 IMPLANT
SHEATH PROBE COVER 6X72 (BAG) ×2 IMPLANT
TRANSDUCER W/STOPCOCK (MISCELLANEOUS) ×2 IMPLANT
TUBING CIL FLEX 10 FLL-RA (TUBING) ×2 IMPLANT
WIRE HI TORQ VERSACORE-J 145CM (WIRE) ×1 IMPLANT

## 2019-07-28 NOTE — Progress Notes (Signed)
PROGRESS NOTE    Rebecca Hanson  JXB:[id number] DOB: 24-Feb-1932 DOA: 07/24/2019 PCP: Ignatius Specking, MD  Brief Narrative:Rebecca Hanson is an 85 y.o. female withhistory of chronic systolic heart failure last EF measured in July 2020 was 45 to 50%, atrial fibrillation, history of protein C deficiency with pulmonary embolism on Coumadin, mitral regurgitation, diabetes mellitus, chronic kidney disease stage III, anemia was brought to the ER at [facility] with complaints of having shortness of breath was given 1 dose of Lasix and discharged back to skilled nursing facility. Since patient had mildly elevated troponin patient was sent back to the ER for further work-up.But given that patient's troponin was mildly increasing from 0.14 - 0.28patient was transferred to [facility]   Assessment & Plan:   Non-ST elevation MI versus demand ischemia -High-sensitivity troponin peak was 1310 -2D echocardiogram showed EF of 45 to 50% with diffuse hypokinesis -Cardiology following, plan for left heart catheterization today, INR is 1.4 -Continue Coreg and statin  Acute on chronic systolic CHF -Echo as noted above -appears close to euvolemic, diuretics on hold -Cardiology following, started on Entresto this admission, plan for cath as noted above  Moderate to severe regurgitation -Noted on 2D echo -Cath today  Acute on chronic anemia -Likely worsened in the setting of hemodilution, improved appropriately with diuresis to 98, monitor with diuresis -Anemia panel with mild Iron defi, will give Iv Iron now and add Po iron at DC  Permanent atrial fibrillation -Currently in A. fib, rate controlled -Continue carvedilol, INR was supratherapeutic on Coumadin, currently being held, resume post cath with coumadin  Stage III chronic kidney disease -Stable, monitor with diuresis  History of DVT/PE, protein C deficiency -On Coumadin at baseline, currently being held, INR is 1.4 now -post cath, restart  coumadin with heparin  Type 2 diabetes mellitus -Stable, continue sliding scale insulin  History of PMR -Continue low-dose prednisone  Possible atypical pneumonia versus CHF -Clinically symptoms more suggestive of CHF, however given elevated procalcitonin level as well agree with doxycycline x5 days total, stop tomorrow  DVT prophylaxis: INR supratherapeutic Code Status: d/w pt agrees to DNR Family Communication: No family at bedside Disposition Plan: Home pending above work-up  Consultants:   Cardiology   Procedures:   Antimicrobials:    Subjective: -feels tired, denies dyspnea or chest pain  Objective: Vitals:   07/27/19 2035 07/27/19 2240 07/28/19 0441 07/28/19 1023  BP: (!) 98/48 125/64 (!) 138/59   Pulse: 92 78 78   Resp: 18  18   Temp: 98 F (36.7 C)  98.2 F (36.8 C)   TempSrc: Oral  Oral   SpO2: 96%  100% 100%  Weight:   71.3 kg   Height:        Intake/Output Summary (Last 24 hours) at 07/28/2019 1026 Last data filed at 07/28/2019 0900 Gross per 24 hour  Intake 340 ml  Output 401 ml  Net -61 ml   Filed Weights   07/26/19 0500 07/27/19 0034 07/28/19 0441  Weight: 71.1 kg 71.1 kg 71.3 kg    Examination:  Gen: Awake, Alert, Oriented X 3, no distress HEENT: PERRLA, Neck supple, no JVD Lungs: few basilar crackles CVS: S1S2/irregular rate and rhythm, systolic murmur Abd: soft, Non tender, non distended, BS present Extremities: No edema Skin: no new rashes Psychiatry: Mood & affect appropriate.     Data Reviewed:   CBC: Recent Labs  Lab 07/24/19 2200 07/25/19 0607 07/26/19 0620 07/27/19 0513 07/28/19 0531  WBC 6.0 6.3 5.7 6.8  7.9  NEUTROABS 4.0  --   --   --   --   HGB 9.0* 8.8* 9.5* 9.2* 9.8*  HCT 28.0* 27.4* 28.8* 28.7* 29.2*  MCV 87.2 87.0 85.2 87.0 85.9  PLT 142* 141* 145* 169 178   Basic Metabolic Panel: Recent Labs  Lab 07/24/19 2200 07/25/19 0607 07/26/19 0620 07/27/19 0513 07/28/19 0531  NA 135 135 135 136 135  K  3.9 4.0 3.7 4.2 4.4  CL 101 103 100 105 101  CO2 24 22 23 22 24   GLUCOSE 220* 98 153* 168* 166*  BUN 25* 27* 21 21 23   CREATININE 1.25* 1.00 0.87 0.98 1.08*  CALCIUM 9.1 8.7* 8.6* 8.6* 8.5*  MG 1.2*  --  2.0  --   --    GFR: Estimated Creatinine Clearance: 33.5 mL/min (A) (by C-G formula based on SCr of 1.08 mg/dL (H)). Liver Function Tests: Recent Labs  Lab 07/24/19 2200  AST 26  ALT 12  ALKPHOS 49  BILITOT 0.6  PROT 5.4*  ALBUMIN 2.7*   No results for input(s): LIPASE, AMYLASE in the last 168 hours. No results for input(s): AMMONIA in the last 168 hours. Coagulation Profile: Recent Labs  Lab 07/24/19 2200 07/25/19 0607 07/26/19 0620 07/27/19 0513 07/28/19 0531  INR 2.7* 2.9* 3.2* 3.8* 1.4*   Cardiac Enzymes: No results for input(s): CKTOTAL, CKMB, CKMBINDEX, TROPONINI in the last 168 hours. BNP (last 3 results) No results for input(s): PROBNP in the last 8760 hours. HbA1C: No results for input(s): HGBA1C in the last 72 hours. CBG: Recent Labs  Lab 07/27/19 0606 07/27/19 1133 07/27/19 1704 07/27/19 2117 07/28/19 0557  GLUCAP 155* 184* 226* 114* 158*   Lipid Profile: Recent Labs    07/26/19 0620  CHOL 90  HDL 27*  LDLCALC 49  TRIG 71  CHOLHDL 3.3   Thyroid Function Tests: No results for input(s): TSH, T4TOTAL, FREET4, T3FREE, THYROIDAB in the last 72 hours. Anemia Panel: Recent Labs    07/27/19 1120  VITAMINB12 407  FOLATE 23.7  FERRITIN 143  TIBC 218*  IRON 34  RETICCTPCT 2.6   Urine analysis: No results found for: COLORURINE, APPEARANCEUR, LABSPEC, PHURINE, GLUCOSEU, HGBUR, BILIRUBINUR, KETONESUR, PROTEINUR, UROBILINOGEN, NITRITE, LEUKOCYTESUR Sepsis Labs: @LABRCNTIP (procalcitonin:4,lacticidven:4)  ) Recent Results (from the past 240 hour(s))  SARS Coronavirus 2 [facility](Hospital order, [facility]) Nasopharyngeal Nasopharyngeal Swab     Status: None   Collection Time: 07/24/19  7:50 PM   Specimen: Nasopharyngeal  Swab  Result Value Ref Range Status   SARS Coronavirus 2 NEGATIVE NEGATIVE Final    Comment: (NOTE) If result is NEGATIVE SARS-CoV-2 target nucleic acids are NOT DETECTED. The SARS-CoV-2 RNA is generally detectable in upper and lower  respiratory specimens during the acute phase of infection. The lowest  concentration of SARS-CoV-2 viral copies this assay can detect is 250  copies / mL. A negative result does not preclude SARS-CoV-2 infection  and should not be used as the sole basis for treatment or other  patient management decisions.  A negative result may occur with  improper specimen collection / handling, submission of specimen other  than nasopharyngeal swab, presence of viral mutation(s) within the  areas targeted by this assay, and inadequate number of viral copies  (<250 copies / mL). A negative result must be combined with clinical  observations, patient history, and epidemiological information. If result is POSITIVE SARS-CoV-2 target nucleic acids are DETECTED. The SARS-CoV-2 RNA is generally detectable in upper and lower  respiratory specimens dur ing the acute phase of infection.  Positive  results are indicative of active infection with SARS-CoV-2.  Clinical  correlation with patient history and other diagnostic information is  necessary to determine patient infection status.  Positive results do  not rule out bacterial infection or co-infection with other viruses. If result is PRESUMPTIVE POSTIVE SARS-CoV-2 nucleic acids MAY BE PRESENT.   A presumptive positive result was obtained on the submitted specimen  and confirmed on repeat testing.  While 2019 novel coronavirus  (SARS-CoV-2) nucleic acids may be present in the submitted sample  additional confirmatory testing may be necessary for epidemiological  and / or clinical management purposes  to differentiate between  SARS-CoV-2 and other Sarbecovirus currently known to infect humans.  If clinically indicated  additional testing with an alternate test  methodology [phone]) is advised. The SARS-CoV-2 RNA is generally  detectable in upper and lower respiratory sp ecimens during the acute  phase of infection. The expected result is Negative. Fact Sheet for Patients:  StrictlyIdeas.no Fact Sheet for Healthcare Providers: BankingDealers.co.za This test is not yet approved or cleared by the [place] FDA and has been authorized for detection and/or diagnosis of SARS-CoV-2 by FDA under an Emergency Use Authorization (EUA).  This EUA will remain in effect (meaning this test can be used) for the duration of the COVID-19 declaration under Section 564(b)(1) of the Act, 21 U.S.C. section 360bbb-3(b)(1), unless the authorization is terminated or revoked sooner. Performed at [facility], [place] [address], [place], [place] [address]          Radiology Studies: Dg Chest 2 View  Result Date: 07/27/2019 CLINICAL DATA:  Congestive heart failure.  Right-sided chest pain. EXAM: CHEST - 2 VIEW COMPARISON:  Chest x-ray dated July 24, 2019. FINDINGS: Unchanged mild cardiomegaly. Essentially resolved interstitial and right basilar opacities. Unchanged scarring/atelectasis in the left mid lung. No pleural effusion or pneumothorax. No acute osseous abnormality. IMPRESSION: 1. Essentially resolved pulmonary edema. Electronically Signed   By: Titus Dubin M.D.   On: 07/27/2019 10:25        Scheduled Meds: . [MAR Hold] atorvastatin  10 mg Oral QPM  . [MAR Hold] carvedilol  12.5 mg Oral BID WC  . [MAR Hold] doxycycline  100 mg Oral Q12H  . [MAR Hold] insulin aspart  0-9 Units Subcutaneous TID WC  . [MAR Hold] predniSONE  5 mg Oral BID WC  . [MAR Hold] sacubitril-valsartan  1 tablet Oral BID  . [MAR Hold] sodium chloride flush  3 mL Intravenous Q12H   Continuous Infusions: . sodium chloride    . sodium chloride 1 mL/kg/hr (07/28/19 0817)      LOS: 4 days    Time spent: 4min    Domenic Polite, MD [facility]   07/28/2019, 10:26 AM

## 2019-07-28 NOTE — Interval H&P Note (Signed)
Cath Lab Visit (complete for each Cath Lab visit)  Clinical Evaluation Leading to the Procedure:   ACS: Yes.    Non-ACS:    Anginal Classification: CCS IV  Anti-ischemic medical therapy: Minimal Therapy (1 class of medications)  Non-Invasive Test Results: No non-invasive testing performed  Prior CABG: No previous CABG      History and Physical Interval Note:  07/28/2019 10:37 AM  Rebecca Hanson  has presented today for surgery, with the diagnosis of nonstemi.  The various methods of treatment have been discussed with the patient and family. After consideration of risks, benefits and other options for treatment, the patient has consented to  Procedure(s): RIGHT/LEFT HEART CATH AND CORONARY ANGIOGRAPHY (N/A) as a surgical intervention.  The patient's history has been reviewed, patient examined, no change in status, stable for surgery.  I have reviewed the patient's chart and labs.  Questions were answered to the patient's satisfaction.     Shelva Majestic

## 2019-07-28 NOTE — Progress Notes (Signed)
ANTICOAGULATION CONSULT NOTE - Follow Up Consult  Pharmacy Consult for heparin Indication: h/o PE w/ protein S deficiency+ r/o cardiac ischemia  Labs: Recent Labs    07/25/19 0944 07/25/19 1038  07/26/19 0620 07/27/19 0513 07/28/19 0531  HGB  --   --    < > 9.5* 9.2* 9.8*  HCT  --   --   --  28.8* 28.7* 29.2*  PLT  --   --   --  145* 169 178  LABPROT  --   --   --  32.6* 36.6* 16.6*  INR  --   --   --  3.2* 3.8* 1.4*  CREATININE  --   --   --  0.87 0.98  --   TROPONINIHS 916* 884*  --   --   --   --    < > = values in this interval not displayed.    Assessment: 85yo female now w/ INR <2, to begin heparin bridge.  Goal of Therapy:  Heparin level 0.3-0.7 units/ml Monitor platelets by anticoagulation protocol: Yes   Plan:  Will begin heparin gtt at 900 units/hr and monitor heparin levels and CBC.  Wynona Neat, PharmD, BCPS  07/28/2019,6:21 AM

## 2019-07-28 NOTE — Progress Notes (Signed)
ANTICOAGULATION CONSULT NOTE - Follow Up Consult  Pharmacy Consult for Heparin Indication: hx PE/protein S deficiency  Allergies  Allergen Reactions  . Benadryl [Diphenhydramine Hcl (Sleep)] Other (See Comments)    "Makes me wild, edgy acting"  . Hand Sanitizer [Ethyl Alcohol (Skin Cleanser)] Swelling    Severe swelling  . Adhesive [Tape] Rash  . Penicillins Hives, Swelling and Rash    Did it involve swelling of the face/tongue/throat, SOB, or low BP? Did it involve sudden or severe rash/hives, skin peeling, or any reaction on the inside of your mouth or nose?  Did you need to seek medical attention at a hospital or doctor's office?  When did it last happen? If all above answers are "NO", may proceed with cephalosporin use..  . Soap Rash  . Streptomycin Sulfate [Streptomycin] Hives, Swelling and Rash    Patient Measurements: Height: 5' (152.4 cm) Weight: 157 lb 3 oz (71.3 kg) IBW/kg (Calculated) : 45.5 Heparin Dosing Weight:    Vital Signs: Temp: 98.2 F (36.8 C) (09/17 1212) Temp Source: Oral (09/17 1212) BP: 112/44 (09/17 1212) Pulse Rate: 71 (09/17 1212)  Labs: Recent Labs    07/26/19 0620 07/27/19 0513 07/28/19 0531  HGB 9.5* 9.2* 9.8*  HCT 28.8* 28.7* 29.2*  PLT 145* 169 178  LABPROT 32.6* 36.6* 16.6*  INR 3.2* 3.8* 1.4*  CREATININE 0.87 0.98 1.08*    Estimated Creatinine Clearance: 33.5 mL/min (A) (by C-G formula based on SCr of 1.08 mg/dL (H)).  Assessment:  Anticoag: Warfarin PTA for hx PE/protein S deficiency. INR= 2.9>>3.2>>3.8>1.4. Anemia noted but stable. Start heparin post cath (sheath out 1130). -PTA last known dose was 2.5mg /d - 9/16: Vit K 5mg  po x 1 and Vit K 2mg  IV - 9/17 Cath: Multivessel CAD. Medical therapy vs intervention.  Goal of Therapy:  Heparin level 0.3-0.7 units/ml Monitor platelets by anticoagulation protocol: Yes   Plan:  Resume IV heparin post-cath at 900 units/hr at 2330 tonight Check HL and CBC in AM   Naama Sappington  S. Alford Highland, PharmD, BCPS Clinical Staff Pharmacist [place], [place] 07/28/2019,1:03 PM

## 2019-07-28 NOTE — Progress Notes (Signed)
Progress Note  Patient Name: Rebecca Hanson Date of Encounter: 07/28/2019  Primary Cardiologist: Kate Sable, MD   Subjective   INR 1.4. Plan for Cath today. No chest pain. On 2L O2 overnight.   Inpatient Medications    Scheduled Meds: . atorvastatin  10 mg Oral QPM  . carvedilol  12.5 mg Oral BID WC  . doxycycline  100 mg Oral Q12H  . insulin aspart  0-9 Units Subcutaneous TID WC  . predniSONE  5 mg Oral BID WC  . sacubitril-valsartan  1 tablet Oral BID  . sodium chloride flush  3 mL Intravenous Q12H   Continuous Infusions: . sodium chloride    . sodium chloride 3 mL/kg/hr (07/28/19 0702)   Followed by  . sodium chloride     PRN Meds: sodium chloride, acetaminophen **OR** acetaminophen, docusate sodium, ondansetron **OR** ondansetron (ZOFRAN) IV, sodium chloride flush   Vital Signs    Vitals:   07/27/19 1729 07/27/19 2035 07/27/19 2240 07/28/19 0441  BP: 102/64 (!) 98/48 125/64 (!) 138/59  Pulse: 63 92 78 78  Resp:  18  18  Temp:  98 F (36.7 C)  98.2 F (36.8 C)  TempSrc:  Oral  Oral  SpO2: 99% 96%  100%  Weight:    71.3 kg  Height:        Intake/Output Summary (Last 24 hours) at 07/28/2019 0743 Last data filed at 07/28/2019 0200 Gross per 24 hour  Intake 460 ml  Output 400 ml  Net 60 ml   Last 3 Weights 07/28/2019 07/27/2019 07/26/2019  Weight (lbs) 157 lb 3 oz 156 lb 12 oz 156 lb 12 oz  Weight (kg) 71.3 kg 71.1 kg 71.1 kg      Telemetry    afib, HR 70-80s - Personally Reviewed  ECG    No new - Personally Reviewed  Physical Exam   GEN: No acute distress.   Neck: No JVD Cardiac: RRR, systolic murmur, rubs, or gallops.  Respiratory: Minimal crackles GI: Soft, nontender, non-distended  MS: No edema; No deformity. Neuro:  Nonfocal  Psych: Normal affect   Labs    High Sensitivity Troponin:   Recent Labs  Lab 07/24/19 2200 07/25/19 0944 07/25/19 1038  TROPONINIHS 1,310* 916* 884*      Chemistry Recent Labs  Lab 07/24/19  2200  07/26/19 0620 07/27/19 0513 07/28/19 0531  NA 135   < > 135 136 135  K 3.9   < > 3.7 4.2 4.4  CL 101   < > 100 105 101  CO2 24   < > 23 22 24   GLUCOSE 220*   < > 153* 168* 166*  BUN 25*   < > 21 21 23   CREATININE 1.25*   < > 0.87 0.98 1.08*  CALCIUM 9.1   < > 8.6* 8.6* 8.5*  PROT 5.4*  --   --   --   --   ALBUMIN 2.7*  --   --   --   --   AST 26  --   --   --   --   ALT 12  --   --   --   --   ALKPHOS 49  --   --   --   --   BILITOT 0.6  --   --   --   --   GFRNONAA 39*   < > >60 53* 47*  GFRAA 45*   < > >60 >60 54*  ANIONGAP 10   < >  12 9 10    < > = values in this interval not displayed.     Hematology Recent Labs  Lab 07/26/19 0620 07/27/19 0513 07/27/19 1120 07/28/19 0531  WBC 5.7 6.8  --  7.9  RBC 3.38* 3.30* 3.60* 3.40*  HGB 9.5* 9.2*  --  9.8*  HCT 28.8* 28.7*  --  29.2*  MCV 85.2 87.0  --  85.9  MCH 28.1 27.9  --  28.8  MCHC 33.0 32.1  --  33.6  RDW 15.5 15.2  --  15.4  PLT 145* 169  --  178    BNP Recent Labs  Lab 07/24/19 2200 07/27/19 0513  BNP 459.9* 176.8*     DDimer No results for input(s): DDIMER in the last 168 hours.   Radiology    Dg Chest 2 View  Result Date: 07/27/2019 CLINICAL DATA:  Congestive heart failure.  Right-sided chest pain. EXAM: CHEST - 2 VIEW COMPARISON:  Chest x-ray dated July 24, 2019. FINDINGS: Unchanged mild cardiomegaly. Essentially resolved interstitial and right basilar opacities. Unchanged scarring/atelectasis in the left mid lung. No pleural effusion or pneumothorax. No acute osseous abnormality. IMPRESSION: 1. Essentially resolved pulmonary edema. Electronically Signed   By: Obie DredgeWilliam T Derry M.D.   On: 07/27/2019 10:25    Cardiac Studies   Cath today  Echo 07/25/19 1. The left ventricle has mild-moderately reduced systolic function, with an ejection fraction of 40-45%. The cavity size was normal. Left ventricular diastolic Doppler parameters are indeterminate. Left ventricular diffuse hypokinesis.  2. Normal RV size with mildly decreased systolic function. 3. There is mild mitral annular calcification present. Mitral valve regurgitation is mild to moderate by color flow Doppler. No evidence of mitral valve stenosis. 4. The aortic valve is tricuspid. Mild calcification of the aortic valve. No stenosis of the aortic valve. 5. The aortic root is normal in size and structure. 6. Left atrial size was severely dilated. 7. Right atrial size was mildly dilated. 8. The inferior vena cava was dilated in size with <50% respiratory variability. PA systolic pressure 44 mmHg.  06-11-16 Nuc stress test  Accentuation in resting ST segment depression noted in the inferolateral leads. Atrial fibrillation present throughout.  Small, mild intensity, apical anterior defect that is partially reversible. This could be related to variable breast attenuation artifact, although a minor region of ischemia is also possible.  This is a high risk study based on reduced LVEF, no large ischemic territories identified however.  Nuclear stress EF: 30%.  Patient Profile     85 y.o. female w/ h/o afib (chronic), chronic systolic HF with EF 30-35%, mild MR/TR, h/o PE and protein S deficiency, HTN, DM2, but no prior h/o CAD,who is being seen for the evaluation ofelevated/abnormal troponin.  Assessment & Plan    Elevated Troponin/ ?NSTEMI vs demand ischemia in setting of PNA and anemia - [facility] troponin. troponin 0.05 > 0.14 > 0.28 - Initial HSTroponinhere1310>>916>>884 - Patient has no history of CAD - So far patient has been symptom free with no CP. Patient had [facility] but improved with IV Lasix - EF 2 months ago was 45-50%and echo this admit 40-45% with diffuse HK - With further elevation of Troponin, worsening LV dysfunction with hx of perfusion defect on nuc in the past and significant MR, recommend right and left heart cath to  define coronary anatomy and assess MR. -Coumadin on hold in anticipation of R and L heart cath  once INR<2. Vitamin K was given yesterday.  - INR today 1.4  -IV Heparin gtt per pharmacy - Plan for Cath today  Risks and benefits of cardiac catheterization have been discussed with the patient.  These include bleeding, infection, kidney damage, stroke, heart attack, death.  The patient understands these risks and is willing to proceed.  MR - noted to be moderate to severe on recent TEEbut mild to moderate by echo this admit with moderate pulmonary HTN -assess further at cath  Acute on Chronic systolic Heart Failure  - Patient initially presented to [facility] ER for dyspnea. She received IV lasix in the ER which seemed to improve SOB. Patient is on Lasix 40 mg daily at home.  - BNP 459 on admission - Patient is on 2L O2 PRN. She denies home O2 at baseline. - she put out -1.5 L since admission - CXR yesterday showed resolved pulmonary edema; also given worse creatinine>> continue to hold diuretics - Creatinine 1.00> 0.87> 1.08 - Continue Coreg 12.5 mg daily and Entresto as BP allows-->per Dr. Sharene Skeans last note she was on Entresto 24-26mg  BID and did not tolerate higher doses due to hypotension but there is some question as to whether she was taking it at SNF. Sherryll Burger and Coreg were held yesterday AM for hypotension.  - BP this AM 138/59 >> Can likely give meds  Permanent Afib - Rate controlled on coreg - coumadin on hold for cath.  -INR 1.4 today >>Start Heparin per pharmacy in preparation for cath today - K+ 4.4 >> on Potassium 20 mEq daily - Mag 2.0   Acute on chronic Anemia - Hgb in 2018 was 11. This admission was 8.8and trending upward to 9.8 today. - Patient has not reported any bleeding - Ordered a hemoccult >> not collected - Stable  Hyperlipidemia - Atorvastatin 10 mg home dose -LDL 49 this admit  Hypertension - Coreg home med - Stable  CKD stage III -  Creatinine improved1.00 > 0.86 > 1.08  - Avoid nephrotoxic agents  H/o DVT and PE and protein C deficiency - A/C Coumadin >> transition to IV heparin in anticipation of cath  PNA - Per recent CXR - abx per IM  DM2 - per IM - A1C 7.6   For questions or updates, please contact [facility] Please consult [email or web address] for contact info under        Signed, Jonmarc Bodkin David Stall, PA-C  07/28/2019, 7:43 AM

## 2019-07-28 NOTE — TOC Progression Note (Signed)
Transition of Care [facility]) - Progression Note    Patient Details  Name: Rebecca Hanson MRN: [id number] Date of Birth: May 15, 1932  [facility] CM/SW Contact  Zenon Mayo, RN Phone Number: 07/28/2019, 3:33 PM  Clinical Narrative:    NCM spoke with Mardene Celeste at [facility], she will have a private room for patient tomorrow 9/18.  NCM informed Radiation protection practitioner RN to put in rapid covid test, will have to have this back before patient can be discharged.   Expected Discharge Plan: Skilled Nursing Facility Barriers to Discharge: No Barriers Identified  Expected Discharge Plan and Services Expected Discharge Plan: [place] In-house Referral: NA Discharge Planning Services: CM Consult Post Acute Care Choice: [place] Living arrangements for the past 2 months: Skilled Nursing Facility(prior to SNF from home)                 DME Arranged: (NA)         HH Arranged: NA           Social Determinants of Health (SDOH) Interventions    Readmission Risk Interventions No flowsheet data found.

## 2019-07-29 ENCOUNTER — Other Ambulatory Visit: Payer: Self-pay

## 2019-07-29 ENCOUNTER — Telehealth: Payer: Self-pay | Admitting: *Deleted

## 2019-07-29 DIAGNOSIS — R41841 Cognitive communication deficit: Secondary | ICD-10-CM | POA: Diagnosis not present

## 2019-07-29 DIAGNOSIS — I4821 Permanent atrial fibrillation: Secondary | ICD-10-CM | POA: Diagnosis not present

## 2019-07-29 DIAGNOSIS — J189 Pneumonia, unspecified organism: Secondary | ICD-10-CM | POA: Diagnosis not present

## 2019-07-29 DIAGNOSIS — R7989 Other specified abnormal findings of blood chemistry: Secondary | ICD-10-CM | POA: Diagnosis not present

## 2019-07-29 DIAGNOSIS — E119 Type 2 diabetes mellitus without complications: Secondary | ICD-10-CM | POA: Diagnosis not present

## 2019-07-29 DIAGNOSIS — D649 Anemia, unspecified: Secondary | ICD-10-CM | POA: Diagnosis not present

## 2019-07-29 DIAGNOSIS — I340 Nonrheumatic mitral (valve) insufficiency: Secondary | ICD-10-CM | POA: Diagnosis not present

## 2019-07-29 DIAGNOSIS — R5381 Other malaise: Secondary | ICD-10-CM | POA: Diagnosis not present

## 2019-07-29 DIAGNOSIS — J9611 Chronic respiratory failure with hypoxia: Secondary | ICD-10-CM | POA: Diagnosis not present

## 2019-07-29 DIAGNOSIS — E1165 Type 2 diabetes mellitus with hyperglycemia: Secondary | ICD-10-CM | POA: Diagnosis not present

## 2019-07-29 DIAGNOSIS — I420 Dilated cardiomyopathy: Secondary | ICD-10-CM | POA: Diagnosis not present

## 2019-07-29 DIAGNOSIS — Z7401 Bed confinement status: Secondary | ICD-10-CM | POA: Diagnosis not present

## 2019-07-29 DIAGNOSIS — I5023 Acute on chronic systolic (congestive) heart failure: Secondary | ICD-10-CM | POA: Diagnosis not present

## 2019-07-29 DIAGNOSIS — R2689 Other abnormalities of gait and mobility: Secondary | ICD-10-CM | POA: Diagnosis not present

## 2019-07-29 DIAGNOSIS — M6281 Muscle weakness (generalized): Secondary | ICD-10-CM | POA: Diagnosis not present

## 2019-07-29 DIAGNOSIS — N183 Chronic kidney disease, stage 3 unspecified: Secondary | ICD-10-CM | POA: Diagnosis not present

## 2019-07-29 DIAGNOSIS — R0602 Shortness of breath: Secondary | ICD-10-CM | POA: Diagnosis not present

## 2019-07-29 DIAGNOSIS — I214 Non-ST elevation (NSTEMI) myocardial infarction: Secondary | ICD-10-CM | POA: Diagnosis not present

## 2019-07-29 DIAGNOSIS — Z66 Do not resuscitate: Secondary | ICD-10-CM | POA: Diagnosis not present

## 2019-07-29 DIAGNOSIS — J441 Chronic obstructive pulmonary disease with (acute) exacerbation: Secondary | ICD-10-CM | POA: Diagnosis not present

## 2019-07-29 DIAGNOSIS — I130 Hypertensive heart and chronic kidney disease with heart failure and stage 1 through stage 4 chronic kidney disease, or unspecified chronic kidney disease: Secondary | ICD-10-CM | POA: Diagnosis not present

## 2019-07-29 DIAGNOSIS — I10 Essential (primary) hypertension: Secondary | ICD-10-CM | POA: Diagnosis not present

## 2019-07-29 DIAGNOSIS — M353 Polymyalgia rheumatica: Secondary | ICD-10-CM | POA: Diagnosis not present

## 2019-07-29 DIAGNOSIS — Z23 Encounter for immunization: Secondary | ICD-10-CM | POA: Diagnosis not present

## 2019-07-29 DIAGNOSIS — D509 Iron deficiency anemia, unspecified: Secondary | ICD-10-CM | POA: Diagnosis not present

## 2019-07-29 DIAGNOSIS — M2550 Pain in unspecified joint: Secondary | ICD-10-CM | POA: Diagnosis not present

## 2019-07-29 DIAGNOSIS — D6859 Other primary thrombophilia: Secondary | ICD-10-CM | POA: Diagnosis not present

## 2019-07-29 DIAGNOSIS — Z48812 Encounter for surgical aftercare following surgery on the circulatory system: Secondary | ICD-10-CM | POA: Diagnosis not present

## 2019-07-29 DIAGNOSIS — Z20828 Contact with and (suspected) exposure to other viral communicable diseases: Secondary | ICD-10-CM | POA: Diagnosis not present

## 2019-07-29 DIAGNOSIS — F329 Major depressive disorder, single episode, unspecified: Secondary | ICD-10-CM | POA: Diagnosis not present

## 2019-07-29 DIAGNOSIS — R1110 Vomiting, unspecified: Secondary | ICD-10-CM | POA: Diagnosis not present

## 2019-07-29 DIAGNOSIS — I2510 Atherosclerotic heart disease of native coronary artery without angina pectoris: Secondary | ICD-10-CM | POA: Diagnosis not present

## 2019-07-29 LAB — GLUCOSE, CAPILLARY
Glucose-Capillary: 142 mg/dL — ABNORMAL HIGH (ref 70–99)
Glucose-Capillary: 167 mg/dL — ABNORMAL HIGH (ref 70–99)

## 2019-07-29 LAB — CBC
HCT: 29.9 % — ABNORMAL LOW (ref 36.0–46.0)
Hemoglobin: 9.3 g/dL — ABNORMAL LOW (ref 12.0–15.0)
MCH: 27.4 pg (ref 26.0–34.0)
MCHC: 31.1 g/dL (ref 30.0–36.0)
MCV: 88.2 fL (ref 80.0–100.0)
Platelets: 191 10*3/uL (ref 150–400)
RBC: 3.39 MIL/uL — ABNORMAL LOW (ref 3.87–5.11)
RDW: 15.7 % — ABNORMAL HIGH (ref 11.5–15.5)
WBC: 7.4 10*3/uL (ref 4.0–10.5)
nRBC: 0 % (ref 0.0–0.2)

## 2019-07-29 LAB — BASIC METABOLIC PANEL
Anion gap: 7 (ref 5–15)
BUN: 23 mg/dL (ref 8–23)
CO2: 24 mmol/L (ref 22–32)
Calcium: 8.8 mg/dL — ABNORMAL LOW (ref 8.9–10.3)
Chloride: 106 mmol/L (ref 98–111)
Creatinine, Ser: 1.06 mg/dL — ABNORMAL HIGH (ref 0.44–1.00)
GFR calc Af Amer: 55 mL/min — ABNORMAL LOW (ref >60)
GFR calc non Af Amer: 48 mL/min — ABNORMAL LOW (ref >60)
Glucose, Bld: 148 mg/dL — ABNORMAL HIGH (ref 70–99)
Potassium: 4.8 mmol/L (ref 3.5–5.1)
Sodium: 137 mmol/L (ref 135–145)

## 2019-07-29 LAB — PROTIME-INR
INR: 1.2 (ref 0.8–1.2)
Prothrombin Time: 15.2 seconds (ref 11.4–15.2)

## 2019-07-29 LAB — HEPARIN LEVEL (UNFRACTIONATED): Heparin Unfractionated: <0.1 IU/mL — ABNORMAL LOW (ref 0.30–0.70)

## 2019-07-29 LAB — SARS CORONAVIRUS 2 (TAT 6-24 HRS): SARS Coronavirus 2: NEGATIVE

## 2019-07-29 MED ORDER — POTASSIUM CHLORIDE CRYS ER 10 MEQ PO TBCR
10.0000 meq | EXTENDED_RELEASE_TABLET | Freq: Every day | ORAL | Status: DC
  Administered 2019-07-29: 10 meq via ORAL
  Filled 2019-07-29: qty 1

## 2019-07-29 MED ORDER — ENOXAPARIN SODIUM 100 MG/ML ~~LOC~~ SOLN
100.0000 mg | SUBCUTANEOUS | Status: DC
  Administered 2019-07-29: 100 mg via SUBCUTANEOUS
  Filled 2019-07-29: qty 1

## 2019-07-29 MED ORDER — WARFARIN SODIUM 4 MG PO TABS: 4.0000 mg | ORAL_TABLET | Freq: Every day | ORAL | 0 refills | Status: DC

## 2019-07-29 MED ORDER — FUROSEMIDE 20 MG PO TABS
20.0000 mg | ORAL_TABLET | Freq: Every day | ORAL | Status: DC
  Administered 2019-07-29: 20 mg via ORAL
  Filled 2019-07-29: qty 1

## 2019-07-29 MED ORDER — FUROSEMIDE 20 MG PO TABS: 20.0000 mg | ORAL_TABLET | Freq: Every day | ORAL | Status: AC

## 2019-07-29 MED ORDER — ENOXAPARIN (LOVENOX) PATIENT EDUCATION KIT
PACK | Freq: Once | Status: AC
  Administered 2019-07-29: 13:00:00
  Filled 2019-07-29: qty 1

## 2019-07-29 MED ORDER — POTASSIUM CHLORIDE CRYS ER 10 MEQ PO TBCR: 10.0000 meq | EXTENDED_RELEASE_TABLET | Freq: Every day | ORAL | Status: AC

## 2019-07-29 MED ORDER — DOXYCYCLINE HYCLATE 100 MG PO TABS: 100.0000 mg | ORAL_TABLET | Freq: Two times a day (BID) | ORAL | 0 refills | Status: AC

## 2019-07-29 MED ORDER — WARFARIN - PHARMACIST DOSING INPATIENT: Freq: Every day | Status: DC

## 2019-07-29 MED ORDER — ENOXAPARIN SODIUM 100 MG/ML ~~LOC~~ SOLN: 100.0000 mg | SUBCUTANEOUS | Status: DC

## 2019-07-29 MED ORDER — WARFARIN SODIUM 3 MG PO TABS: 6.0000 mg | ORAL_TABLET | Freq: Once | ORAL | Status: DC

## 2019-07-29 NOTE — Telephone Encounter (Signed)
Referral done

## 2019-07-29 NOTE — Progress Notes (Signed)
ANTICOAGULATION CONSULT NOTE - Initial Consult  Pharmacy Consult for Coumadin Indication: h/o PE w/ protein S deficiency  Allergies  Allergen Reactions  . Benadryl [Diphenhydramine Hcl (Sleep)] Other (See Comments)    "Makes me wild, edgy acting"  . Hand Sanitizer [Ethyl Alcohol (Skin Cleanser)] Swelling    Severe swelling  . Adhesive [Tape] Rash  . Penicillins Hives, Swelling and Rash    Did it involve swelling of the face/tongue/throat, SOB, or low BP? Did it involve sudden or severe rash/hives, skin peeling, or any reaction on the inside of your mouth or nose?  Did you need to seek medical attention at a hospital or doctor's office?  When did it last happen? If all above answers are "NO", may proceed with cephalosporin use..  . Soap Rash  . Streptomycin Sulfate [Streptomycin] Hives, Swelling and Rash    Patient Measurements: Height: 5' (152.4 cm) Weight: 155 lb 10.3 oz (70.6 kg) IBW/kg (Calculated) : 45.5  Vital Signs: Temp: 98.2 F (36.8 C) (09/18 0421) Temp Source: Oral (09/18 0421) BP: 125/76 (09/18 0421) Pulse Rate: 92 (09/18 0421)  Labs: Recent Labs    07/27/19 0513 07/28/19 0531 07/28/19 1113 07/28/19 1117 07/28/19 1423 07/29/19 0417  HGB 9.2* 9.8* 9.5*  9.5* 9.5*  --  9.3*  HCT 28.7* 29.2* 28.0*  28.0* 28.0*  --  29.9*  PLT 169 178  --   --   --  191  LABPROT 36.6* 16.6*  --   --   --  15.2  INR 3.8* 1.4*  --   --   --  1.2  HEPARINUNFRC  --   --   --   --  <0.10*  --   CREATININE 0.98 1.08*  --   --   --  1.06*    Estimated Creatinine Clearance: 34 mL/min (A) (by C-G formula based on SCr of 1.06 mg/dL (H)).   Medical History: Past Medical History:  Diagnosis Date  . CHF (congestive heart failure) (HCC)   . Diabetes mellitus without complication (HCC)   . Dyslipidemia     Medications:  Medications Prior to Admission  Medication Sig Dispense Refill Last Dose  . acetaminophen (TYLENOL) 325 MG tablet Take 650 mg by mouth every 4 (four)  hours as needed (for pain).   07/12/2019  . atorvastatin (LIPITOR) 10 MG tablet Take 10 mg by mouth every evening.   07/23/2019  . carvedilol (COREG) 12.5 MG tablet Take 12.5 mg by mouth 2 (two) times daily.    07/24/2019 at 0900  . cetirizine (ALL DAY ALLERGY) 10 MG tablet Take 10 mg by mouth daily.    07/24/2019 at Unknown time  . docusate sodium (COLACE) 100 MG capsule Take 100 mg by mouth 2 (two) times daily as needed for mild constipation.    unk  . furosemide (LASIX) 40 MG tablet Take 40 mg by mouth daily.    07/24/2019 at Unknown time  . glimepiride (AMARYL) 1 MG tablet Take 1 mg by mouth daily with breakfast.   07/24/2019 at Unknown time  . metFORMIN (GLUCOPHAGE) 500 MG tablet Take 500 mg by mouth 2 (two) times daily.    07/23/2019  . Multiple Vitamin (DAILY-VITE) TABS Take 1 tablet by mouth daily.   07/24/2019 at Unknown time  . potassium chloride (K-DUR) 20 MEQ tablet Take 1 tablet (20 mEq total) by mouth daily. 30 tablet 6 07/24/2019 at Unknown time  . predniSONE (DELTASONE) 5 MG tablet Take 5 mg by mouth 2 (two) times daily  with a meal.   07/23/2019  . [EXPIRED] warfarin (COUMADIN) 4 MG tablet Take 4 mg by mouth daily. Recheck INR 07/27/2019   07/23/2019 at 1700  . doxycycline (VIBRA-TABS) 100 MG tablet Take 100 mg by mouth 2 (two) times daily.   07/23/2019  . sacubitril-valsartan (ENTRESTO) 24-26 MG Take 1 tablet by mouth 2 (two) times daily. (Patient not taking: Reported on 07/25/2019) 60 tablet 6 Not Taking at Unknown time   Scheduled:  . aspirin  81 mg Oral Daily  . atorvastatin  10 mg Oral QPM  . carvedilol  12.5 mg Oral BID WC  . doxycycline  100 mg Oral Q12H  . insulin aspart  0-9 Units Subcutaneous TID WC  . predniSONE  5 mg Oral BID WC  . sacubitril-valsartan  1 tablet Oral BID  . sodium chloride flush  3 mL Intravenous Q12H  . sodium chloride flush  3 mL Intravenous Q12H   Infusions:  . sodium chloride    . heparin 900 Units/hr (07/29/19 0001)    Assessment: 85yo female now  post-cath and cleared to resume Coumadin, currently on heparin.  Goal of Therapy:  INR 2-3   Plan:  Will give Coumadin 6mg  po x1 today and monitor INR for dose adjustments.  Wynona Neat, PharmD, BCPS  07/29/2019,7:18 AM

## 2019-07-29 NOTE — Telephone Encounter (Signed)
-----   Message from Helen, PA-C sent at 07/29/2019 10:30 AM EDT ----- Regarding: GI new patient appointment post hospital f/u Hi, I am Cadence, a PA with general cardiology. Dayna called earlier regarding this patient for GI f/u. Can you please arrange hospital f/u with GI for Anemia for a new patient? She lives in [place].   Thanks!

## 2019-07-29 NOTE — Plan of Care (Signed)
  Problem: Elimination: Goal: Will not experience complications related to urinary retention Outcome: Completed/Met   Problem: Pain Managment: Goal: General experience of comfort will improve Outcome: Completed/Met

## 2019-07-29 NOTE — Progress Notes (Signed)
Physical Therapy Treatment Patient Details Name: Rebecca Hanson MRN: [id number] DOB: July 14, 1932 Today's Date: 07/29/2019    History of Present Illness 85yo female with recent bout of SOB, taken to [facility] and then discharged back to SNF. She then returned to [facility] and was ultimately transferred to [facility] due to elevated troponins. Covid test negative. PMH DM, CHF with EJF 45-50%, A-fib, protein C deficiency with hx of PE on coumadin, mitral regurgitation, CKD    PT Comments    Pt supine on arrival very willing to participate and attempt mobility. Pt with purewick present but urine soaked linens on arrival with pt unaware. Pt with improved mobility, tolerance for transfers, and HEp today with pt encouraged to continue mobility with nursing staff to get to [facility] and chair daily. Pt with generalized aching and pain but able to tolerate mobility without significant change in pain. Will continue to follow to maximize function.  Nursing aware of pt status and linen change.  SpO2 89% on RA on arrival, returned 2L with SpO2 96%    Follow Up Recommendations  SNF;Supervision/Assistance - 24 hour     Equipment Recommendations  None recommended by PT    Recommendations for Other Services       Precautions / Restrictions Precautions Precautions: Fall    Mobility  Bed Mobility Overal bed mobility: Needs Assistance Bed Mobility: Supine to Sit     Supine to sit: HOB elevated;Min assist     General bed mobility comments: HOB 35 degrees with use of rail, increased time with pt able to pivot to side of bed with ability to sit up without assist, assist of pad to scoot fully to EoB  Transfers Overall transfer level: Needs assistance   Transfers: Sit to/from Stand;Stand Pivot Transfers Sit to Stand: Min assist Stand pivot transfers: Min assist       General transfer comment: min assist to rise from bed and pivot with RW to recliner with slow sequential steps, cues for safety and positioning. Pt  unable to tolerate looking up  Ambulation/Gait                 Stairs             Wheelchair Mobility    Modified Rankin (Stroke Patients Only)       Balance Overall balance assessment: Needs assistance   Sitting balance-Leahy Scale: Fair     Standing balance support: Bilateral upper extremity supported Standing balance-Leahy Scale: Poor                              Cognition Arousal/Alertness: Awake/alert Behavior During Therapy: WFL for tasks assessed/performed Overall Cognitive Status: Impaired/Different from baseline Area of Impairment: Safety/judgement                         Safety/Judgement: Decreased awareness of deficits;Decreased awareness of safety            Exercises General Exercises - Lower Extremity Long Arc Quad: AROM;Both;Seated;20 reps Hip ABduction/ADduction: AAROM;20 reps;Seated;Both Hip Flexion/Marching: AROM;20 reps;Both;Seated    General Comments        Pertinent Vitals/Pain Pain Assessment: 0-10 Pain Score: 4  Pain Descriptors / Indicators: Aching Pain Intervention(s): Limited activity within patient's tolerance;Repositioned;Monitored during session    Home Living                      Prior Function  PT Goals (current goals can now be found in the care plan section) Progress towards PT goals: Progressing toward goals    Frequency    Min 2X/week      PT Plan Current plan remains appropriate    Co-evaluation              AM-PAC PT "6 Clicks" Mobility   Outcome Measure  Help needed turning from your back to your side while in a flat bed without using bedrails?: A Little Help needed moving from lying on your back to sitting on the side of a flat bed without using bedrails?: A Little Help needed moving to and from a bed to a chair (including a wheelchair)?: A Little Help needed standing up from a chair using your arms (e.g., wheelchair or bedside chair)?: A  Little Help needed to walk in hospital room?: A Lot Help needed climbing 3-5 steps with a railing? : Total 6 Click Score: 15    End of Session Equipment Utilized During Treatment: Gait belt;Oxygen Activity Tolerance: Patient tolerated treatment well Patient left: in chair;with call bell/phone within reach;with chair alarm set Nurse Communication: Mobility status PT Visit Diagnosis: Muscle weakness (generalized) (M62.81);Difficulty in walking, not elsewhere classified (R26.2);Other abnormalities of gait and mobility (R26.89)     Time: 3149-7026 PT Time Calculation (min) (ACUTE ONLY): 25 min  Charges:  $Therapeutic Exercise: 8-22 mins $Therapeutic Activity: 8-22 mins                     Rebecca Hanson, PT Acute Rehabilitation Services Pager: [phone] Office: [phone]    Rebecca Hanson 07/29/2019, 9:09 AM

## 2019-07-29 NOTE — Progress Notes (Signed)
Progress Note  Patient Name: Rebecca Hanson Date of Encounter: 07/29/2019  Primary Cardiologist: Prentice Docker, MD   Subjective   Patient is feeling well this morning. Denies chest pain or sob. No on O2 currently. Cath site is clean and dry. Likely discharge today to SNF.   Inpatient Medications    Scheduled Meds:  aspirin  81 mg Oral Daily   atorvastatin  10 mg Oral QPM   carvedilol  12.5 mg Oral BID WC   doxycycline  100 mg Oral Q12H   insulin aspart  0-9 Units Subcutaneous TID WC   predniSONE  5 mg Oral BID WC   sacubitril-valsartan  1 tablet Oral BID   sodium chloride flush  3 mL Intravenous Q12H   sodium chloride flush  3 mL Intravenous Q12H   warfarin  6 mg Oral ONCE-1800   Warfarin - Pharmacist Dosing Inpatient   Does not apply q1800   Continuous Infusions:  sodium chloride     heparin 900 Units/hr (07/29/19 0001)   PRN Meds: sodium chloride, acetaminophen, diazepam, docusate sodium, ondansetron (ZOFRAN) IV, ondansetron **OR** [DISCONTINUED] ondansetron (ZOFRAN) IV, sodium chloride flush   Vital Signs    Vitals:   07/28/19 2008 07/28/19 2140 07/29/19 0421 07/29/19 0500  BP: (!) 98/57 (!) 121/53 125/76   Pulse: 69 62 92   Resp:   20   Temp:   98.2 F (36.8 C)   TempSrc:   Oral   SpO2:   93%   Weight:    70.6 kg  Height:        Intake/Output Summary (Last 24 hours) at 07/29/2019 0738 Last data filed at 07/29/2019 0500 Gross per 24 hour  Intake 957.71 ml  Output 1251 ml  Net -293.29 ml   Last 3 Weights 07/29/2019 07/28/2019 07/27/2019  Weight (lbs) 155 lb 10.3 oz 157 lb 3 oz 156 lb 12 oz  Weight (kg) 70.6 kg 71.3 kg 71.1 kg      Telemetry    Afib, rates 70-80s, occasional PVC - Personally Reviewed  ECG     Afib, 76 bpm, no acute changes- Personally Reviewed  Physical Exam   GEN: No acute distress.   Neck: No JVD Cardiac: Irreg Irreg, no murmurs, rubs, or gallops.  Respiratory: crackles bilaterally. GI: Soft, nontender,  non-distended  MS: No edema; No deformity; right radial cath site is clean and dry  Neuro:  Nonfocal  Psych: Normal affect   Labs    High Sensitivity Troponin:   Recent Labs  Lab 07/24/19 2200 07/25/19 0944 07/25/19 1038  TROPONINIHS 1,310* 916* 884*      Chemistry Recent Labs  Lab 07/24/19 2200  07/27/19 0513 07/28/19 0531 07/28/19 1113 07/28/19 1117 07/29/19 0417  NA 135   < > 136 135 139   139 138 137  K 3.9   < > 4.2 4.4 4.3   4.2 4.2 4.8  CL 101   < > 105 101  --   --  106  CO2 24   < > 22 24  --   --  24  GLUCOSE 220*   < > 168* 166*  --   --  148*  BUN 25*   < > 21 23  --   --  23  CREATININE 1.25*   < > 0.98 1.08*  --   --  1.06*  CALCIUM 9.1   < > 8.6* 8.5*  --   --  8.8*  PROT 5.4*  --   --   --   --   --   --  ALBUMIN 2.7*  --   --   --   --   --   --   AST 26  --   --   --   --   --   --   ALT 12  --   --   --   --   --   --   ALKPHOS 49  --   --   --   --   --   --   BILITOT 0.6  --   --   --   --   --   --   GFRNONAA 39*   < > 53* 47*  --   --  48*  GFRAA 45*   < > >60 54*  --   --  55*  ANIONGAP 10   < > 9 10  --   --  7   < > = values in this interval not displayed.     Hematology Recent Labs  Lab 07/27/19 0513 07/27/19 1120 07/28/19 0531 07/28/19 1113 07/28/19 1117 07/29/19 0417  WBC 6.8  --  7.9  --   --  7.4  RBC 3.30* 3.60* 3.40*  --   --  3.39*  HGB 9.2*  --  9.8* 9.5*   9.5* 9.5* 9.3*  HCT 28.7*  --  29.2* 28.0*   28.0* 28.0* 29.9*  MCV 87.0  --  85.9  --   --  88.2  MCH 27.9  --  28.8  --   --  27.4  MCHC 32.1  --  33.6  --   --  31.1  RDW 15.2  --  15.4  --   --  15.7*  PLT 169  --  178  --   --  191    BNP Recent Labs  Lab 07/24/19 2200 07/27/19 0513  BNP 459.9* 176.8*     DDimer No results for input(s): DDIMER in the last 168 hours.   Radiology    Dg Chest 2 View  Result Date: 07/27/2019 CLINICAL DATA:  Congestive heart failure.  Right-sided chest pain. EXAM: CHEST - 2 VIEW COMPARISON:  Chest x-ray dated  July 24, 2019. FINDINGS: Unchanged mild cardiomegaly. Essentially resolved interstitial and right basilar opacities. Unchanged scarring/atelectasis in the left mid lung. No pleural effusion or pneumothorax. No acute osseous abnormality. IMPRESSION: 1. Essentially resolved pulmonary edema. Electronically Signed   By: Obie DredgeWilliam T Derry M.D.   On: 07/27/2019 10:25    Cardiac Studies   Cath 07/29/19  Ost LAD to Prox LAD lesion is 50% stenosed.  Prox LAD to Mid LAD lesion is 80% stenosed.  1st Sept lesion is 20% stenosed.  Prox Cx lesion is 80% stenosed.   Multivessel CAD with 50% ostial LAD stenosis followed by diffuse 80% proximal to mid LAD stenoses; 80% proximal circumflex stenoses, and a dominant RCA which gives rise to a conus branch that has an 85% ostial conus branch stenosis, and otherwise no significant obstruction in the RCA which supplies a large PDA and PLA vessel.  Mild right heart pressure elevation with PA systolic pressure at 39 mm.  RECOMMENDATION: Angiograms will be reviewed with colleagues.  With her age of 85 years, consider an initial increased medical therapy trial and if fails then discuss possible intervention which should be done from the groin approach due to the very small caliber radial vessel.  Patient is on chronic warfarin therapy for her permanent atrial fibrillation which has been held for the procedure and  will be restarted on heparin this evening following the catheterization.  Echo 07/25/19 1. The left ventricle has mild-moderately reduced systolic function, with an ejection fraction of 40-45%. The cavity size was normal. Left ventricular diastolic Doppler parameters are indeterminate. Left ventricular diffuse hypokinesis. 2. Normal RV size with mildly decreased systolic function. 3. There is mild mitral annular calcification present. Mitral valve regurgitation is mild to moderate by color flow Doppler. No evidence of mitral valve stenosis. 4. The  aortic valve is tricuspid. Mild calcification of the aortic valve. No stenosis of the aortic valve. 5. The aortic root is normal in size and structure. 6. Left atrial size was severely dilated. 7. Right atrial size was mildly dilated. 8. The inferior vena cava was dilated in size with <50% respiratory variability. PA systolic pressure 44 mmHg.  06-11-16 Nuc stress test  Accentuation in resting ST segment depression noted in the inferolateral leads. Atrial fibrillation present throughout.  Small, mild intensity, apical anterior defect that is partially reversible. This could be related to variable breast attenuation artifact, although a minor region of ischemia is also possible.  This is a high risk study based on reduced LVEF, no large ischemic territories identified however.  Nuclear stress EF: 30%.  Patient Profile     85 y.o. female  w/ h/o afib (chronic), chronic systolic HF with EF 62-83%, mild MR/TR, h/o PE and protein S deficiency, HTN, DM2, but no prior h/o CAD,who is being seen for the evaluation ofelevated/abnormal troponin.  Assessment & Plan    CAD/Elevated Troponin/ ?NSTEMI vs demand ischemia in setting of PNA and anemia - Patient was transferred from [place] for elevated troponin. troponin 0.05 > 0.14 > 0.28 - Initial HSTroponinhere1310>>916>>884 - So far patient has been symptom free with no CP.  - EF 2 months ago was 45-50%and echo this admit 40-45% with diffuse HK - Cath yesterday showed multivessel CAD with 50% ostial LAD stenosis followed by diffuse 80% proximal to mid LAD stenoses; 80% proximal circumflex stenoses, and a dominant RCA which gives rise to a conus branch that has an 85% ostial conus branch stenosis, and otherwise no significant obstruction in the RCA which supplies a large PDA and PLA vessel.  - Recommendation of continued medical therapy given age >> Will defer ASA for now.  - Patient needs f/u with GI for Anemia f/u outpatient - Cath  site is clean and dry - On IV heparin>>restart warfarin per pharmacy - Continue statin, Coreg, Entresto  MR - Noted to be moderate to severe on recent TEEbut mild to moderate by echo this admit with moderate pulmonary HTN  Acute on Chronic systolic Heart Failure  - Patient initially presented to [facility] ER for dyspnea. She received IV lasix in the ER which seemed to improve SOB. Patient is on Lasix 40 mg daily at home.  - BNP 459 on admission - Patient is on 2L O2PRN. She denies home O2 at baseline. - she put out-1.8 L since admission - CXR showed resolved pulmonary edema - Creatinine  0.87> 1.08 > 1.06 - Discontinue Lasix on discharge >> patient is euvolemic and pressures have been soft - Continue Coreg 12.5 mg daily and Entresto as BP  - BP too soft for long-acting Nitrates at this point, but can consider outpatient - Patient is euvolemic >> continue to hold diuretics  Permanent Afib - Rate controlled on coreg - coumadin on hold for cath. -INR 1.2today >>Start warfarin per pharmacy  - K+ 4.8 >> on Potassium 20  mEq daily - Mag 2.0  Acute on chronic Anemia - Hgb in 2018 was 11. This admission was 8.8and trending upward to 9.3today. - Patient has not reported any bleeding -Stable Hgb during admission - Needs GI work-up outpatient  Hyperlipidemia - Atorvastatin 10 mg home dose -LDL 49 this admit  Hypertension - Coreg home med - Stable  CKD stage III - Creatinine improved1.00> 0.86> 1.08  - Avoid nephrotoxic agents  H/o DVT and PE and protein C deficiency - A/C Coumadin - IV heparin transitioning to warfarin per pharmacy - INR 1.2  PNA - Per recent CXR - abx per IM  DM2 - per IM - A1C 7.6  For questions or updates, please contact [facility] Please consult [email or web address] for contact info under        Signed, Egan Sahlin David Stall, PA-C  07/29/2019, 7:38 AM

## 2019-07-29 NOTE — Progress Notes (Addendum)
ANTICOAGULATION CONSULT NOTE - Follow Up Consult  Pharmacy Consult for Heparin + Coumadin>>Lovenox Indication: hx PE/protein S deficiency  Allergies  Allergen Reactions  . Benadryl [Diphenhydramine Hcl (Sleep)] Other (See Comments)    "Makes me wild, edgy acting"  . Hand Sanitizer [Ethyl Alcohol (Skin Cleanser)] Swelling    Severe swelling  . Adhesive [Tape] Rash  . Penicillins Hives, Swelling and Rash    Did it involve swelling of the face/tongue/throat, SOB, or low BP? Did it involve sudden or severe rash/hives, skin peeling, or any reaction on the inside of your mouth or nose?  Did you need to seek medical attention at a hospital or doctor's office?  When did it last happen? If all above answers are "NO", may proceed with cephalosporin use..  . Soap Rash  . Streptomycin Sulfate [Streptomycin] Hives, Swelling and Rash    Patient Measurements: Height: 5' (152.4 cm) Weight: 155 lb 10.3 oz (70.6 kg) IBW/kg (Calculated) : 45.5 Heparin Dosing Weight:    Vital Signs: Temp: 98.2 F (36.8 C) (09/18 0421) Temp Source: Oral (09/18 0421) BP: 125/76 (09/18 0421) Pulse Rate: 92 (09/18 0421)  Labs: Recent Labs    07/27/19 0513 07/28/19 0531 07/28/19 1113 07/28/19 1117 07/28/19 1423 07/29/19 0417 07/29/19 0738  HGB 9.2* 9.8* 9.5*  9.5* 9.5*  --  9.3*  --   HCT 28.7* 29.2* 28.0*  28.0* 28.0*  --  29.9*  --   PLT 169 178  --   --   --  191  --   LABPROT 36.6* 16.6*  --   --   --  15.2  --   INR 3.8* 1.4*  --   --   --  1.2  --   HEPARINUNFRC  --   --   --   --  <0.10*  --  <0.10*  CREATININE 0.98 1.08*  --   --   --  1.06*  --     Estimated Creatinine Clearance: 34 mL/min (A) (by C-G formula based on SCr of 1.06 mg/dL (H)).  Assessment:  Anticoag: Warfarin PTA for hx PE/protein S deficiency. S/p cath, heparin resumed. INR= 1.2. Lots of Vit K to overcome from 9/16. Hep level <0.1. Anemia noted but stable.  -PTA Coumadin last known dose was 2.5mg /d - 9/16: Vit K 5mg   po x 1 and Vit K 2mg  IV - 9/17 Cath: Multivessel CAD. Medical therapy vs intervention.  Goal of Therapy:  Heparin level 0.3-0.7 units/ml Monitor platelets by anticoagulation protocol: Yes   Plan:  Coumadin 6mg  po x 1, Daily INR Increase IV heparin to 1100 units/hr, recheck in 6 hrs. Check HL and CBC in AM   1130: d/c IV heparin and change to LMWH 1.5mg /kg/d (borderline CrCl) at 100mg  SQ daily.  Lorrin Nawrot S. Alford Highland, PharmD, BCPS Clinical Staff Pharmacist Eilene Ghazi Stillinger 07/29/2019,9:45 AM

## 2019-07-29 NOTE — TOC Transition Note (Addendum)
Transition of Care [facility]) - CM/SW Discharge Note   Patient Details  Name: Despina Boan MRN: [id number] Date of Birth: Dec 24, 1931  [facility] CM/SW Contact:  Zenon Mayo, RN Phone Number: 07/29/2019, 1:00 PM   Clinical Narrative:    Patient for dc to [place] called for pickup at 1:30, confirmed with staff RN Zigmund Daniel.  Patient and family member , [place] notified.  Mardene Celeste with [facility] notified.  Staff RN to call report to [phone].  She will be going to a private room [address] on [address]. MD notified to sign DNR form.   Final next level of care: Skilled Nursing Facility Barriers to Discharge: No Barriers Identified   Patient Goals and CMS Choice Patient states their goals for this hospitalization and ongoing recovery are:: SNF CMS Medicare.gov Compare Post Acute Care list provided to:: Patient Choice offered to / list presented to : Patient  Discharge Placement              Patient chooses bed at: [facility]) Patient to be transferred to facility by: [place] Name of family member notified: juanita Howett Patient and family notified of of transfer: 07/29/19  Discharge Plan and Services In-house Referral: NA Discharge Planning Services: CM Consult Post Acute Care Choice: [facility]          DME Arranged: (NA)         HH Arranged: NA          Social Determinants of Health (SDOH) Interventions     Readmission Risk Interventions No flowsheet data found.

## 2019-07-29 NOTE — Progress Notes (Signed)
MD, Pt did not want to go back to the [facility], on her discharge, she would rather go to another facility if available, Thanks Arvella Nigh RN

## 2019-07-29 NOTE — Progress Notes (Signed)
Patient has f/u appointment 10/1 for post-hospital visit with Dr. Bronson Ing. Will send a message to the office to arrange GI f/u.   Alexsander Cavins Kathlen Mody, PA-C

## 2019-07-29 NOTE — Discharge Summary (Signed)
Physician Discharge Summary  Rebecca Hanson TDS:[id number] DOB: 1932-10-18 DOA: 07/24/2019  PCP: Ignatius Specking, MD  Admit date: 07/24/2019 Discharge date: 07/29/2019  Time spent: 35 minutes  Recommendations for Outpatient Follow-up:  PCP in 1 week Cardiology Dr. Purvis Sheffield 10/1 Coumadin with Lovenox bridge until INR therapeutic greater than 2 for DVT/PE  Discharge Diagnoses:  Principal Problem: DO NOT RESUSCITATE   Elevated troponin   Multivessel CAD   Non-ST elevation MI   Polymyalgia rheumatica (HCC)   Essential hypertension   History of DVT (deep vein thrombosis)   History of pulmonary embolism   History of congestive heart failure   Chest pain   SOB (shortness of breath)   DCM (dilated cardiomyopathy) (HCC)   Nonrheumatic mitral valve regurgitation   Dyspnea   Discharge Condition: stable  Diet recommendation: heart healthy diabetic  Filed Weights   07/27/19 0034 07/28/19 0441 07/29/19 0500  Weight: 71.1 kg 71.3 kg 70.6 kg    History of present illness:  Rebecca Hanson an 85 y.o.femalewithhistory of chronic systolic heart failure last EF measured in July 2020 was 45 to 50%, atrial fibrillation, history of protein C deficiency with pulmonary embolism on Coumadin, mitral regurgitation, diabetes mellitus, chronic kidney disease stage III, anemia was brought to the ER at [facility] with complaints of having shortness of breath was given 1 dose of Lasix and discharged back to skilled nursing facility. Since patient had mildly elevated troponin patient was sent back to the ER for further work-up.But given that patient's troponin was mildly increasing from 0.14 - 0.28patient was transferred to [facility] Course:   Non-ST elevation MI versus demand ischemia -High-sensitivity troponin peak was 1310 -2D echocardiogram showed EF of 45 to 50% with diffuse hypokinesis -Cardiology consulted, since INR was up supratherapeutic we had to hold Coumadin  for 2 days until INR was less than 1.5 for cath, underwent left heart cath yesterday which showed two-vessel obstructive CAD and nondominant circumflex and LAD with patent large RCA, initial trial of medical therapy was recommended by interventional cardiology, plan at this time to continue beta-blocker and statin, no aspirin given anemia and concurrent Coumadin use  -At this time BP is not felt to be stable enough to tolerate nitrates this can be revisited as outpatient  -Low up with Dr.Koneswaran  arranged for 10/1  Acute on chronic systolic CHF -Echo as noted above, EF 45 to 50% -appears close to euvolemic, diuretics resumed, Lasix 20 mg daily with Entresto recommended per cardiology -Cardiology following, started on Entresto this admission, cath with two-vessel CAD as above  Moderate to severe regurgitation -Noted on 2D echo -Medical management as above  Acute on chronic anemia -Likely worsened in the setting of hemodilution, improved appropriately with diuresis to 98, monitor with diuresis -Anemia panel with mild Iron defi, given IV iron yesterday, add oral iron therapy at follow-up  Permanent atrial fibrillation -Currently in A. fib, rate controlled -Continue carvedilol, INR was supratherapeutic on Coumadin, Coumadin resumed, with Lovenox bridge, INR subtherapeutic at 1.2 today anticipate will need Lovenox for 3 to 4 days until INR greater than 2 and then Lovenox can be discontinued  Stage III chronic kidney disease -Stable, monitor with diuresis  History of DVT/PE, protein C deficiency -On Coumadin at baseline -Post catheterization Coumadin resumed, with Lovenox bridge, INR subtherapeutic at 1.2 today anticipate will need Lovenox for 3 to 4 days until INR greater than 2 and then Lovenox can be discontinued  Type 2 diabetes mellitus -Stable, continue  sliding scale insulin  History of PMR -Continue low-dose prednisone  Possible atypical pneumonia versus CHF -Clinically  symptoms more suggestive of CHF, however given elevated procalcitonin level as well agree with doxycycline x5 days total, stop biotics in 2 days  Code Status: d/w pt agrees to DNR  Discharge Exam: Vitals:   07/29/19 0906 07/29/19 1010  BP:  (!) 99/41  Pulse:  69  Resp:  18  Temp:  (!) 97.1 F (36.2 C)  SpO2: 96% 100%    General: AAOx3 Cardiovascular: S1S2/RRR Respiratory: CTAB  Discharge Instructions   Discharge Instructions    Diet - low sodium heart healthy   Complete by: As directed    Diet Carb Modified   Complete by: As directed    Increase activity slowly   Complete by: As directed      Allergies as of 07/29/2019      Reactions   Benadryl [diphenhydramine Hcl (sleep)] Other (See Comments)   "Makes me wild, edgy acting"   Control and instrumentation engineer [ethyl Alcohol (skin Cleanser)] Swelling   Severe swelling   Adhesive [tape] Rash   Penicillins Hives, Swelling, Rash   Did it involve swelling of the face/tongue/throat, SOB, or low BP? Did it involve sudden or severe rash/hives, skin peeling, or any reaction on the inside of your mouth or nose?  Did you need to seek medical attention at a hospital or doctor's office?  When did it last happen? If all above answers are "NO", may proceed with cephalosporin use..   Soap Rash   Streptomycin Sulfate [streptomycin] Hives, Swelling, Rash      Medication List    TAKE these medications   acetaminophen 325 MG tablet Commonly known as: TYLENOL Take 650 mg by mouth every 4 (four) hours as needed (for pain).   All Day Allergy 10 MG tablet Generic drug: cetirizine Take 10 mg by mouth daily.   atorvastatin 10 MG tablet Commonly known as: LIPITOR Take 10 mg by mouth every evening.   carvedilol 12.5 MG tablet Commonly known as: COREG Take 12.5 mg by mouth 2 (two) times daily.   Daily-Vite Tabs Take 1 tablet by mouth daily.   docusate sodium 100 MG capsule Commonly known as: COLACE Take 100 mg by mouth 2 (two) times  daily as needed for mild constipation.   doxycycline 100 MG tablet Commonly known as: VIBRA-TABS Take 1 tablet (100 mg total) by mouth 2 (two) times daily for 2 days.   enoxaparin 100 MG/ML injection Commonly known as: LOVENOX Inject 1 mL (100 mg total) into the skin daily for 3 days. Lovenox bridge with Coumadin for 3 to 4 days until INR greater than 2 and then stop Coumadin   furosemide 20 MG tablet Commonly known as: Lasix Take 1 tablet (20 mg total) by mouth daily. What changed:   medication strength  how much to take   glimepiride 1 MG tablet Commonly known as: AMARYL Take 1 mg by mouth daily with breakfast.   metFORMIN 500 MG tablet Commonly known as: GLUCOPHAGE Take 500 mg by mouth 2 (two) times daily.   potassium chloride 10 MEQ tablet Commonly known as: K-DUR Take 1 tablet (10 mEq total) by mouth daily. What changed:   medication strength  how much to take   predniSONE 5 MG tablet Commonly known as: DELTASONE Take 5 mg by mouth 2 (two) times daily with a meal.   sacubitril-valsartan 24-26 MG Commonly known as: ENTRESTO Take 1 tablet by mouth 2 (two) times daily.  warfarin 4 MG tablet Commonly known as: COUMADIN Take 1 tablet (4 mg total) by mouth daily for 5 days. Recheck INR 07/27/2019      Allergies  Allergen Reactions  . Benadryl [Diphenhydramine Hcl (Sleep)] Other (See Comments)    "Makes me wild, edgy acting"  . Hand Sanitizer [Ethyl Alcohol (Skin Cleanser)] Swelling    Severe swelling  . Adhesive [Tape] Rash  . Penicillins Hives, Swelling and Rash    Did it involve swelling of the face/tongue/throat, SOB, or low BP? Did it involve sudden or severe rash/hives, skin peeling, or any reaction on the inside of your mouth or nose?  Did you need to seek medical attention at a hospital or doctor's office?  When did it last happen? If all above answers are "NO", may proceed with cephalosporin use..  . Soap Rash  . Streptomycin Sulfate  [Streptomycin] Hives, Swelling and Rash    Contact information for follow-up providers    Laqueta LindenKoneswaran, Suresh A, MD Follow up on 08/11/2019.   Specialty: Cardiology Why: Hospital follow-up October 1 at 2:20 PM Contact information: [address] [place] [address] [phone]            Contact information for after-discharge care    Dest[facility]    [facility] .   Service: Skilled Nursing Contact information: [address] [place] [id number] [phone]                   The results of significant diagnostics from this hospitalization (including imaging, microbiology, ancillary and laboratory) are listed below for reference.    Significant Diagnostic Studies: Dg Chest 2 View  Result Date: 07/27/2019 CLINICAL DATA:  Congestive heart failure.  Right-sided chest pain. EXAM: CHEST - 2 VIEW COMPARISON:  Chest x-ray dated July 24, 2019. FINDINGS: Unchanged mild cardiomegaly. Essentially resolved interstitial and right basilar opacities. Unchanged scarring/atelectasis in the left mid lung. No pleural effusion or pneumothorax. No acute osseous abnormality. IMPRESSION: 1. Essentially resolved pulmonary edema. Electronically Signed   By: Obie DredgeWilliam T Derry M.D.   On: 07/27/2019 10:25   Dg Chest Port 1 View  Result Date: 07/24/2019 CLINICAL DATA:  Shortness of breath EXAM: PORTABLE CHEST 1 VIEW COMPARISON:  07/24/2019, 07/01/2019 FINDINGS: Diffuse bilateral interstitial and ground-glass opacity with more confluent airspace disease at the right base and left lower lung. Aeration on the right appears slightly improved. The heart is enlarged. Aortic atherosclerosis. No pneumothorax. IMPRESSION: 1. Slight improvement in aeration on the right. Residual fine interstitial and ground-glass opacity, presumably due to infection with more confluent airspace disease at the right base and left mid to lower lung. 2.  Cardiomegaly Electronically Signed   By: Jasmine PangKim  Fujinaga M.D.   On: 07/24/2019 21:20    Microbiology: Recent Results (from the past 240 hour(s))  SARS Coronavirus 2 [facility](Hospital order, [facility]) Nasopharyngeal Nasopharyngeal Swab     Status: None   Collection Time: 07/24/19  7:50 PM   Specimen: Nasopharyngeal Swab  Result Value Ref Range Status   SARS Coronavirus 2 NEGATIVE NEGATIVE Final    Comment: (NOTE) If result is NEGATIVE SARS-CoV-2 target nucleic acids are NOT DETECTED. The SARS-CoV-2 RNA is generally detectable in upper and lower  respiratory specimens during the acute phase of infection. The lowest  concentration of SARS-CoV-2 viral copies this assay can detect is 250  copies / mL. A negative result does not preclude SARS-CoV-2 infection  and should  not be used as the sole basis for treatment or other  patient management decisions.  A negative result may occur with  improper specimen collection / handling, submission of specimen other  than nasopharyngeal swab, presence of viral mutation(s) within the  areas targeted by this assay, and inadequate number of viral copies  (<250 copies / mL). A negative result must be combined with clinical  observations, patient history, and epidemiological information. If result is POSITIVE SARS-CoV-2 target nucleic acids are DETECTED. The SARS-CoV-2 RNA is generally detectable in upper and lower  respiratory specimens dur ing the acute phase of infection.  Positive  results are indicative of active infection with SARS-CoV-2.  Clinical  correlation with patient history and other diagnostic information is  necessary to determine patient infection status.  Positive results do  not rule out bacterial infection or co-infection with other viruses. If result is PRESUMPTIVE POSTIVE SARS-CoV-2 nucleic acids MAY BE PRESENT.   A presumptive positive result was obtained on the submitted specimen  and confirmed on repeat  testing.  While 2019 novel coronavirus  (SARS-CoV-2) nucleic acids may be present in the submitted sample  additional confirmatory testing may be necessary for epidemiological  and / or clinical management purposes  to differentiate between  SARS-CoV-2 and other Sarbecovirus currently known to infect humans.  If clinically indicated additional testing with an alternate test  methodology [phone]) is advised. The SARS-CoV-2 RNA is generally  detectable in upper and lower respiratory sp ecimens during the acute  phase of infection. The expected result is Negative. Fact Sheet for Patients:  StrictlyIdeas.no Fact Sheet for Healthcare Providers: BankingDealers.co.za This test is not yet approved or cleared by the [place] FDA and has been authorized for detection and/or diagnosis of SARS-CoV-2 by FDA under an Emergency Use Authorization (EUA).  This EUA will remain in effect (meaning this test can be used) for the duration of the COVID-19 declaration under Section 564(b)(1) of the Act, 21 U.S.C. section 360bbb-3(b)(1), unless the authorization is terminated or revoked sooner. Performed at [facility], [place] [address] [place], [place] [address]   SARS CORONAVIRUS 2 (TAT 6-24 HRS) Nasopharyngeal Nasopharyngeal Swab     Status: None   Collection Time: 07/28/19 11:49 PM   Specimen: Nasopharyngeal Swab  Result Value Ref Range Status   SARS Coronavirus 2 NEGATIVE NEGATIVE Final    Comment: (NOTE) SARS-CoV-2 target nucleic acids are NOT DETECTED. The SARS-CoV-2 RNA is generally detectable in upper and lower respiratory specimens during the acute phase of infection. Negative results do not preclude SARS-CoV-2 infection, do not rule out co-infections with other pathogens, and should not be used as the sole basis for treatment or other patient management decisions. Negative results must be combined with clinical observations, patient  history, and epidemiological information. The expected result is Negative. Fact Sheet for Patients: SugarRoll.be Fact Sheet for Healthcare Providers: https://www.woods-mathews.com/ This test is not yet approved or cleared by the [place] FDA and  has been authorized for detection and/or diagnosis of SARS-CoV-2 by FDA under an Emergency Use Authorization (EUA). This EUA will remain  in effect (meaning this test can be used) for the duration of the COVID-19 declaration under Section 56 4(b)(1) of the Act, 21 U.S.C. section 360bbb-3(b)(1), unless the authorization is terminated or revoked sooner. Performed at [facility], [place] [address] [place], [place] [address]      Labs: Basic Metabolic Panel: Recent Labs  Lab 07/24/19 2200 07/25/19 [phone] 07/26/19 8416 07/27/19 0513 07/28/19  [id number] 07/28/19 1113 07/28/19 1117 07/29/19 0417  NA 135 135 135 136 135 139  139 138 137  K 3.9 4.0 3.7 4.2 4.4 4.3  4.2 4.2 4.8  CL 101 103 100 105 101  --   --  106  CO2 24 22 23 22 24   --   --  24  GLUCOSE 220* 98 153* 168* 166*  --   --  148*  BUN 25* 27* 21 21 23   --   --  23  CREATININE 1.25* 1.00 0.87 0.98 1.08*  --   --  1.06*  CALCIUM 9.1 8.7* 8.6* 8.6* 8.5*  --   --  8.8*  MG 1.2*  --  2.0  --   --   --   --   --    Liver Function Tests: Recent Labs  Lab 07/24/19 2200  AST 26  ALT 12  ALKPHOS 49  BILITOT 0.6  PROT 5.4*  ALBUMIN 2.7*   No results for input(s): LIPASE, AMYLASE in the last 168 hours. No results for input(s): AMMONIA in the last 168 hours. CBC: Recent Labs  Lab 07/24/19 2200 07/25/19 0607 07/26/19 0620 07/27/19 0513 07/28/19 0531 07/28/19 1113 07/28/19 1117 07/29/19 0417  WBC 6.0 6.3 5.7 6.8 7.9  --   --  7.4  NEUTROABS 4.0  --   --   --   --   --   --   --   HGB 9.0* 8.8* 9.5* 9.2* 9.8* 9.5*  9.5* 9.5* 9.3*  HCT 28.0* 27.4* 28.8* 28.7* 29.2* 28.0*  28.0* 28.0* 29.9*  MCV 87.2 87.0 85.2 87.0 85.9  --    --  88.2  PLT 142* 141* 145* 169 178  --   --  191   Cardiac Enzymes: No results for input(s): CKTOTAL, CKMB, CKMBINDEX, TROPONINI in the last 168 hours. BNP: BNP (last 3 results) Recent Labs    07/24/19 2200 07/27/19 0513  BNP 459.9* 176.8*    ProBNP (last 3 results) No results for input(s): PROBNP in the last 8760 hours.  CBG: Recent Labs  Lab 07/27/19 2117 07/28/19 0557 07/28/19 1207 07/28/19 1606 07/29/19 0652  GLUCAP 114* 158* 142* 154* 142*       Signed:  Zannie CovePreetha Shyanne Mcclary MD.  [facility] 07/29/2019, 11:33 AM

## 2019-07-29 NOTE — Discharge Instructions (Signed)

## 2019-07-30 LAB — GLUCOSE, CAPILLARY: Glucose-Capillary: 187 mg/dL — ABNORMAL HIGH (ref 70–99)

## 2019-08-03 ENCOUNTER — Encounter: Payer: Self-pay | Admitting: Gastroenterology

## 2019-08-09 DIAGNOSIS — D509 Iron deficiency anemia, unspecified: Secondary | ICD-10-CM | POA: Diagnosis not present

## 2019-08-09 DIAGNOSIS — E119 Type 2 diabetes mellitus without complications: Secondary | ICD-10-CM | POA: Diagnosis not present

## 2019-08-09 DIAGNOSIS — F329 Major depressive disorder, single episode, unspecified: Secondary | ICD-10-CM | POA: Diagnosis not present

## 2019-08-11 ENCOUNTER — Encounter: Payer: Self-pay | Admitting: Cardiovascular Disease

## 2019-08-11 ENCOUNTER — Encounter: Payer: Medicare Other | Admitting: Cardiovascular Disease

## 2019-08-11 DIAGNOSIS — N183 Chronic kidney disease, stage 3 unspecified: Secondary | ICD-10-CM | POA: Diagnosis not present

## 2019-08-11 DIAGNOSIS — I214 Non-ST elevation (NSTEMI) myocardial infarction: Secondary | ICD-10-CM | POA: Diagnosis not present

## 2019-08-11 DIAGNOSIS — I4821 Permanent atrial fibrillation: Secondary | ICD-10-CM | POA: Diagnosis not present

## 2019-08-11 DIAGNOSIS — M6281 Muscle weakness (generalized): Secondary | ICD-10-CM | POA: Diagnosis not present

## 2019-08-11 DIAGNOSIS — R2689 Other abnormalities of gait and mobility: Secondary | ICD-10-CM | POA: Diagnosis not present

## 2019-08-11 DIAGNOSIS — E1165 Type 2 diabetes mellitus with hyperglycemia: Secondary | ICD-10-CM | POA: Diagnosis not present

## 2019-08-11 DIAGNOSIS — Z48812 Encounter for surgical aftercare following surgery on the circulatory system: Secondary | ICD-10-CM | POA: Diagnosis not present

## 2019-08-11 DIAGNOSIS — R41841 Cognitive communication deficit: Secondary | ICD-10-CM | POA: Diagnosis not present

## 2019-08-11 DIAGNOSIS — M353 Polymyalgia rheumatica: Secondary | ICD-10-CM | POA: Diagnosis not present

## 2019-08-11 DIAGNOSIS — I5023 Acute on chronic systolic (congestive) heart failure: Secondary | ICD-10-CM | POA: Diagnosis not present

## 2019-08-11 DIAGNOSIS — J189 Pneumonia, unspecified organism: Secondary | ICD-10-CM | POA: Diagnosis not present

## 2019-08-11 DIAGNOSIS — J9611 Chronic respiratory failure with hypoxia: Secondary | ICD-10-CM | POA: Diagnosis not present

## 2019-08-11 DIAGNOSIS — J441 Chronic obstructive pulmonary disease with (acute) exacerbation: Secondary | ICD-10-CM | POA: Diagnosis not present

## 2019-08-11 NOTE — Progress Notes (Deleted)
{Choose 1 Note Type (Telehealth Visit or Telephone Visit):[phone]}   Date:  08/11/2019   ID:  Rebecca Hanson, DOB 08-28-1932, MRN [id number]  {Patient Location:[phone]::"Home"} {Provider Location:[phone]::"Home"}  PCP:  Glenda Chroman, MD  Cardiologist:  Kate Sable, MD *** Electrophysiologist:  None   Evaluation Performed:  {Choose Visit Type:[phone]::"Follow-Up Visit"}  Chief Complaint: Coronary artery disease  History of Present Illness:    Rebecca Hanson is a 85 y.o. female with coronary artery disease. She has a history of protein S deficiency and pulmonary embolism. She has venous varicosities.  She was recently hospitalized for non-STEMI and decompensated heart failure.  Cardiac catheterization was performed and reviewed below.  Initial trial of medical therapy was recommended.  Blood pressure was too low to add nitrates.  She was also treated for an atypical pneumonia with antibiotics.  She is currently at a nursing home.    The patient {does/does not:200015} have symptoms concerning for COVID-19 infection (fever, chills, cough, or new shortness of breath).    Past Medical History:  Diagnosis Date  . CHF (congestive heart failure) ([place])   . Diabetes mellitus without complication ([place])   . Dyslipidemia    Past Surgical History:  Procedure Laterality Date  . BREAST BIOPSY    . CATARACT EXTRACTION    . RIGHT/LEFT HEART CATH AND CORONARY ANGIOGRAPHY N/A 07/28/2019   Procedure: RIGHT/LEFT HEART CATH AND CORONARY ANGIOGRAPHY;  Surgeon: Troy Sine, MD;  Location: [facility];  Service: Cardiovascular;  Laterality: N/A;     No outpatient medications have been marked as taking for the 08/11/19 encounter (Appointment) with Herminio Commons, MD.     Allergies:   Benadryl [diphenhydramine hcl (sleep)], Hand sanitizer [ethyl alcohol (skin cleanser)], Adhesive [tape], Penicillins, Soap, and Streptomycin sulfate [streptomycin]   Social  History   Tobacco Use  . Smoking status: Never Smoker  . Smokeless tobacco: Never Used  Substance Use Topics  . Alcohol use: No    Alcohol/week: 0.0 standard drinks  . Drug use: No     Family Hx: The patient's family history includes CAD in her father; CVA in her father; Heart disease in her father.  ROS:   Please see the history of present illness.    *** All other systems reviewed and are negative.   Prior CV studies:   The following studies were reviewed today:  Cardiac catheterization 07/28/2019:   Ost LAD to Prox LAD lesion is 50% stenosed.  Prox LAD to Mid LAD lesion is 80% stenosed.  1st Sept lesion is 20% stenosed.  Prox Cx lesion is 80% stenosed.   Multivessel CAD with 50% ostial LAD stenosis followed by diffuse 80% proximal to mid LAD stenoses; 80% proximal circumflex stenoses, and a dominant RCA which gives rise to a conus branch that has an 85% ostial conus branch stenosis, and otherwise no significant obstruction in the RCA which supplies a large PDA and PLA vessel.  Mild right heart pressure elevation with PA systolic pressure at 39 mm.  RECOMMENDATION: Angiograms will be reviewed with colleagues.  With her age of 69 years, consider an initial increased medical therapy trial and if fails then discuss possible intervention which should be done from the groin approach due to the very small caliber radial vessel.  Patient is on chronic warfarin therapy for her permanent atrial fibrillation which has been held for the procedure and will be restarted on heparin this evening following the catheterization.   Echocardiogram 07/25/2019:   1. The left ventricle  has mild-moderately reduced systolic function, with an ejection fraction of 40-45%. The cavity size was normal. Left ventricular diastolic Doppler parameters are indeterminate. Left ventricular diffuse hypokinesis.  2. Normal RV size with mildly decreased systolic function.  3. There is mild mitral annular  calcification present. Mitral valve regurgitation is mild to moderate by color flow Doppler. No evidence of mitral valve stenosis.  4. The aortic valve is tricuspid. Mild calcification of the aortic valve. No stenosis of the aortic valve.  5. The aortic root is normal in size and structure.  6. Left atrial size was severely dilated.  7. Right atrial size was mildly dilated.  8. The inferior vena cava was dilated in size with <50% respiratory variability. PA systolic pressure 44 mmHg.  Labs/Other Tests and Data Reviewed:    EKG:  {EKG/Telemetry Strips Reviewed:[phone]}  Recent Labs: 07/24/2019: ALT 12; TSH 0.661 07/26/2019: Magnesium 2.0 07/27/2019: B Natriuretic Peptide 176.8 07/29/2019: BUN 23; Creatinine, Ser 1.06; Hemoglobin 9.3; Platelets 191; Potassium 4.8; Sodium 137   Recent Lipid Panel Lab Results  Component Value Date/Time   CHOL 90 07/26/2019 06:20 AM   TRIG 71 07/26/2019 06:20 AM   HDL 27 (L) 07/26/2019 06:20 AM   CHOLHDL 3.3 07/26/2019 06:20 AM   LDLCALC 49 07/26/2019 06:20 AM    Wt Readings from Last 3 Encounters:  07/29/19 155 lb 10.3 oz (70.6 kg)  06/06/19 171 lb 3.2 oz (77.7 kg)  12/01/18 184 lb (83.5 kg)     Objective:    Vital Signs:  There were no vitals taken for this visit.   {[facility]Care Virtual Exam (Optional):[phone]::"VITAL SIGNS:  reviewed"}  ASSESSMENT & PLAN:    1.  Coronary artery disease: Cardiac catheterization from 07/28/2019 reviewed above.  Medical management has been recommended.  Continue carvedilol and atorvastatin.  No aspirin given need for warfarin.  2.  Permanent atrial fibrillation: Anticoagulated with warfarin.  Heart rate controlled with carvedilol 12.5 mg twice daily.  3.  Chronic systolic heart failure: LVEF 40 to 45% by echocardiogram on 07/25/2019.  Currently on carvedilol and Entresto.  Diuresed with Lasix 20 mg daily.  4. Pulmonary embolism/protein S deficiency: Continue warfarin.INR monitored by PCP.    COVID-19  Education: The signs and symptoms of COVID-19 were discussed with the patient and how to seek care for testing (follow up with PCP or arrange E-visit).  ***The importance of social distancing was discussed today.  Time:   Today, I have spent *** minutes with the patient with telehealth technology discussing the above problems.     Medication Adjustments/Labs and Tests Ordered: Current medicines are reviewed at length with the patient today.  Concerns regarding medicines are outlined above.   Tests Ordered: No orders of the defined types were placed in this encounter.   Medication Changes: No orders of the defined types were placed in this encounter.   Follow Up:  {F/U [phone]} {follow up:15908}  Signed, Prentice Docker, MD  08/11/2019 2:24 PM    [facility]

## 2019-08-12 NOTE — Progress Notes (Signed)
This encounter was created in error - please disregard.

## 2019-08-15 ENCOUNTER — Other Ambulatory Visit: Payer: Self-pay | Admitting: *Deleted

## 2019-08-15 DIAGNOSIS — I340 Nonrheumatic mitral (valve) insufficiency: Secondary | ICD-10-CM | POA: Diagnosis not present

## 2019-08-15 DIAGNOSIS — J441 Chronic obstructive pulmonary disease with (acute) exacerbation: Secondary | ICD-10-CM | POA: Diagnosis not present

## 2019-08-15 DIAGNOSIS — Z86718 Personal history of other venous thrombosis and embolism: Secondary | ICD-10-CM | POA: Diagnosis not present

## 2019-08-15 DIAGNOSIS — I5023 Acute on chronic systolic (congestive) heart failure: Secondary | ICD-10-CM | POA: Diagnosis not present

## 2019-08-15 DIAGNOSIS — I110 Hypertensive heart disease with heart failure: Secondary | ICD-10-CM | POA: Diagnosis not present

## 2019-08-15 DIAGNOSIS — I2510 Atherosclerotic heart disease of native coronary artery without angina pectoris: Secondary | ICD-10-CM | POA: Diagnosis not present

## 2019-08-15 DIAGNOSIS — D6859 Other primary thrombophilia: Secondary | ICD-10-CM | POA: Diagnosis not present

## 2019-08-15 DIAGNOSIS — Z7901 Long term (current) use of anticoagulants: Secondary | ICD-10-CM | POA: Diagnosis not present

## 2019-08-15 DIAGNOSIS — I252 Old myocardial infarction: Secondary | ICD-10-CM | POA: Diagnosis not present

## 2019-08-15 DIAGNOSIS — I4821 Permanent atrial fibrillation: Secondary | ICD-10-CM | POA: Diagnosis not present

## 2019-08-15 DIAGNOSIS — Z9981 Dependence on supplemental oxygen: Secondary | ICD-10-CM | POA: Diagnosis not present

## 2019-08-15 DIAGNOSIS — Z87440 Personal history of urinary (tract) infections: Secondary | ICD-10-CM | POA: Diagnosis not present

## 2019-08-15 DIAGNOSIS — Z5181 Encounter for therapeutic drug level monitoring: Secondary | ICD-10-CM | POA: Diagnosis not present

## 2019-08-15 DIAGNOSIS — E119 Type 2 diabetes mellitus without complications: Secondary | ICD-10-CM | POA: Diagnosis not present

## 2019-08-15 DIAGNOSIS — Z86711 Personal history of pulmonary embolism: Secondary | ICD-10-CM | POA: Diagnosis not present

## 2019-08-15 DIAGNOSIS — I420 Dilated cardiomyopathy: Secondary | ICD-10-CM | POA: Diagnosis not present

## 2019-08-15 DIAGNOSIS — J9611 Chronic respiratory failure with hypoxia: Secondary | ICD-10-CM | POA: Diagnosis not present

## 2019-08-15 DIAGNOSIS — Z7984 Long term (current) use of oral hypoglycemic drugs: Secondary | ICD-10-CM | POA: Diagnosis not present

## 2019-08-15 NOTE — Patient Outreach (Signed)
Member assessed for potential [facility] Care Management needs as a benefit of [employer] [employer].  Made aware by [facility] facility dc planner that Mrs. Oh was slated to dc home with her sister on 08/12/19.  Telephone call made to [phone] to speak with member. Mrs. Caulder endorses she is staying with her sister. She endorses she has round the clock paid caregivers. Reports she has [facility] home [facility] coming out as well.   Discussed [facility] CM follow up. Mrs. 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 Weigold denies having any [facility] CM needs or follow up. Appreciative of writer's call.  Marthenia Rolling, MSN-Ed, RN,BSN [facility] Acute Care Coordinator [phone] [facility]) [phone]  (Toll free office)

## 2019-08-17 DIAGNOSIS — J9611 Chronic respiratory failure with hypoxia: Secondary | ICD-10-CM | POA: Diagnosis not present

## 2019-08-17 DIAGNOSIS — I110 Hypertensive heart disease with heart failure: Secondary | ICD-10-CM | POA: Diagnosis not present

## 2019-08-17 DIAGNOSIS — I2510 Atherosclerotic heart disease of native coronary artery without angina pectoris: Secondary | ICD-10-CM | POA: Diagnosis not present

## 2019-08-17 DIAGNOSIS — I252 Old myocardial infarction: Secondary | ICD-10-CM | POA: Diagnosis not present

## 2019-08-17 DIAGNOSIS — J441 Chronic obstructive pulmonary disease with (acute) exacerbation: Secondary | ICD-10-CM | POA: Diagnosis not present

## 2019-08-17 DIAGNOSIS — I5023 Acute on chronic systolic (congestive) heart failure: Secondary | ICD-10-CM | POA: Diagnosis not present

## 2019-08-18 DIAGNOSIS — E1165 Type 2 diabetes mellitus with hyperglycemia: Secondary | ICD-10-CM | POA: Diagnosis not present

## 2019-08-18 DIAGNOSIS — I509 Heart failure, unspecified: Secondary | ICD-10-CM | POA: Diagnosis not present

## 2019-08-18 DIAGNOSIS — I10 Essential (primary) hypertension: Secondary | ICD-10-CM | POA: Diagnosis not present

## 2019-08-18 DIAGNOSIS — I2699 Other pulmonary embolism without acute cor pulmonale: Secondary | ICD-10-CM | POA: Diagnosis not present

## 2019-08-18 DIAGNOSIS — I429 Cardiomyopathy, unspecified: Secondary | ICD-10-CM | POA: Diagnosis not present

## 2019-08-18 DIAGNOSIS — Z299 Encounter for prophylactic measures, unspecified: Secondary | ICD-10-CM | POA: Diagnosis not present

## 2019-08-18 DIAGNOSIS — Z6832 Body mass index (BMI) 32.0-32.9, adult: Secondary | ICD-10-CM | POA: Diagnosis not present

## 2019-08-19 DIAGNOSIS — I110 Hypertensive heart disease with heart failure: Secondary | ICD-10-CM | POA: Diagnosis not present

## 2019-08-19 DIAGNOSIS — I2510 Atherosclerotic heart disease of native coronary artery without angina pectoris: Secondary | ICD-10-CM | POA: Diagnosis not present

## 2019-08-19 DIAGNOSIS — I5023 Acute on chronic systolic (congestive) heart failure: Secondary | ICD-10-CM | POA: Diagnosis not present

## 2019-08-19 DIAGNOSIS — I252 Old myocardial infarction: Secondary | ICD-10-CM | POA: Diagnosis not present

## 2019-08-19 DIAGNOSIS — J9611 Chronic respiratory failure with hypoxia: Secondary | ICD-10-CM | POA: Diagnosis not present

## 2019-08-19 DIAGNOSIS — J441 Chronic obstructive pulmonary disease with (acute) exacerbation: Secondary | ICD-10-CM | POA: Diagnosis not present

## 2019-08-22 DIAGNOSIS — J441 Chronic obstructive pulmonary disease with (acute) exacerbation: Secondary | ICD-10-CM | POA: Diagnosis not present

## 2019-08-22 DIAGNOSIS — J9611 Chronic respiratory failure with hypoxia: Secondary | ICD-10-CM | POA: Diagnosis not present

## 2019-08-22 DIAGNOSIS — I110 Hypertensive heart disease with heart failure: Secondary | ICD-10-CM | POA: Diagnosis not present

## 2019-08-22 DIAGNOSIS — I5023 Acute on chronic systolic (congestive) heart failure: Secondary | ICD-10-CM | POA: Diagnosis not present

## 2019-08-22 DIAGNOSIS — I252 Old myocardial infarction: Secondary | ICD-10-CM | POA: Diagnosis not present

## 2019-08-22 DIAGNOSIS — I2510 Atherosclerotic heart disease of native coronary artery without angina pectoris: Secondary | ICD-10-CM | POA: Diagnosis not present

## 2019-08-23 DIAGNOSIS — I5023 Acute on chronic systolic (congestive) heart failure: Secondary | ICD-10-CM | POA: Diagnosis not present

## 2019-08-23 DIAGNOSIS — J9611 Chronic respiratory failure with hypoxia: Secondary | ICD-10-CM | POA: Diagnosis not present

## 2019-08-23 DIAGNOSIS — I2510 Atherosclerotic heart disease of native coronary artery without angina pectoris: Secondary | ICD-10-CM | POA: Diagnosis not present

## 2019-08-23 DIAGNOSIS — I509 Heart failure, unspecified: Secondary | ICD-10-CM | POA: Diagnosis not present

## 2019-08-23 DIAGNOSIS — J441 Chronic obstructive pulmonary disease with (acute) exacerbation: Secondary | ICD-10-CM | POA: Diagnosis not present

## 2019-08-23 DIAGNOSIS — R609 Edema, unspecified: Secondary | ICD-10-CM | POA: Diagnosis not present

## 2019-08-23 DIAGNOSIS — I429 Cardiomyopathy, unspecified: Secondary | ICD-10-CM | POA: Diagnosis not present

## 2019-08-23 DIAGNOSIS — Z299 Encounter for prophylactic measures, unspecified: Secondary | ICD-10-CM | POA: Diagnosis not present

## 2019-08-23 DIAGNOSIS — I252 Old myocardial infarction: Secondary | ICD-10-CM | POA: Diagnosis not present

## 2019-08-23 DIAGNOSIS — Z6832 Body mass index (BMI) 32.0-32.9, adult: Secondary | ICD-10-CM | POA: Diagnosis not present

## 2019-08-23 DIAGNOSIS — M25579 Pain in unspecified ankle and joints of unspecified foot: Secondary | ICD-10-CM | POA: Diagnosis not present

## 2019-08-23 DIAGNOSIS — I110 Hypertensive heart disease with heart failure: Secondary | ICD-10-CM | POA: Diagnosis not present

## 2019-08-24 ENCOUNTER — Encounter: Payer: Self-pay | Admitting: Gastroenterology

## 2019-08-24 ENCOUNTER — Ambulatory Visit (INDEPENDENT_AMBULATORY_CARE_PROVIDER_SITE_OTHER): Payer: Medicare Other | Admitting: Gastroenterology

## 2019-08-24 ENCOUNTER — Other Ambulatory Visit: Payer: Self-pay

## 2019-08-24 VITALS — BP 106/52 | HR 61 | Temp 97.0°F | Ht 60.0 in | Wt 150.0 lb

## 2019-08-24 DIAGNOSIS — D649 Anemia, unspecified: Secondary | ICD-10-CM | POA: Diagnosis not present

## 2019-08-24 DIAGNOSIS — M25471 Effusion, right ankle: Secondary | ICD-10-CM | POA: Diagnosis not present

## 2019-08-24 DIAGNOSIS — R634 Abnormal weight loss: Secondary | ICD-10-CM | POA: Insufficient documentation

## 2019-08-24 NOTE — Patient Instructions (Addendum)
We are arranging a colonoscopy and upper endoscopy with Dr. Oneida Alar in the near future.  I have provided blood work to get done when home health completes the next blood draw.  I will obtain the CT scan from Dr. Woody Seller' office.   We will have you stop Coumadin 4 days before procedure and start Lovenox injections. We will discuss with Dr. Woody Seller as well. When we know the exact date, we will be reviewing this with you in detail with dates.  Further recommendations to follow!  It was a pleasure to see you today. I want to create trusting relationships with patients to provide genuine, compassionate, and quality care. I value your feedback. If you receive a survey regarding your visit,  I greatly appreciate you taking time to fill this out.   Annitta Needs, PhD, ANP-BC [facility]

## 2019-08-24 NOTE — Progress Notes (Signed)
Primary Care Physician:  Ignatius Specking, MD Primary Gastroenterologist:  Dr. Darrick Penna   Chief Complaint  Patient presents with  . Anemia    HPI:   Rebecca Hanson is an 85 y.o. female presenting today at the request of Ronie Spies, PA-C with cardiology due to anemia. She  was recently inpatient Sept 2020 with demand ischemia in setting of pneumonia and anemia, s/p cath with multivessel CAD with 50% ostial LAD stenosis followed by diffuse 80% proximal to mid LAD stenoses; 80% proximal circumflex stenoses, and a dominant RCA which gives rise to a conus branch that has an 85% ostial conus branch stenosis, and otherwise no significant obstruction in the RCA which supplies a large PDA and PLA vessel. On chronic Coumadin for afib, history of DVT and PE, protein S deficiency. Aspirin deferred for now until GI work-up complete. Recommended medical therapy. Hgb in 2018 was 11. Admitting Hgb in Sept 2020 was 9 and remained stable during hospitalization. Ferritin normal at 143. Iron 34. Chronic kidney disease noted on labs.   Hemoccult status unknown. Discharged from rehab about 2 weeks ago. Sister, Corrie Dandy (an NP), is present today with patient. Patient is a retired Engineer, civil (consulting). Believes she had a colonoscopy in [place] about 5 years ago. No prior EGD. No melena. No hematochezia. Sometimes skips a day or two with BMs. No abdominal pain. Not much of an appetite. Staying with her other sister at home. Has been on Coumadin since about 2000. Notes early satiety. No NSAIDs.   2017 was 195. Today 150.   US abdomen complete Sept 2020: normal liver. Spleen measures 12 cm. Renal cysts.   Past Medical History:  Diagnosis Date  . A-fib (HCC)   . CHF (congestive heart failure) (HCC)   . CKD (chronic kidney disease)   . Diabetes mellitus without complication (HCC)   . Dyslipidemia   . HTN (hypertension)   . Polymyalgia rheumatica (HCC)   . Protein S deficiency [facility])     Past Surgical History:  Procedure Laterality  Date  . BREAST BIOPSY    . CATARACT EXTRACTION    . RIGHT/LEFT HEART CATH AND CORONARY ANGIOGRAPHY N/A 07/28/2019   Procedure: RIGHT/LEFT HEART CATH AND CORONARY ANGIOGRAPHY;  Surgeon: Lennette Bihari, MD;  Location: [facility] INV[facility];  Service: Cardiovascular;  Laterality: N/A;    Current Outpatient Medications  Medication Sig Dispense Refill  . acetaminophen (TYLENOL) 325 MG tablet Take 650 mg by mouth every 4 (four) hours as needed (for pain).    Marland Kitchen atorvastatin (LIPITOR) 10 MG tablet Take 10 mg by mouth every evening.    . carvedilol (COREG) 12.5 MG tablet Take 12.5 mg by mouth 2 (two) times daily.     . cetirizine (ALL DAY ALLERGY) 10 MG tablet Take 10 mg by mouth daily.     Marland Kitchen docusate sodium (COLACE) 100 MG capsule Take 100 mg by mouth 2 (two) times daily as needed for mild constipation.     . furosemide (LASIX) 20 MG tablet Take 1 tablet (20 mg total) by mouth daily.    Marland Kitchen glimepiride (AMARYL) 1 MG tablet Take 1 mg by mouth daily with breakfast.    . metFORMIN (GLUCOPHAGE) 500 MG tablet Take 500 mg by mouth 2 (two) times daily.     . Multiple Vitamin (DAILY-VITE) TABS Take 1 tablet by mouth daily.    . potassium chloride SA (K-DUR) 10 MEQ tablet Take 1 tablet (10 mEq total) by mouth daily.    Marland Kitchen  sacubitril-valsartan (ENTRESTO) 24-26 MG Take 1 tablet by mouth 2 (two) times daily. 60 tablet 6  . warfarin (COUMADIN) 4 MG tablet Take 1 tablet (4 mg total) by mouth daily for 5 days. Recheck INR 07/27/2019 5 tablet 0   No current facility-administered medications for this visit.     Allergies as of 08/24/2019 - Review Complete 08/24/2019  Allergen Reaction Noted  . Benadryl [diphenhydramine hcl (sleep)] Other (See Comments)   . Hand sanitizer [ethyl alcohol (skin cleanser)] Swelling 07/24/2019  . Adhesive [tape] Rash 07/24/2019  . Penicillins Hives, Swelling, and Rash 05/29/2016  . Soap Rash 07/24/2019  . Streptomycin sulfate [streptomycin] Hives, Swelling, and Rash 05/29/2016     Family History  Problem Relation Age of Onset  . Heart disease Father   . CAD Father   . CVA Father   . Colon polyps Father        > 83 years old   . Colon polyps Sister        > 83 years old   . Colon cancer Neg Hx     Social History   Socioeconomic History  . Marital status: Widowed    Spouse name: Not on file  . Number of children: Not on file  . Years of education: Not on file  . Highest education level: Not on file  Occupational History  . Not on file  Social Needs  . Financial resource strain: Not on file  . Food insecurity    Worry: Not on file    Inability: Not on file  . Transportation needs    Medical: Not on file    Non-medical: Not on file  Tobacco Use  . Smoking status: Never Smoker  . Smokeless tobacco: Never Used  Substance and Sexual Activity  . Alcohol use: No    Alcohol/week: 0.0 standard drinks  . Drug use: No  . Sexual activity: Not on file  Lifestyle  . Physical activity    Days per week: Not on file    Minutes per session: Not on file  . Stress: Not on file  Relationships  . Social Musicianconnections    Talks on phone: Not on file    Gets together: Not on file    Attends religious service: Not on file    Active member of club or organization: Not on file    Attends meetings of clubs or organizations: Not on file    Relationship status: Not on file  . Intimate partner violence    Fear of current or ex partner: Not on file    Emotionally abused: Not on file    Physically abused: Not on file    Forced sexual activity: Not on file  Other Topics Concern  . Not on file  Social History Narrative  . Not on file    Review of Systems: Gen:see HPI CV: Denies chest pain, heart palpitations, peripheral edema, syncope.  Resp: Denies shortness of breath at rest or with exertion. Denies wheezing or cough.  GI: see HPI GU : Denies urinary burning, urinary frequency, urinary hesitancy MS: constant joint pain. Uses walker. Left foot swollen from injury  and difficulty walking around.  Derm: Denies rash, itching, dry skin Psych: Denies depression, anxiety, memory loss, and confusion Heme: see HPI  Physical Exam: BP (!) 106/52   Pulse 61   Temp (!) 97 F (36.1 C) (Oral)   Ht 5' (1.524 m)   Wt 150 lb (68 kg)   BMI 29.29 kg/m  General:   Alert and oriented. Pleasant and cooperative. Well-nourished and well-developed.  Head:  Normocephalic and atraumatic. Eyes:  Without icterus, sclera clear and conjunctiva pink.  Lungs:  Clear to auscultation bilaterally.  Heart:  S1, S2 present, irregularly irregular Abdomen:  +BS, soft, non-tender and non-distended. No HSM noted. Limited with patient in chair Rectal:  Deferred  Msk:  Kyphosis  Extremities:  Bilateral lower extremity edema, left ankle greater than right (previous fall) Neurologic:  Alert and  oriented x4 Psych:  Alert and cooperative. Normal mood and affect.

## 2019-08-25 DIAGNOSIS — J9611 Chronic respiratory failure with hypoxia: Secondary | ICD-10-CM | POA: Diagnosis not present

## 2019-08-25 DIAGNOSIS — I110 Hypertensive heart disease with heart failure: Secondary | ICD-10-CM | POA: Diagnosis not present

## 2019-08-25 DIAGNOSIS — I252 Old myocardial infarction: Secondary | ICD-10-CM | POA: Diagnosis not present

## 2019-08-25 DIAGNOSIS — J441 Chronic obstructive pulmonary disease with (acute) exacerbation: Secondary | ICD-10-CM | POA: Diagnosis not present

## 2019-08-25 DIAGNOSIS — I2510 Atherosclerotic heart disease of native coronary artery without angina pectoris: Secondary | ICD-10-CM | POA: Diagnosis not present

## 2019-08-25 DIAGNOSIS — I5023 Acute on chronic systolic (congestive) heart failure: Secondary | ICD-10-CM | POA: Diagnosis not present

## 2019-08-26 DIAGNOSIS — Z7901 Long term (current) use of anticoagulants: Secondary | ICD-10-CM | POA: Diagnosis not present

## 2019-08-26 DIAGNOSIS — J9611 Chronic respiratory failure with hypoxia: Secondary | ICD-10-CM | POA: Diagnosis not present

## 2019-08-26 DIAGNOSIS — I5023 Acute on chronic systolic (congestive) heart failure: Secondary | ICD-10-CM | POA: Diagnosis not present

## 2019-08-26 DIAGNOSIS — I2510 Atherosclerotic heart disease of native coronary artery without angina pectoris: Secondary | ICD-10-CM | POA: Diagnosis not present

## 2019-08-26 DIAGNOSIS — I110 Hypertensive heart disease with heart failure: Secondary | ICD-10-CM | POA: Diagnosis not present

## 2019-08-26 DIAGNOSIS — D649 Anemia, unspecified: Secondary | ICD-10-CM | POA: Diagnosis not present

## 2019-08-26 DIAGNOSIS — I252 Old myocardial infarction: Secondary | ICD-10-CM | POA: Diagnosis not present

## 2019-08-26 DIAGNOSIS — J441 Chronic obstructive pulmonary disease with (acute) exacerbation: Secondary | ICD-10-CM | POA: Diagnosis not present

## 2019-08-29 DIAGNOSIS — I2510 Atherosclerotic heart disease of native coronary artery without angina pectoris: Secondary | ICD-10-CM | POA: Diagnosis not present

## 2019-08-29 DIAGNOSIS — J441 Chronic obstructive pulmonary disease with (acute) exacerbation: Secondary | ICD-10-CM | POA: Diagnosis not present

## 2019-08-29 DIAGNOSIS — I252 Old myocardial infarction: Secondary | ICD-10-CM | POA: Diagnosis not present

## 2019-08-29 DIAGNOSIS — J9611 Chronic respiratory failure with hypoxia: Secondary | ICD-10-CM | POA: Diagnosis not present

## 2019-08-29 DIAGNOSIS — I110 Hypertensive heart disease with heart failure: Secondary | ICD-10-CM | POA: Diagnosis not present

## 2019-08-29 DIAGNOSIS — I5023 Acute on chronic systolic (congestive) heart failure: Secondary | ICD-10-CM | POA: Diagnosis not present

## 2019-08-29 NOTE — Assessment & Plan Note (Signed)
Documented in medical record system. Colonoscopy/EGD as planned. Patient believes she had a CT scan done, but all on file is [place] from outside facility. No concerning findings.

## 2019-08-29 NOTE — Assessment & Plan Note (Addendum)
Pleasant 85 year old female presenting with normocytic anemia, ferritin normal at 143, iron low at 34. Known CKD. Inpatient Sept 2020 with demand ischemia in setting of pneumonia and anemia, with aspirin on hold until GI evaluation complete. Chronic Coumadin for afib, history of DVT/PE in remote past, protein S deficiency. She is without any overt GI bleeding. Last colonoscopy approximately 5 years ago in [place] per patient; we will attempt to retrieve this. Vague early satiety noted, and weight loss of 45 pounds since 2017, with majority lost during this year.   Will need colonoscopy/EGD with Dr. Oneida Alar in the future. The risks, benefits, and alternatives have been discussed in detail with the patient. They state understanding and desire to proceed.  ON COUMADIN: Will hold 4 days prior and do Lovenox bridge. Reviewing with PCP. Update CBC

## 2019-08-30 DIAGNOSIS — I5023 Acute on chronic systolic (congestive) heart failure: Secondary | ICD-10-CM | POA: Diagnosis not present

## 2019-08-30 DIAGNOSIS — J441 Chronic obstructive pulmonary disease with (acute) exacerbation: Secondary | ICD-10-CM | POA: Diagnosis not present

## 2019-08-30 DIAGNOSIS — I252 Old myocardial infarction: Secondary | ICD-10-CM | POA: Diagnosis not present

## 2019-08-30 DIAGNOSIS — J9611 Chronic respiratory failure with hypoxia: Secondary | ICD-10-CM | POA: Diagnosis not present

## 2019-08-30 DIAGNOSIS — I110 Hypertensive heart disease with heart failure: Secondary | ICD-10-CM | POA: Diagnosis not present

## 2019-08-30 DIAGNOSIS — I2510 Atherosclerotic heart disease of native coronary artery without angina pectoris: Secondary | ICD-10-CM | POA: Diagnosis not present

## 2019-08-30 NOTE — Progress Notes (Signed)
cc'ed to pcp °

## 2019-08-31 DIAGNOSIS — I110 Hypertensive heart disease with heart failure: Secondary | ICD-10-CM | POA: Diagnosis not present

## 2019-08-31 DIAGNOSIS — J441 Chronic obstructive pulmonary disease with (acute) exacerbation: Secondary | ICD-10-CM | POA: Diagnosis not present

## 2019-08-31 DIAGNOSIS — J9611 Chronic respiratory failure with hypoxia: Secondary | ICD-10-CM | POA: Diagnosis not present

## 2019-08-31 DIAGNOSIS — I2510 Atherosclerotic heart disease of native coronary artery without angina pectoris: Secondary | ICD-10-CM | POA: Diagnosis not present

## 2019-08-31 DIAGNOSIS — I252 Old myocardial infarction: Secondary | ICD-10-CM | POA: Diagnosis not present

## 2019-08-31 DIAGNOSIS — I5023 Acute on chronic systolic (congestive) heart failure: Secondary | ICD-10-CM | POA: Diagnosis not present

## 2019-09-01 ENCOUNTER — Telehealth: Payer: Self-pay

## 2019-09-01 ENCOUNTER — Telehealth (INDEPENDENT_AMBULATORY_CARE_PROVIDER_SITE_OTHER): Payer: Medicare Other | Admitting: Cardiovascular Disease

## 2019-09-01 ENCOUNTER — Encounter: Payer: Self-pay | Admitting: Cardiovascular Disease

## 2019-09-01 VITALS — BP 115/56 | HR 59 | Ht 60.0 in | Wt 151.0 lb

## 2019-09-01 DIAGNOSIS — D6859 Other primary thrombophilia: Secondary | ICD-10-CM

## 2019-09-01 DIAGNOSIS — I5023 Acute on chronic systolic (congestive) heart failure: Secondary | ICD-10-CM | POA: Diagnosis not present

## 2019-09-01 DIAGNOSIS — J9611 Chronic respiratory failure with hypoxia: Secondary | ICD-10-CM | POA: Diagnosis not present

## 2019-09-01 DIAGNOSIS — I25118 Atherosclerotic heart disease of native coronary artery with other forms of angina pectoris: Secondary | ICD-10-CM

## 2019-09-01 DIAGNOSIS — I5022 Chronic systolic (congestive) heart failure: Secondary | ICD-10-CM | POA: Diagnosis not present

## 2019-09-01 DIAGNOSIS — I2510 Atherosclerotic heart disease of native coronary artery without angina pectoris: Secondary | ICD-10-CM | POA: Diagnosis not present

## 2019-09-01 DIAGNOSIS — I4821 Permanent atrial fibrillation: Secondary | ICD-10-CM

## 2019-09-01 DIAGNOSIS — I110 Hypertensive heart disease with heart failure: Secondary | ICD-10-CM | POA: Diagnosis not present

## 2019-09-01 DIAGNOSIS — I252 Old myocardial infarction: Secondary | ICD-10-CM | POA: Diagnosis not present

## 2019-09-01 DIAGNOSIS — I10 Essential (primary) hypertension: Secondary | ICD-10-CM

## 2019-09-01 DIAGNOSIS — D649 Anemia, unspecified: Secondary | ICD-10-CM

## 2019-09-01 DIAGNOSIS — Z86711 Personal history of pulmonary embolism: Secondary | ICD-10-CM

## 2019-09-01 DIAGNOSIS — J441 Chronic obstructive pulmonary disease with (acute) exacerbation: Secondary | ICD-10-CM | POA: Diagnosis not present

## 2019-09-01 NOTE — Patient Instructions (Addendum)
Medication Instructions:   Your physician recommends that you continue on your current medications as directed. Please refer to the Current Medication list given to you today.  Labwork:  NONE  Testing/Procedures:  NONE  Follow-Up:  Your physician recommends that you schedule a follow-up appointment in: 3 months.  Any Other Special Instructions Will Be Listed Below (If Applicable).  If you need a refill on your cardiac medications before your next appointment, please call your pharmacy. 

## 2019-09-01 NOTE — Progress Notes (Signed)
Virtual Visit via Telephone Note   This visit type was conducted due to national recommendations for restrictions regarding the COVID-19 Pandemic (e.g. social distancing) in an effort to limit this patient's exposure and mitigate transmission in our community.  Due to her co-morbid illnesses, this patient is at least at moderate risk for complications without adequate follow up.  This format is felt to be most appropriate for this patient at this time.  The patient did not have access to video technology/had technical difficulties with video requiring transitioning to audio format only (telephone).  All issues noted in this document were discussed and addressed.  No physical exam could be performed with this format.  Please refer to the patient's chart for her  consent to telehealth for [facility].   Date:  09/01/2019   ID:  Rebecca Hanson, DOB 08-27-1932, MRN [id number]  Patient Location: Home Provider Location: Office  PCP:  Glenda Chroman, MD  Cardiologist:  Kate Sable, MD  Electrophysiologist:  None   Evaluation Performed:  Follow-Up Visit  Chief Complaint:  CAD  History of Present Illness:    Rebecca Hanson is a 85 y.o. female with chronic systolic heart failure, permanent atrial fibrillation, and protein S deficiency with history of pulmonary embolism.  She was hospitalized for a non-STEMI in September 2020 underwent cardiac catheterization on 07/28/2019.  Details are reviewed below.  It was felt an initial trial of medical therapy would be best and if this failed then possible intervention could be considered.  She was also treated for atypical pneumonia and acute on chronic systolic heart failure.  She is anemic with a hemoglobin of 9.3 on 07/29/2019.  She has been scheduled to follow-up with GI.  She denies chest pain. She has occasional shortness of breath. O2 sats today 98-99%. She denies leg swelling.  She took a drive with her sisters on the [facility] over the weekend.   Past Medical History:  Diagnosis Date  . A-fib ([place])   . CHF (congestive heart failure) ([place])   . CKD (chronic kidney disease)   . Diabetes mellitus without complication ([place])   . Dyslipidemia   . HTN (hypertension)   . Polymyalgia rheumatica ([place])   . Protein S deficiency [facility])    Past Surgical History:  Procedure Laterality Date  . BREAST BIOPSY    . CATARACT EXTRACTION    . RIGHT/LEFT HEART CATH AND CORONARY ANGIOGRAPHY N/A 07/28/2019   Procedure: RIGHT/LEFT HEART CATH AND CORONARY ANGIOGRAPHY;  Surgeon: Troy Sine, MD;  Location: [place] [facility];  Service: Cardiovascular;  Laterality: N/A;     Current Meds  Medication Sig  . acetaminophen (TYLENOL) 325 MG tablet Take 650 mg by mouth every 4 (four) hours as needed (for pain).  Marland Kitchen atorvastatin (LIPITOR) 10 MG tablet Take 10 mg by mouth every evening.  . Calcium Carbonate (CALTRATE 600 PO) Take by mouth.  . carvedilol (COREG) 12.5 MG tablet Take 6.25 mg by mouth 2 (two) times daily.   . cetirizine (ALL DAY ALLERGY) 10 MG tablet Take 10 mg by mouth daily.   Marland Kitchen docusate sodium (COLACE) 100 MG capsule Take 100 mg by mouth 2 (two) times daily as needed for mild constipation.   . furosemide (LASIX) 20 MG tablet Take 1 tablet (20 mg total) by mouth daily.  Marland Kitchen glimepiride (AMARYL) 1 MG tablet Take 1 mg by mouth daily with breakfast.  . metFORMIN (GLUCOPHAGE) 500 MG tablet Take 500 mg by mouth 2 (two)  times daily.   . Multiple Vitamin (DAILY-VITE) TABS Take 1 tablet by mouth daily.  . potassium chloride SA (K-DUR) 10 MEQ tablet Take 1 tablet (10 mEq total) by mouth daily.  . sacubitril-valsartan (ENTRESTO) 24-26 MG Take 1 tablet by mouth 2 (two) times daily.  Marland Kitchen warfarin (COUMADIN) 4 MG tablet Take 1 tablet (4 mg total) by mouth daily for 5 days. Recheck INR 07/27/2019     Allergies:   Benadryl [diphenhydramine hcl (sleep)], Hand sanitizer [ethyl alcohol (skin cleanser)], Adhesive [tape], Penicillins,  Soap, and Streptomycin sulfate [streptomycin]   Social History   Tobacco Use  . Smoking status: Never Smoker  . Smokeless tobacco: Never Used  Substance Use Topics  . Alcohol use: No    Alcohol/week: 0.0 standard drinks  . Drug use: No     Family Hx: The patient's family history includes CAD in her father; CVA in her father; Colon polyps in her father and sister; Heart disease in her father. There is no history of Colon cancer.  ROS:   Please see the history of present illness.     All other systems reviewed and are negative.   Prior CV studies:   The following studies were reviewed today:  Cardiac catheterization 07/28/2019:   Ost LAD to Prox LAD lesion is 50% stenosed.  Prox LAD to Mid LAD lesion is 80% stenosed.  1st Sept lesion is 20% stenosed.  Prox Cx lesion is 80% stenosed.   Multivessel CAD with 50% ostial LAD stenosis followed by diffuse 80% proximal to mid LAD stenoses; 80% proximal circumflex stenoses, and a dominant RCA which gives rise to a conus branch that has an 85% ostial conus branch stenosis, and otherwise no significant obstruction in the RCA which supplies a large PDA and PLA vessel.  Mild right heart pressure elevation with PA systolic pressure at 39 mm.   Echocardiogram 07/25/2019:    1. The left ventricle has mild-moderately reduced systolic function, with an ejection fraction of 40-45%. The cavity size was normal. Left ventricular diastolic Doppler parameters are indeterminate. Left ventricular diffuse hypokinesis.  2. Normal RV size with mildly decreased systolic function.  3. There is mild mitral annular calcification present. Mitral valve regurgitation is mild to moderate by color flow Doppler. No evidence of mitral valve stenosis.  4. The aortic valve is tricuspid. Mild calcification of the aortic valve. No stenosis of the aortic valve.  5. The aortic root is normal in size and structure.  6. Left atrial size was severely dilated.  7.  Right atrial size was mildly dilated.  8. The inferior vena cava was dilated in size with <50% respiratory variability. PA systolic pressure 44 mmHg.  Labs/Other Tests and Data Reviewed:    EKG:  No ECG reviewed.  Recent Labs: 07/24/2019: ALT 12; TSH 0.661 07/26/2019: Magnesium 2.0 07/27/2019: B Natriuretic Peptide 176.8 07/29/2019: BUN 23; Creatinine, Ser 1.06; Hemoglobin 9.3; Platelets 191; Potassium 4.8; Sodium 137   Recent Lipid Panel Lab Results  Component Value Date/Time   CHOL 90 07/26/2019 06:20 AM   TRIG 71 07/26/2019 06:20 AM   HDL 27 (L) 07/26/2019 06:20 AM   CHOLHDL 3.3 07/26/2019 06:20 AM   LDLCALC 49 07/26/2019 06:20 AM    Wt Readings from Last 3 Encounters:  09/01/19 151 lb (68.5 kg)  08/24/19 150 lb (68 kg)  07/29/19 155 lb 10.3 oz (70.6 kg)     Objective:    Vital Signs:  BP (!) 115/56   Pulse (!) 59  Ht 5' (1.524 m)   Wt 151 lb (68.5 kg)   BMI 29.49 kg/m    VITAL SIGNS:  reviewed  ASSESSMENT & PLAN:    1.  Chronic systolic heart failure:  She does not endorse symptoms of hypervolemia.  LVEF 40 to 45% by echocardiogram in September 2020.  Continue carvedilol and Entresto.  She developed relative hypotension, weakness, and dizziness when taking 49-51 mg twice daily.  Continue Lasix 20 mg daily.  NYHA class II symptoms.  2.Permanentatrial fibrillation: Anticoagulated with warfarin.INR monitored by PCP. HR controlled with Coreg.  3. Essential HTN: BP is normal.  No change to therapy.  4. Pulmonary embolism/protein S deficiency: Continue warfarin.INR monitored by PCP.  5.  Coronary artery disease/NSTEMI: Cardiac catheterization from September 2020 reviewed above.  It was felt an initial trial of medical therapy would be best.  Continue carvedilol and atorvastatin.  No aspirin as she is on warfarin.  6.  Anemia: She is anemic with a hemoglobin of 9.3 on 07/29/2019.  She has been scheduled to follow-up with GI.   COVID-19 Education: The  signs and symptoms of COVID-19 were discussed with the patient and how to seek care for testing (follow up with PCP or arrange E-visit).  The importance of social distancing was discussed today.  Time:   Today, I have spent 15 minutes with the patient with telehealth technology discussing the above problems.     Medication Adjustments/Labs and Tests Ordered: Current medicines are reviewed at length with the patient today.  Concerns regarding medicines are outlined above.   Tests Ordered: No orders of the defined types were placed in this encounter.   Medication Changes: No orders of the defined types were placed in this encounter.   Follow Up:  Either In Person or Virtual in 3 month(s)  Signed, Prentice Docker, MD  09/01/2019 1:01 PM    [facility]

## 2019-09-01 NOTE — Telephone Encounter (Signed)
I have faxed request to Dr. Woody Seller for advice on holding coumadin x 4 days prior to procedures and doing a Lovenox bridge.

## 2019-09-05 DIAGNOSIS — I5023 Acute on chronic systolic (congestive) heart failure: Secondary | ICD-10-CM | POA: Diagnosis not present

## 2019-09-05 DIAGNOSIS — I2510 Atherosclerotic heart disease of native coronary artery without angina pectoris: Secondary | ICD-10-CM | POA: Diagnosis not present

## 2019-09-05 DIAGNOSIS — J441 Chronic obstructive pulmonary disease with (acute) exacerbation: Secondary | ICD-10-CM | POA: Diagnosis not present

## 2019-09-05 DIAGNOSIS — I252 Old myocardial infarction: Secondary | ICD-10-CM | POA: Diagnosis not present

## 2019-09-05 DIAGNOSIS — I110 Hypertensive heart disease with heart failure: Secondary | ICD-10-CM | POA: Diagnosis not present

## 2019-09-05 DIAGNOSIS — Z7901 Long term (current) use of anticoagulants: Secondary | ICD-10-CM | POA: Diagnosis not present

## 2019-09-05 DIAGNOSIS — J9611 Chronic respiratory failure with hypoxia: Secondary | ICD-10-CM | POA: Diagnosis not present

## 2019-09-05 NOTE — Telephone Encounter (Signed)
Received a note back from Dr. Woody Seller: Madaline Brilliant to hold coumadin x 4 days for procedure and do a Lovenox bridge.  Forwarding to [employer], [place] and placing note in her box.

## 2019-09-07 DIAGNOSIS — I252 Old myocardial infarction: Secondary | ICD-10-CM | POA: Diagnosis not present

## 2019-09-07 DIAGNOSIS — I2510 Atherosclerotic heart disease of native coronary artery without angina pectoris: Secondary | ICD-10-CM | POA: Diagnosis not present

## 2019-09-07 DIAGNOSIS — I110 Hypertensive heart disease with heart failure: Secondary | ICD-10-CM | POA: Diagnosis not present

## 2019-09-07 DIAGNOSIS — J441 Chronic obstructive pulmonary disease with (acute) exacerbation: Secondary | ICD-10-CM | POA: Diagnosis not present

## 2019-09-07 DIAGNOSIS — I5023 Acute on chronic systolic (congestive) heart failure: Secondary | ICD-10-CM | POA: Diagnosis not present

## 2019-09-07 DIAGNOSIS — J9611 Chronic respiratory failure with hypoxia: Secondary | ICD-10-CM | POA: Diagnosis not present

## 2019-09-08 DIAGNOSIS — J441 Chronic obstructive pulmonary disease with (acute) exacerbation: Secondary | ICD-10-CM | POA: Diagnosis not present

## 2019-09-08 DIAGNOSIS — J9611 Chronic respiratory failure with hypoxia: Secondary | ICD-10-CM | POA: Diagnosis not present

## 2019-09-08 DIAGNOSIS — I2510 Atherosclerotic heart disease of native coronary artery without angina pectoris: Secondary | ICD-10-CM | POA: Diagnosis not present

## 2019-09-08 DIAGNOSIS — I110 Hypertensive heart disease with heart failure: Secondary | ICD-10-CM | POA: Diagnosis not present

## 2019-09-08 DIAGNOSIS — I252 Old myocardial infarction: Secondary | ICD-10-CM | POA: Diagnosis not present

## 2019-09-08 DIAGNOSIS — I5023 Acute on chronic systolic (congestive) heart failure: Secondary | ICD-10-CM | POA: Diagnosis not present

## 2019-09-08 NOTE — Progress Notes (Signed)
Office Visit Note  Patient: Rebecca Hanson             Date of Birth: [birth year]           MRN: [id number]             PCP: Ignatius Specking, MD Referring: Ignatius Specking, MD Visit Date: 09/22/2019 Occupation: @GUAROCC @  Subjective:  Pain in both knee joints   History of Present Illness: Rebecca Hanson is a 85 y.o. female with history of polymyalgia rheumatica and osteoarthritis.  She states she is having pain in "every single joint."  She states she has intermittent swelling in both hands and both knee joints.  She had cortisone injections in both knee joints 1-2 weeks ago by Dr. Case, which did not provide any pain relief.  She states she is primarily in a wheelchair, but she gets up 4 times a day to walk.  She has not been taking any OTC analgesics.    Activities of Daily Living:  Patient reports joint stiffness all day  Patient Denies nocturnal pain.  Difficulty dressing/grooming: Reports Difficulty climbing stairs: Reports Difficulty getting out of chair: Reports Difficulty using hands for taps, buttons, cutlery, and/or writing: Denies  Review of Systems  Constitutional: Positive for fatigue.  HENT: Positive for mouth dryness. Negative for mouth sores and nose dryness.   Eyes: Negative for pain, visual disturbance and dryness.  Respiratory: Negative for cough, hemoptysis, shortness of breath and difficulty breathing.   Cardiovascular: Positive for swelling in legs/feet. Negative for chest pain, palpitations and hypertension.  Gastrointestinal: Negative for blood in stool, constipation and diarrhea.  Endocrine: Negative for excessive thirst and increased urination.  Genitourinary: Negative for difficulty urinating and painful urination.  Musculoskeletal: Positive for arthralgias, gait problem, joint pain, joint swelling, muscle weakness, morning stiffness and muscle tenderness. Negative for myalgias and myalgias.  Skin: Negative for color change, pallor, rash, hair loss,  nodules/bumps, skin tightness, ulcers and sensitivity to sunlight.  Allergic/Immunologic: Negative for susceptible to infections.  Neurological: Negative for numbness, headaches and weakness.  Hematological: Negative for bruising/bleeding tendency and swollen glands.  Psychiatric/Behavioral: Negative for depressed mood and sleep disturbance. The patient is not nervous/anxious.     PMFS History:  Patient Active Problem List   Diagnosis Date Noted   Normocytic anemia 08/24/2019   Loss of weight 08/24/2019   Elevated troponin 07/25/2019   Chest pain 07/25/2019   Dyspnea 07/25/2019   SOB (shortness of breath)    DCM (dilated cardiomyopathy) (HCC)    Nonrheumatic mitral valve regurgitation    Polymyalgia rheumatica (HCC) 11/19/2016   Primary osteoarthritis of both knees 11/19/2016   Primary osteoarthritis of both hands 11/19/2016   Pseudogout 11/19/2016   Essential hypertension 11/19/2016   Diabetes mellitus 11/19/2016   History of DVT (deep vein thrombosis) 11/19/2016   History of pulmonary embolism 11/19/2016   History of congestive heart failure 11/19/2016    Past Medical History:  Diagnosis Date   A-fib (HCC)    CHF (congestive heart failure) (HCC)    CKD (chronic kidney disease)    Diabetes mellitus without complication (HCC)    Dyslipidemia    HTN (hypertension)    Polymyalgia rheumatica (HCC)    Protein S deficiency (HCC)     Family History  Problem Relation Age of Onset   Heart disease Father    CAD Father    CVA Father    Colon polyps Father        > 83 years old  Colon polyps Sister        > 33 years old    Colon cancer Neg Hx    Past Surgical History:  Procedure Laterality Date   BREAST BIOPSY     CATARACT EXTRACTION     RIGHT/LEFT HEART CATH AND CORONARY ANGIOGRAPHY N/A 07/28/2019   Procedure: RIGHT/LEFT HEART CATH AND CORONARY ANGIOGRAPHY;  Surgeon: Troy Sine, MD;  Location: [facility];  Service:  Cardiovascular;  Laterality: N/A;   Social History   Social History Narrative   Not on file   Immunization History  Administered Date(s) Administered   Fluad Quad(high Dose 65+) 07/26/2019     Objective: Vital Signs: BP 132/74 (BP Location: Left Arm, Patient Position: Sitting, Cuff Size: Normal)    Pulse 65    Resp 20    Ht 5' (1.524 m)    Wt 162 lb 3.2 oz (73.6 kg)    BMI 31.68 kg/m    Physical Exam Vitals signs and nursing note reviewed.  Constitutional:      Appearance: She is well-developed.  HENT:     Head: Normocephalic and atraumatic.  Eyes:     Conjunctiva/sclera: Conjunctivae normal.  Neck:     Musculoskeletal: Normal range of motion.  Cardiovascular:     Rate and Rhythm: Normal rate and regular rhythm.     Heart sounds: Normal heart sounds.  Pulmonary:     Effort: Pulmonary effort is normal.     Breath sounds: Normal breath sounds.  Abdominal:     General: Bowel sounds are normal.     Palpations: Abdomen is soft.  Lymphadenopathy:     Cervical: No cervical adenopathy.  Skin:    General: Skin is warm and dry.     Capillary Refill: Capillary refill takes less than 2 seconds.  Neurological:     Mental Status: She is alert and oriented to person, place, and time.  Psychiatric:        Behavior: Behavior normal.      Musculoskeletal Exam: C-spine limited ROM.  Shoulder joint abduction to about 90 degrees bilaterally.  Elbow joint contractures noted.  Limited ROM of both wrist joints with discomfort. PIP and DIP synovial thickening consistent with osteoarthritis of both hands. CMC joint synovial thickening bilaterally. Hip joints difficult to assess in wheelchair.  Knee joints good ROM with discomfort bilaterally.  No warmth or effusion of knee joints. Tenderness of both ankle joints.  Pedal edema bilaterally.   CDAI Exam: CDAI Score: -- Patient Global: --; Provider Global: -- Swollen: --; Tender: -- Joint Exam   No joint exam has been documented for this  visit   There is currently no information documented on the homunculus. Go to the Rheumatology activity and complete the homunculus joint exam.  Investigation: No additional findings.  Imaging: No results found.  Recent Labs: Lab Results  Component Value Date   WBC 7.4 07/29/2019   HGB 9.3 (L) 07/29/2019   PLT 191 07/29/2019   NA 137 07/29/2019   K 4.8 07/29/2019   CL 106 07/29/2019   CO2 24 07/29/2019   GLUCOSE 148 (H) 07/29/2019   BUN 23 07/29/2019   CREATININE 1.06 (H) 07/29/2019   BILITOT 0.6 07/24/2019   ALKPHOS 49 07/24/2019   AST 26 07/24/2019   ALT 12 07/24/2019   PROT 5.4 (L) 07/24/2019   ALBUMIN 2.7 (L) 07/24/2019   CALCIUM 8.8 (L) 07/29/2019   GFRAA 55 (L) 07/29/2019    Speciality Comments: No specialty comments available.  Procedures:  No procedures performed Allergies: Benadryl [diphenhydramine hcl (sleep)], Hand sanitizer [ethyl alcohol (skin cleanser)], Adhesive [tape], Other, Penicillins, Soap, and Streptomycin sulfate [streptomycin]   Assessment / Plan:     Visit Diagnoses: Polymyalgia rheumatica (HCC): She is not experiencing any signs or symptoms of a flare at this time.  She is not taking prednisone or any immunosuppressive agents at this time.  She is primarily in a wheelchair.  She has chronic pain in both shoulder joints, both hands, and both knee joints due to osteoarthritis.  She has no muscle weakness or increased muscle tenderness.  She was advised to notify us if she develops signs of a flare.  She will follow up in 1 year.  Primary osteoarthritis of both hands: She has PIP and DIP synovial thickening consistent with osteoarthritis of both hands.  She has bilateral CMC joint synovial thickening.  Subluxation of several DIP joints noted.  No synovitis noted.  Joint protection and muscle strengthening were discussed.  We discussed using arthritis gloves for compression and support.    Primary osteoarthritis of both knees:  She has chronic pain in  both knee joints.  She has good ROM with discomfort bilaterally.  No warmth or effusion noted. She has bilateral knee joint cortisone injections performed by Dr. Case 1-2 weeks ago according to the patient.  She had no pain relief after the cortisone injections.  X-rays of both knee joints were obtained on 09/22/18, which revealed severe osteoarthritis of both knee joints.   We discussed using knee joint compression sleeves.  She is primarily in a wheelchair, but she gets up 4 times daily to walk.  She can take tylenol as needed for pain relief.   Pseudogout: She has not had any recent flares.  Other medical conditions are listed as follows;   History of congestive heart failure  History of DVT (deep vein thrombosis)  History of diabetes mellitus  History of hypertension  History of pulmonary embolism  Orders: No orders of the defined types were placed in this encounter.  No orders of the defined types were placed in this encounter.     Follow-Up Instructions: Return in about 1 year (around 09/21/2020) for Polymyalgia Rheumatica, Osteoarthritis.   Gearldine Bienenstockaylor M Elizjah Noblet, PA-C   I examined and evaluated the patient with Sherron Alesaylor Daysie Helf PA.  Patient had no synovitis on examination.  Detailed counseling guarding pain management was provided.  The plan of care was discussed as noted above.  Pollyann SavoyShaili Deveshwar, MD  Note - This record has been created using Animal nutritionistDragon software.  Chart creation errors have been sought, but may not always  have been located. Such creation errors do not reflect on  the standard of medical care.

## 2019-09-12 DIAGNOSIS — I110 Hypertensive heart disease with heart failure: Secondary | ICD-10-CM | POA: Diagnosis not present

## 2019-09-12 DIAGNOSIS — J441 Chronic obstructive pulmonary disease with (acute) exacerbation: Secondary | ICD-10-CM | POA: Diagnosis not present

## 2019-09-12 DIAGNOSIS — I5023 Acute on chronic systolic (congestive) heart failure: Secondary | ICD-10-CM | POA: Diagnosis not present

## 2019-09-12 DIAGNOSIS — I2510 Atherosclerotic heart disease of native coronary artery without angina pectoris: Secondary | ICD-10-CM | POA: Diagnosis not present

## 2019-09-12 DIAGNOSIS — J9611 Chronic respiratory failure with hypoxia: Secondary | ICD-10-CM | POA: Diagnosis not present

## 2019-09-12 DIAGNOSIS — I252 Old myocardial infarction: Secondary | ICD-10-CM | POA: Diagnosis not present

## 2019-09-14 DIAGNOSIS — Z86711 Personal history of pulmonary embolism: Secondary | ICD-10-CM | POA: Diagnosis not present

## 2019-09-14 DIAGNOSIS — I252 Old myocardial infarction: Secondary | ICD-10-CM | POA: Diagnosis not present

## 2019-09-14 DIAGNOSIS — E119 Type 2 diabetes mellitus without complications: Secondary | ICD-10-CM | POA: Diagnosis not present

## 2019-09-14 DIAGNOSIS — Z86718 Personal history of other venous thrombosis and embolism: Secondary | ICD-10-CM | POA: Diagnosis not present

## 2019-09-14 DIAGNOSIS — Z9981 Dependence on supplemental oxygen: Secondary | ICD-10-CM | POA: Diagnosis not present

## 2019-09-14 DIAGNOSIS — I4821 Permanent atrial fibrillation: Secondary | ICD-10-CM | POA: Diagnosis not present

## 2019-09-14 DIAGNOSIS — J9611 Chronic respiratory failure with hypoxia: Secondary | ICD-10-CM | POA: Diagnosis not present

## 2019-09-14 DIAGNOSIS — Z7984 Long term (current) use of oral hypoglycemic drugs: Secondary | ICD-10-CM | POA: Diagnosis not present

## 2019-09-14 DIAGNOSIS — J441 Chronic obstructive pulmonary disease with (acute) exacerbation: Secondary | ICD-10-CM | POA: Diagnosis not present

## 2019-09-14 DIAGNOSIS — Z7901 Long term (current) use of anticoagulants: Secondary | ICD-10-CM | POA: Diagnosis not present

## 2019-09-14 DIAGNOSIS — I2510 Atherosclerotic heart disease of native coronary artery without angina pectoris: Secondary | ICD-10-CM | POA: Diagnosis not present

## 2019-09-14 DIAGNOSIS — D6859 Other primary thrombophilia: Secondary | ICD-10-CM | POA: Diagnosis not present

## 2019-09-14 DIAGNOSIS — I420 Dilated cardiomyopathy: Secondary | ICD-10-CM | POA: Diagnosis not present

## 2019-09-14 DIAGNOSIS — I5023 Acute on chronic systolic (congestive) heart failure: Secondary | ICD-10-CM | POA: Diagnosis not present

## 2019-09-14 DIAGNOSIS — Z87440 Personal history of urinary (tract) infections: Secondary | ICD-10-CM | POA: Diagnosis not present

## 2019-09-14 DIAGNOSIS — I340 Nonrheumatic mitral (valve) insufficiency: Secondary | ICD-10-CM | POA: Diagnosis not present

## 2019-09-14 DIAGNOSIS — I110 Hypertensive heart disease with heart failure: Secondary | ICD-10-CM | POA: Diagnosis not present

## 2019-09-15 DIAGNOSIS — M170 Bilateral primary osteoarthritis of knee: Secondary | ICD-10-CM | POA: Diagnosis not present

## 2019-09-15 NOTE — Telephone Encounter (Signed)
Called pt, NA and unable to leave a message.

## 2019-09-15 NOTE — Telephone Encounter (Signed)
Please arrange TCS/EGD with Dr. Oneida Alar due to anemia and weight loss.  I need to know the date so we can have her hold Coumadin prior and do lovenox bridge.

## 2019-09-15 NOTE — Telephone Encounter (Signed)
Please clarify on encounter form it states propofol but message below is for conscious sedation. Thanks

## 2019-09-16 NOTE — Telephone Encounter (Signed)
Called pt, NA, unable to leave VM. Will mail letter to call to schedule.

## 2019-09-16 NOTE — Telephone Encounter (Signed)
Yes, please with Propofol, thanks!

## 2019-09-19 DIAGNOSIS — J9611 Chronic respiratory failure with hypoxia: Secondary | ICD-10-CM | POA: Diagnosis not present

## 2019-09-19 DIAGNOSIS — I110 Hypertensive heart disease with heart failure: Secondary | ICD-10-CM | POA: Diagnosis not present

## 2019-09-19 DIAGNOSIS — I2510 Atherosclerotic heart disease of native coronary artery without angina pectoris: Secondary | ICD-10-CM | POA: Diagnosis not present

## 2019-09-19 DIAGNOSIS — I252 Old myocardial infarction: Secondary | ICD-10-CM | POA: Diagnosis not present

## 2019-09-19 DIAGNOSIS — I5023 Acute on chronic systolic (congestive) heart failure: Secondary | ICD-10-CM | POA: Diagnosis not present

## 2019-09-19 DIAGNOSIS — J441 Chronic obstructive pulmonary disease with (acute) exacerbation: Secondary | ICD-10-CM | POA: Diagnosis not present

## 2019-09-21 ENCOUNTER — Other Ambulatory Visit: Payer: Self-pay | Admitting: Cardiovascular Disease

## 2019-09-21 DIAGNOSIS — I2510 Atherosclerotic heart disease of native coronary artery without angina pectoris: Secondary | ICD-10-CM | POA: Diagnosis not present

## 2019-09-21 DIAGNOSIS — I5023 Acute on chronic systolic (congestive) heart failure: Secondary | ICD-10-CM | POA: Diagnosis not present

## 2019-09-21 DIAGNOSIS — I252 Old myocardial infarction: Secondary | ICD-10-CM | POA: Diagnosis not present

## 2019-09-21 DIAGNOSIS — I110 Hypertensive heart disease with heart failure: Secondary | ICD-10-CM | POA: Diagnosis not present

## 2019-09-21 DIAGNOSIS — J9611 Chronic respiratory failure with hypoxia: Secondary | ICD-10-CM | POA: Diagnosis not present

## 2019-09-21 DIAGNOSIS — J441 Chronic obstructive pulmonary disease with (acute) exacerbation: Secondary | ICD-10-CM | POA: Diagnosis not present

## 2019-09-22 ENCOUNTER — Other Ambulatory Visit: Payer: Self-pay

## 2019-09-22 ENCOUNTER — Ambulatory Visit (INDEPENDENT_AMBULATORY_CARE_PROVIDER_SITE_OTHER): Payer: Medicare Other | Admitting: Rheumatology

## 2019-09-22 ENCOUNTER — Encounter: Payer: Self-pay | Admitting: Rheumatology

## 2019-09-22 VITALS — BP 132/74 | HR 65 | Resp 20 | Ht 60.0 in | Wt 162.2 lb

## 2019-09-22 DIAGNOSIS — I25118 Atherosclerotic heart disease of native coronary artery with other forms of angina pectoris: Secondary | ICD-10-CM

## 2019-09-22 DIAGNOSIS — M19041 Primary osteoarthritis, right hand: Secondary | ICD-10-CM | POA: Diagnosis not present

## 2019-09-22 DIAGNOSIS — Z8639 Personal history of other endocrine, nutritional and metabolic disease: Secondary | ICD-10-CM

## 2019-09-22 DIAGNOSIS — M1120 Other chondrocalcinosis, unspecified site: Secondary | ICD-10-CM

## 2019-09-22 DIAGNOSIS — Z8679 Personal history of other diseases of the circulatory system: Secondary | ICD-10-CM | POA: Diagnosis not present

## 2019-09-22 DIAGNOSIS — Z86711 Personal history of pulmonary embolism: Secondary | ICD-10-CM | POA: Diagnosis not present

## 2019-09-22 DIAGNOSIS — M353 Polymyalgia rheumatica: Secondary | ICD-10-CM

## 2019-09-22 DIAGNOSIS — M19042 Primary osteoarthritis, left hand: Secondary | ICD-10-CM

## 2019-09-22 DIAGNOSIS — Z86718 Personal history of other venous thrombosis and embolism: Secondary | ICD-10-CM | POA: Diagnosis not present

## 2019-09-22 DIAGNOSIS — M170 Bilateral primary osteoarthritis of knee: Secondary | ICD-10-CM | POA: Diagnosis not present

## 2019-09-26 ENCOUNTER — Telehealth: Payer: Self-pay | Admitting: Gastroenterology

## 2019-09-26 DIAGNOSIS — J9611 Chronic respiratory failure with hypoxia: Secondary | ICD-10-CM | POA: Diagnosis not present

## 2019-09-26 DIAGNOSIS — I110 Hypertensive heart disease with heart failure: Secondary | ICD-10-CM | POA: Diagnosis not present

## 2019-09-26 DIAGNOSIS — I5023 Acute on chronic systolic (congestive) heart failure: Secondary | ICD-10-CM | POA: Diagnosis not present

## 2019-09-26 DIAGNOSIS — I252 Old myocardial infarction: Secondary | ICD-10-CM | POA: Diagnosis not present

## 2019-09-26 DIAGNOSIS — I2510 Atherosclerotic heart disease of native coronary artery without angina pectoris: Secondary | ICD-10-CM | POA: Diagnosis not present

## 2019-09-26 DIAGNOSIS — J441 Chronic obstructive pulmonary disease with (acute) exacerbation: Secondary | ICD-10-CM | POA: Diagnosis not present

## 2019-09-26 NOTE — Telephone Encounter (Signed)
Called pt x3 and line busy. We will call once we receive march schedule to schedule for procedures

## 2019-09-26 NOTE — Telephone Encounter (Signed)
SOMEONE CALLED TO SCHEDULE PATIENT FOR HER PROCEDURES   I TOLD HER SOMEONE WOULD BE BACK IN [place] [phone]

## 2019-09-28 DIAGNOSIS — I5023 Acute on chronic systolic (congestive) heart failure: Secondary | ICD-10-CM | POA: Diagnosis not present

## 2019-09-28 DIAGNOSIS — I252 Old myocardial infarction: Secondary | ICD-10-CM | POA: Diagnosis not present

## 2019-09-28 DIAGNOSIS — I110 Hypertensive heart disease with heart failure: Secondary | ICD-10-CM | POA: Diagnosis not present

## 2019-09-28 DIAGNOSIS — J9611 Chronic respiratory failure with hypoxia: Secondary | ICD-10-CM | POA: Diagnosis not present

## 2019-09-28 DIAGNOSIS — I2510 Atherosclerotic heart disease of native coronary artery without angina pectoris: Secondary | ICD-10-CM | POA: Diagnosis not present

## 2019-09-28 DIAGNOSIS — J441 Chronic obstructive pulmonary disease with (acute) exacerbation: Secondary | ICD-10-CM | POA: Diagnosis not present

## 2019-10-10 ENCOUNTER — Other Ambulatory Visit: Payer: Self-pay | Admitting: *Deleted

## 2019-10-10 DIAGNOSIS — I110 Hypertensive heart disease with heart failure: Secondary | ICD-10-CM | POA: Diagnosis not present

## 2019-10-10 DIAGNOSIS — R634 Abnormal weight loss: Secondary | ICD-10-CM

## 2019-10-10 DIAGNOSIS — J441 Chronic obstructive pulmonary disease with (acute) exacerbation: Secondary | ICD-10-CM | POA: Diagnosis not present

## 2019-10-10 DIAGNOSIS — I252 Old myocardial infarction: Secondary | ICD-10-CM | POA: Diagnosis not present

## 2019-10-10 DIAGNOSIS — J9611 Chronic respiratory failure with hypoxia: Secondary | ICD-10-CM | POA: Diagnosis not present

## 2019-10-10 DIAGNOSIS — I5023 Acute on chronic systolic (congestive) heart failure: Secondary | ICD-10-CM | POA: Diagnosis not present

## 2019-10-10 DIAGNOSIS — I2510 Atherosclerotic heart disease of native coronary artery without angina pectoris: Secondary | ICD-10-CM | POA: Diagnosis not present

## 2019-10-10 DIAGNOSIS — D649 Anemia, unspecified: Secondary | ICD-10-CM

## 2019-10-10 MED ORDER — NA SULFATE-K SULFATE-MG SULF 17.5-3.13-1.6 GM/177ML PO SOLN: 1.0000 | Freq: Once | ORAL | 0 refills | Status: AC

## 2019-10-10 NOTE — Telephone Encounter (Signed)
Called pt, spoke with state health caregiver. Patient scheduled for 3/2 at 10:30am for procedures. Aware I will mail prep instructions with covid/pre-op appt, orders entered. Rx sent in. Fowarding to [facility] regarding coumadin and lovenox.

## 2019-10-12 DIAGNOSIS — I252 Old myocardial infarction: Secondary | ICD-10-CM | POA: Diagnosis not present

## 2019-10-12 DIAGNOSIS — I5023 Acute on chronic systolic (congestive) heart failure: Secondary | ICD-10-CM | POA: Diagnosis not present

## 2019-10-12 DIAGNOSIS — I110 Hypertensive heart disease with heart failure: Secondary | ICD-10-CM | POA: Diagnosis not present

## 2019-10-12 DIAGNOSIS — J9611 Chronic respiratory failure with hypoxia: Secondary | ICD-10-CM | POA: Diagnosis not present

## 2019-10-12 DIAGNOSIS — J441 Chronic obstructive pulmonary disease with (acute) exacerbation: Secondary | ICD-10-CM | POA: Diagnosis not present

## 2019-10-12 DIAGNOSIS — I2510 Atherosclerotic heart disease of native coronary artery without angina pectoris: Secondary | ICD-10-CM | POA: Diagnosis not present

## 2019-10-14 DIAGNOSIS — I252 Old myocardial infarction: Secondary | ICD-10-CM | POA: Diagnosis not present

## 2019-10-14 DIAGNOSIS — I2510 Atherosclerotic heart disease of native coronary artery without angina pectoris: Secondary | ICD-10-CM | POA: Diagnosis not present

## 2019-10-14 DIAGNOSIS — Z7984 Long term (current) use of oral hypoglycemic drugs: Secondary | ICD-10-CM | POA: Diagnosis not present

## 2019-10-14 DIAGNOSIS — E119 Type 2 diabetes mellitus without complications: Secondary | ICD-10-CM | POA: Diagnosis not present

## 2019-10-14 DIAGNOSIS — J441 Chronic obstructive pulmonary disease with (acute) exacerbation: Secondary | ICD-10-CM | POA: Diagnosis not present

## 2019-10-14 DIAGNOSIS — I420 Dilated cardiomyopathy: Secondary | ICD-10-CM | POA: Diagnosis not present

## 2019-10-14 DIAGNOSIS — Z7901 Long term (current) use of anticoagulants: Secondary | ICD-10-CM | POA: Diagnosis not present

## 2019-10-14 DIAGNOSIS — I110 Hypertensive heart disease with heart failure: Secondary | ICD-10-CM | POA: Diagnosis not present

## 2019-10-14 DIAGNOSIS — D6859 Other primary thrombophilia: Secondary | ICD-10-CM | POA: Diagnosis not present

## 2019-10-14 DIAGNOSIS — Z86718 Personal history of other venous thrombosis and embolism: Secondary | ICD-10-CM | POA: Diagnosis not present

## 2019-10-14 DIAGNOSIS — Z86711 Personal history of pulmonary embolism: Secondary | ICD-10-CM | POA: Diagnosis not present

## 2019-10-14 DIAGNOSIS — I5023 Acute on chronic systolic (congestive) heart failure: Secondary | ICD-10-CM | POA: Diagnosis not present

## 2019-10-14 DIAGNOSIS — I340 Nonrheumatic mitral (valve) insufficiency: Secondary | ICD-10-CM | POA: Diagnosis not present

## 2019-10-14 DIAGNOSIS — I4821 Permanent atrial fibrillation: Secondary | ICD-10-CM | POA: Diagnosis not present

## 2019-10-14 DIAGNOSIS — J9611 Chronic respiratory failure with hypoxia: Secondary | ICD-10-CM | POA: Diagnosis not present

## 2019-10-14 DIAGNOSIS — Z87440 Personal history of urinary (tract) infections: Secondary | ICD-10-CM | POA: Diagnosis not present

## 2019-10-17 DIAGNOSIS — I252 Old myocardial infarction: Secondary | ICD-10-CM | POA: Diagnosis not present

## 2019-10-17 DIAGNOSIS — I110 Hypertensive heart disease with heart failure: Secondary | ICD-10-CM | POA: Diagnosis not present

## 2019-10-17 DIAGNOSIS — J9611 Chronic respiratory failure with hypoxia: Secondary | ICD-10-CM | POA: Diagnosis not present

## 2019-10-17 DIAGNOSIS — I2510 Atherosclerotic heart disease of native coronary artery without angina pectoris: Secondary | ICD-10-CM | POA: Diagnosis not present

## 2019-10-17 DIAGNOSIS — J441 Chronic obstructive pulmonary disease with (acute) exacerbation: Secondary | ICD-10-CM | POA: Diagnosis not present

## 2019-10-17 DIAGNOSIS — I5023 Acute on chronic systolic (congestive) heart failure: Secondary | ICD-10-CM | POA: Diagnosis not present

## 2019-10-18 NOTE — Addendum Note (Signed)
Addended by: Annitta Needs on: 10/18/2019 03:47 PM   Modules accepted: Orders

## 2019-10-19 DIAGNOSIS — J441 Chronic obstructive pulmonary disease with (acute) exacerbation: Secondary | ICD-10-CM | POA: Diagnosis not present

## 2019-10-19 DIAGNOSIS — I110 Hypertensive heart disease with heart failure: Secondary | ICD-10-CM | POA: Diagnosis not present

## 2019-10-19 DIAGNOSIS — M170 Bilateral primary osteoarthritis of knee: Secondary | ICD-10-CM | POA: Diagnosis not present

## 2019-10-19 DIAGNOSIS — I2510 Atherosclerotic heart disease of native coronary artery without angina pectoris: Secondary | ICD-10-CM | POA: Diagnosis not present

## 2019-10-19 DIAGNOSIS — I5023 Acute on chronic systolic (congestive) heart failure: Secondary | ICD-10-CM | POA: Diagnosis not present

## 2019-10-19 DIAGNOSIS — J9611 Chronic respiratory failure with hypoxia: Secondary | ICD-10-CM | POA: Diagnosis not present

## 2019-10-19 DIAGNOSIS — I252 Old myocardial infarction: Secondary | ICD-10-CM | POA: Diagnosis not present

## 2019-10-20 DIAGNOSIS — I2510 Atherosclerotic heart disease of native coronary artery without angina pectoris: Secondary | ICD-10-CM | POA: Diagnosis not present

## 2019-10-20 DIAGNOSIS — I110 Hypertensive heart disease with heart failure: Secondary | ICD-10-CM | POA: Diagnosis not present

## 2019-10-20 DIAGNOSIS — J9611 Chronic respiratory failure with hypoxia: Secondary | ICD-10-CM | POA: Diagnosis not present

## 2019-10-20 DIAGNOSIS — I5023 Acute on chronic systolic (congestive) heart failure: Secondary | ICD-10-CM | POA: Diagnosis not present

## 2019-10-20 DIAGNOSIS — J441 Chronic obstructive pulmonary disease with (acute) exacerbation: Secondary | ICD-10-CM | POA: Diagnosis not present

## 2019-10-20 DIAGNOSIS — I252 Old myocardial infarction: Secondary | ICD-10-CM | POA: Diagnosis not present

## 2019-10-21 DIAGNOSIS — J9611 Chronic respiratory failure with hypoxia: Secondary | ICD-10-CM | POA: Diagnosis not present

## 2019-10-21 DIAGNOSIS — J441 Chronic obstructive pulmonary disease with (acute) exacerbation: Secondary | ICD-10-CM | POA: Diagnosis not present

## 2019-10-21 DIAGNOSIS — I5023 Acute on chronic systolic (congestive) heart failure: Secondary | ICD-10-CM | POA: Diagnosis not present

## 2019-10-21 DIAGNOSIS — I110 Hypertensive heart disease with heart failure: Secondary | ICD-10-CM | POA: Diagnosis not present

## 2019-10-21 DIAGNOSIS — I252 Old myocardial infarction: Secondary | ICD-10-CM | POA: Diagnosis not present

## 2019-10-21 DIAGNOSIS — I2510 Atherosclerotic heart disease of native coronary artery without angina pectoris: Secondary | ICD-10-CM | POA: Diagnosis not present

## 2019-10-22 DIAGNOSIS — E860 Dehydration: Secondary | ICD-10-CM | POA: Diagnosis not present

## 2019-10-22 DIAGNOSIS — R7989 Other specified abnormal findings of blood chemistry: Secondary | ICD-10-CM | POA: Diagnosis not present

## 2019-10-22 DIAGNOSIS — R531 Weakness: Secondary | ICD-10-CM | POA: Diagnosis not present

## 2019-10-22 DIAGNOSIS — E1165 Type 2 diabetes mellitus with hyperglycemia: Secondary | ICD-10-CM | POA: Diagnosis not present

## 2019-10-23 DIAGNOSIS — I4891 Unspecified atrial fibrillation: Secondary | ICD-10-CM | POA: Diagnosis not present

## 2019-10-23 DIAGNOSIS — R279 Unspecified lack of coordination: Secondary | ICD-10-CM | POA: Diagnosis not present

## 2019-10-23 DIAGNOSIS — D6859 Other primary thrombophilia: Secondary | ICD-10-CM | POA: Diagnosis not present

## 2019-10-23 DIAGNOSIS — R531 Weakness: Secondary | ICD-10-CM | POA: Diagnosis not present

## 2019-10-24 DIAGNOSIS — R279 Unspecified lack of coordination: Secondary | ICD-10-CM | POA: Diagnosis not present

## 2019-10-24 DIAGNOSIS — D6859 Other primary thrombophilia: Secondary | ICD-10-CM | POA: Diagnosis not present

## 2019-10-24 DIAGNOSIS — R531 Weakness: Secondary | ICD-10-CM | POA: Diagnosis not present

## 2019-10-24 DIAGNOSIS — I4891 Unspecified atrial fibrillation: Secondary | ICD-10-CM | POA: Diagnosis not present

## 2019-10-25 DIAGNOSIS — I4891 Unspecified atrial fibrillation: Secondary | ICD-10-CM | POA: Diagnosis not present

## 2019-10-25 DIAGNOSIS — D6859 Other primary thrombophilia: Secondary | ICD-10-CM | POA: Diagnosis not present

## 2019-10-25 DIAGNOSIS — I2510 Atherosclerotic heart disease of native coronary artery without angina pectoris: Secondary | ICD-10-CM | POA: Diagnosis not present

## 2019-10-25 DIAGNOSIS — I252 Old myocardial infarction: Secondary | ICD-10-CM | POA: Diagnosis not present

## 2019-10-25 DIAGNOSIS — R531 Weakness: Secondary | ICD-10-CM | POA: Diagnosis not present

## 2019-10-25 DIAGNOSIS — R279 Unspecified lack of coordination: Secondary | ICD-10-CM | POA: Diagnosis not present

## 2019-10-25 DIAGNOSIS — J441 Chronic obstructive pulmonary disease with (acute) exacerbation: Secondary | ICD-10-CM | POA: Diagnosis not present

## 2019-10-25 DIAGNOSIS — J9611 Chronic respiratory failure with hypoxia: Secondary | ICD-10-CM | POA: Diagnosis not present

## 2019-10-26 DIAGNOSIS — I4891 Unspecified atrial fibrillation: Secondary | ICD-10-CM | POA: Diagnosis not present

## 2019-10-26 DIAGNOSIS — R279 Unspecified lack of coordination: Secondary | ICD-10-CM | POA: Diagnosis not present

## 2019-10-26 DIAGNOSIS — D6859 Other primary thrombophilia: Secondary | ICD-10-CM | POA: Diagnosis not present

## 2019-10-26 DIAGNOSIS — R531 Weakness: Secondary | ICD-10-CM | POA: Diagnosis not present

## 2019-10-27 DIAGNOSIS — D6859 Other primary thrombophilia: Secondary | ICD-10-CM | POA: Diagnosis not present

## 2019-10-27 DIAGNOSIS — R279 Unspecified lack of coordination: Secondary | ICD-10-CM | POA: Diagnosis not present

## 2019-10-27 DIAGNOSIS — I4891 Unspecified atrial fibrillation: Secondary | ICD-10-CM | POA: Diagnosis not present

## 2019-10-27 DIAGNOSIS — R531 Weakness: Secondary | ICD-10-CM | POA: Diagnosis not present

## 2019-10-28 DIAGNOSIS — D6859 Other primary thrombophilia: Secondary | ICD-10-CM | POA: Diagnosis not present

## 2019-10-28 DIAGNOSIS — R279 Unspecified lack of coordination: Secondary | ICD-10-CM | POA: Diagnosis not present

## 2019-10-28 DIAGNOSIS — R531 Weakness: Secondary | ICD-10-CM | POA: Diagnosis not present

## 2019-10-28 DIAGNOSIS — I4891 Unspecified atrial fibrillation: Secondary | ICD-10-CM | POA: Diagnosis not present

## 2019-10-29 DIAGNOSIS — I4891 Unspecified atrial fibrillation: Secondary | ICD-10-CM | POA: Diagnosis not present

## 2019-10-29 DIAGNOSIS — D6859 Other primary thrombophilia: Secondary | ICD-10-CM | POA: Diagnosis not present

## 2019-10-29 DIAGNOSIS — R279 Unspecified lack of coordination: Secondary | ICD-10-CM | POA: Diagnosis not present

## 2019-10-29 DIAGNOSIS — R531 Weakness: Secondary | ICD-10-CM | POA: Diagnosis not present

## 2019-10-30 DIAGNOSIS — I4891 Unspecified atrial fibrillation: Secondary | ICD-10-CM | POA: Diagnosis not present

## 2019-10-30 DIAGNOSIS — R279 Unspecified lack of coordination: Secondary | ICD-10-CM | POA: Diagnosis not present

## 2019-10-30 DIAGNOSIS — R531 Weakness: Secondary | ICD-10-CM | POA: Diagnosis not present

## 2019-10-30 DIAGNOSIS — D6859 Other primary thrombophilia: Secondary | ICD-10-CM | POA: Diagnosis not present

## 2019-10-31 DIAGNOSIS — I4891 Unspecified atrial fibrillation: Secondary | ICD-10-CM | POA: Diagnosis not present

## 2019-10-31 DIAGNOSIS — D6859 Other primary thrombophilia: Secondary | ICD-10-CM | POA: Diagnosis not present

## 2019-10-31 DIAGNOSIS — R531 Weakness: Secondary | ICD-10-CM | POA: Diagnosis not present

## 2019-10-31 DIAGNOSIS — R279 Unspecified lack of coordination: Secondary | ICD-10-CM | POA: Diagnosis not present

## 2019-11-01 DIAGNOSIS — M170 Bilateral primary osteoarthritis of knee: Secondary | ICD-10-CM | POA: Diagnosis not present

## 2019-11-01 DIAGNOSIS — I4891 Unspecified atrial fibrillation: Secondary | ICD-10-CM | POA: Diagnosis not present

## 2019-11-01 DIAGNOSIS — R278 Other lack of coordination: Secondary | ICD-10-CM | POA: Diagnosis not present

## 2019-11-01 DIAGNOSIS — R531 Weakness: Secondary | ICD-10-CM | POA: Diagnosis not present

## 2019-11-11 DIAGNOSIS — M6281 Muscle weakness (generalized): Secondary | ICD-10-CM | POA: Diagnosis not present

## 2019-11-11 DIAGNOSIS — Z86711 Personal history of pulmonary embolism: Secondary | ICD-10-CM | POA: Diagnosis not present

## 2019-11-11 DIAGNOSIS — I4821 Permanent atrial fibrillation: Secondary | ICD-10-CM | POA: Diagnosis not present

## 2019-11-11 DIAGNOSIS — R41841 Cognitive communication deficit: Secondary | ICD-10-CM | POA: Diagnosis not present

## 2019-11-11 DIAGNOSIS — Z7901 Long term (current) use of anticoagulants: Secondary | ICD-10-CM | POA: Diagnosis not present

## 2019-11-11 DIAGNOSIS — E119 Type 2 diabetes mellitus without complications: Secondary | ICD-10-CM | POA: Diagnosis not present

## 2019-11-11 DIAGNOSIS — I110 Hypertensive heart disease with heart failure: Secondary | ICD-10-CM | POA: Diagnosis not present

## 2019-11-11 DIAGNOSIS — R2689 Other abnormalities of gait and mobility: Secondary | ICD-10-CM | POA: Diagnosis not present

## 2019-11-11 DIAGNOSIS — M170 Bilateral primary osteoarthritis of knee: Secondary | ICD-10-CM | POA: Diagnosis not present

## 2019-11-11 DIAGNOSIS — R279 Unspecified lack of coordination: Secondary | ICD-10-CM | POA: Diagnosis not present

## 2019-11-11 DIAGNOSIS — I5022 Chronic systolic (congestive) heart failure: Secondary | ICD-10-CM | POA: Diagnosis not present

## 2019-11-18 ENCOUNTER — Other Ambulatory Visit: Payer: Self-pay | Admitting: *Deleted

## 2019-11-18 NOTE — Patient Outreach (Signed)
Late entry for 11/17/19  Screened for potential [facility] Care Management needs as a benefit of [employer].  Member is receiving skilled therapy at [facility].   Writer attended telephonic interdisciplinary team meeting to assess for disposition needs and transition plan for resident.   Facility reports member is from home with her sister. Unfortunately, member's sister is in another facility with COVID. Member has caregivers at home. However, caregiver consistency is questionable per facility.   Noted, member declined [facility] services in Oct. Will plan outreach to member again to discuss [facility] Care Management engagement if the plan is to return home.  Raiford Noble, MSN-Ed, RN,BSN [facility] Post Acute Care Coordinator [phone] [facility]) [phone]  (Toll free office)

## 2019-11-21 MED ORDER — ENOXAPARIN SODIUM 60 MG/0.6ML ~~LOC~~ SOLN: 1.0000 mg/kg | Freq: Two times a day (BID) | SUBCUTANEOUS | 0 refills | Status: DC

## 2019-11-21 NOTE — Telephone Encounter (Signed)
Prep instructions sent to patient. I have faxed this note to PCP advising of below.

## 2019-11-21 NOTE — Telephone Encounter (Signed)
Instructions for holding Coumadin and starting Lovenox:  Last day of Coumadin is 2/25.  2/26: No coumadin or Lovenox 2/27: Lovenox twice a day 2/28: Lovenox twice a day 3/1: Lovenox only in morning.   Sent to pharmacy.  Please have PCP (Dr. Sherril Croon) follow INR after procedure. Needs to know she will be holding it. We have covered Lovenox dosing prior. Will need INR check with them following procedure.

## 2019-11-21 NOTE — Addendum Note (Signed)
Addended by: Gelene Mink on: 11/21/2019 12:35 PM   Modules accepted: Orders

## 2019-11-22 ENCOUNTER — Other Ambulatory Visit: Payer: Self-pay | Admitting: *Deleted

## 2019-11-22 NOTE — Patient Outreach (Signed)
Member screened for potential [facility] Care Management needs as a benefit of [employer].  Rebecca Hanson is currently receiving skilled therapy at [facility].   Telephone call made on member's mobile phone at [phone]. No answer. Left HIPAA compliant voicemail message requesting return call.   Notified facility dc planner of writer's outreach.   Raiford Noble, MSN-Ed, RN,BSN [facility] Post Acute Care Coordinator [phone] [facility]) [phone]  (Toll free office)

## 2019-11-24 ENCOUNTER — Other Ambulatory Visit: Payer: Self-pay | Admitting: *Deleted

## 2019-11-24 NOTE — Patient Outreach (Signed)
error 

## 2019-11-24 NOTE — Patient Outreach (Signed)
Screened for potential [facility] Care Management needs as a benefit of  [employer].  Ms. Bryand is currently receiving skilled therapy at [facility].  Writer attended telephonic interdisciplinary team meeting to assess for disposition needs and transition plan for resident.   Therapy Director reports that he has spoken to member's sister and the plan is to remain under private pay for long term care at the facility. May potentially transition to ALF at some point.  No identifiable [facility] Care Management needs at this time.  Raiford Noble, MSN-Ed, RN,BSN [facility] Post Acute Care Coordinator [phone] [facility]) [phone]  (Toll free office)

## 2019-11-28 DIAGNOSIS — I4821 Permanent atrial fibrillation: Secondary | ICD-10-CM | POA: Diagnosis not present

## 2019-11-28 DIAGNOSIS — R531 Weakness: Secondary | ICD-10-CM | POA: Diagnosis not present

## 2019-11-28 DIAGNOSIS — R278 Other lack of coordination: Secondary | ICD-10-CM | POA: Diagnosis not present

## 2019-11-28 DIAGNOSIS — R2689 Other abnormalities of gait and mobility: Secondary | ICD-10-CM | POA: Diagnosis not present

## 2019-11-28 DIAGNOSIS — E119 Type 2 diabetes mellitus without complications: Secondary | ICD-10-CM | POA: Diagnosis not present

## 2019-11-28 DIAGNOSIS — M6281 Muscle weakness (generalized): Secondary | ICD-10-CM | POA: Diagnosis not present

## 2019-11-28 DIAGNOSIS — I4891 Unspecified atrial fibrillation: Secondary | ICD-10-CM | POA: Diagnosis not present

## 2019-11-28 DIAGNOSIS — R4182 Altered mental status, unspecified: Secondary | ICD-10-CM | POA: Diagnosis not present

## 2019-11-29 DIAGNOSIS — R2689 Other abnormalities of gait and mobility: Secondary | ICD-10-CM | POA: Diagnosis not present

## 2019-11-29 DIAGNOSIS — I4821 Permanent atrial fibrillation: Secondary | ICD-10-CM | POA: Diagnosis not present

## 2019-11-29 DIAGNOSIS — M6281 Muscle weakness (generalized): Secondary | ICD-10-CM | POA: Diagnosis not present

## 2019-11-30 DIAGNOSIS — I4821 Permanent atrial fibrillation: Secondary | ICD-10-CM | POA: Diagnosis not present

## 2019-11-30 DIAGNOSIS — R2689 Other abnormalities of gait and mobility: Secondary | ICD-10-CM | POA: Diagnosis not present

## 2019-11-30 DIAGNOSIS — M6281 Muscle weakness (generalized): Secondary | ICD-10-CM | POA: Diagnosis not present

## 2019-12-01 DIAGNOSIS — R2689 Other abnormalities of gait and mobility: Secondary | ICD-10-CM | POA: Diagnosis not present

## 2019-12-01 DIAGNOSIS — I4821 Permanent atrial fibrillation: Secondary | ICD-10-CM | POA: Diagnosis not present

## 2019-12-01 DIAGNOSIS — M6281 Muscle weakness (generalized): Secondary | ICD-10-CM | POA: Diagnosis not present

## 2019-12-02 DIAGNOSIS — R2689 Other abnormalities of gait and mobility: Secondary | ICD-10-CM | POA: Diagnosis not present

## 2019-12-02 DIAGNOSIS — I4821 Permanent atrial fibrillation: Secondary | ICD-10-CM | POA: Diagnosis not present

## 2019-12-02 DIAGNOSIS — M6281 Muscle weakness (generalized): Secondary | ICD-10-CM | POA: Diagnosis not present

## 2019-12-03 DIAGNOSIS — I4821 Permanent atrial fibrillation: Secondary | ICD-10-CM | POA: Diagnosis not present

## 2019-12-03 DIAGNOSIS — R2689 Other abnormalities of gait and mobility: Secondary | ICD-10-CM | POA: Diagnosis not present

## 2019-12-03 DIAGNOSIS — M6281 Muscle weakness (generalized): Secondary | ICD-10-CM | POA: Diagnosis not present

## 2019-12-05 DIAGNOSIS — R2689 Other abnormalities of gait and mobility: Secondary | ICD-10-CM | POA: Diagnosis not present

## 2019-12-05 DIAGNOSIS — I4821 Permanent atrial fibrillation: Secondary | ICD-10-CM | POA: Diagnosis not present

## 2019-12-05 DIAGNOSIS — M6281 Muscle weakness (generalized): Secondary | ICD-10-CM | POA: Diagnosis not present

## 2019-12-06 DIAGNOSIS — R2689 Other abnormalities of gait and mobility: Secondary | ICD-10-CM | POA: Diagnosis not present

## 2019-12-06 DIAGNOSIS — M6281 Muscle weakness (generalized): Secondary | ICD-10-CM | POA: Diagnosis not present

## 2019-12-06 DIAGNOSIS — I4821 Permanent atrial fibrillation: Secondary | ICD-10-CM | POA: Diagnosis not present

## 2019-12-07 ENCOUNTER — Telehealth: Payer: Medicare Other | Admitting: Cardiovascular Disease

## 2019-12-07 DIAGNOSIS — R2689 Other abnormalities of gait and mobility: Secondary | ICD-10-CM | POA: Diagnosis not present

## 2019-12-07 DIAGNOSIS — M6281 Muscle weakness (generalized): Secondary | ICD-10-CM | POA: Diagnosis not present

## 2019-12-07 DIAGNOSIS — I4821 Permanent atrial fibrillation: Secondary | ICD-10-CM | POA: Diagnosis not present

## 2019-12-08 DIAGNOSIS — I4821 Permanent atrial fibrillation: Secondary | ICD-10-CM | POA: Diagnosis not present

## 2019-12-08 DIAGNOSIS — M6281 Muscle weakness (generalized): Secondary | ICD-10-CM | POA: Diagnosis not present

## 2019-12-08 DIAGNOSIS — R2689 Other abnormalities of gait and mobility: Secondary | ICD-10-CM | POA: Diagnosis not present

## 2019-12-09 DIAGNOSIS — I4821 Permanent atrial fibrillation: Secondary | ICD-10-CM | POA: Diagnosis not present

## 2019-12-09 DIAGNOSIS — R2689 Other abnormalities of gait and mobility: Secondary | ICD-10-CM | POA: Diagnosis not present

## 2019-12-09 DIAGNOSIS — M6281 Muscle weakness (generalized): Secondary | ICD-10-CM | POA: Diagnosis not present

## 2019-12-12 DIAGNOSIS — R2689 Other abnormalities of gait and mobility: Secondary | ICD-10-CM | POA: Diagnosis not present

## 2019-12-12 DIAGNOSIS — I4821 Permanent atrial fibrillation: Secondary | ICD-10-CM | POA: Diagnosis not present

## 2019-12-12 DIAGNOSIS — M6281 Muscle weakness (generalized): Secondary | ICD-10-CM | POA: Diagnosis not present

## 2019-12-13 DIAGNOSIS — R2689 Other abnormalities of gait and mobility: Secondary | ICD-10-CM | POA: Diagnosis not present

## 2019-12-13 DIAGNOSIS — I4821 Permanent atrial fibrillation: Secondary | ICD-10-CM | POA: Diagnosis not present

## 2019-12-13 DIAGNOSIS — M6281 Muscle weakness (generalized): Secondary | ICD-10-CM | POA: Diagnosis not present

## 2019-12-14 DIAGNOSIS — R2689 Other abnormalities of gait and mobility: Secondary | ICD-10-CM | POA: Diagnosis not present

## 2019-12-14 DIAGNOSIS — I4821 Permanent atrial fibrillation: Secondary | ICD-10-CM | POA: Diagnosis not present

## 2019-12-14 DIAGNOSIS — Z5181 Encounter for therapeutic drug level monitoring: Secondary | ICD-10-CM | POA: Diagnosis not present

## 2019-12-14 DIAGNOSIS — M6281 Muscle weakness (generalized): Secondary | ICD-10-CM | POA: Diagnosis not present

## 2019-12-14 DIAGNOSIS — Z7901 Long term (current) use of anticoagulants: Secondary | ICD-10-CM | POA: Diagnosis not present

## 2019-12-14 DIAGNOSIS — Z20822 Contact with and (suspected) exposure to covid-19: Secondary | ICD-10-CM | POA: Diagnosis not present

## 2019-12-15 DIAGNOSIS — Z5181 Encounter for therapeutic drug level monitoring: Secondary | ICD-10-CM | POA: Diagnosis not present

## 2019-12-15 DIAGNOSIS — Z23 Encounter for immunization: Secondary | ICD-10-CM | POA: Diagnosis not present

## 2019-12-15 DIAGNOSIS — Z7901 Long term (current) use of anticoagulants: Secondary | ICD-10-CM | POA: Diagnosis not present

## 2019-12-15 DIAGNOSIS — M6281 Muscle weakness (generalized): Secondary | ICD-10-CM | POA: Diagnosis not present

## 2019-12-15 DIAGNOSIS — I4821 Permanent atrial fibrillation: Secondary | ICD-10-CM | POA: Diagnosis not present

## 2019-12-15 DIAGNOSIS — Z20822 Contact with and (suspected) exposure to covid-19: Secondary | ICD-10-CM | POA: Diagnosis not present

## 2019-12-15 DIAGNOSIS — R2689 Other abnormalities of gait and mobility: Secondary | ICD-10-CM | POA: Diagnosis not present

## 2019-12-16 DIAGNOSIS — I4821 Permanent atrial fibrillation: Secondary | ICD-10-CM | POA: Diagnosis not present

## 2019-12-16 DIAGNOSIS — M6281 Muscle weakness (generalized): Secondary | ICD-10-CM | POA: Diagnosis not present

## 2019-12-16 DIAGNOSIS — R2689 Other abnormalities of gait and mobility: Secondary | ICD-10-CM | POA: Diagnosis not present

## 2019-12-19 DIAGNOSIS — Z20822 Contact with and (suspected) exposure to covid-19: Secondary | ICD-10-CM | POA: Diagnosis not present

## 2019-12-19 DIAGNOSIS — I4821 Permanent atrial fibrillation: Secondary | ICD-10-CM | POA: Diagnosis not present

## 2019-12-19 DIAGNOSIS — Z5181 Encounter for therapeutic drug level monitoring: Secondary | ICD-10-CM | POA: Diagnosis not present

## 2019-12-19 DIAGNOSIS — M6281 Muscle weakness (generalized): Secondary | ICD-10-CM | POA: Diagnosis not present

## 2019-12-19 DIAGNOSIS — Z7901 Long term (current) use of anticoagulants: Secondary | ICD-10-CM | POA: Diagnosis not present

## 2019-12-19 DIAGNOSIS — R2689 Other abnormalities of gait and mobility: Secondary | ICD-10-CM | POA: Diagnosis not present

## 2019-12-20 DIAGNOSIS — I4821 Permanent atrial fibrillation: Secondary | ICD-10-CM | POA: Diagnosis not present

## 2019-12-20 DIAGNOSIS — Z7901 Long term (current) use of anticoagulants: Secondary | ICD-10-CM | POA: Diagnosis not present

## 2019-12-20 DIAGNOSIS — R2689 Other abnormalities of gait and mobility: Secondary | ICD-10-CM | POA: Diagnosis not present

## 2019-12-20 DIAGNOSIS — M6281 Muscle weakness (generalized): Secondary | ICD-10-CM | POA: Diagnosis not present

## 2019-12-20 DIAGNOSIS — Z20822 Contact with and (suspected) exposure to covid-19: Secondary | ICD-10-CM | POA: Diagnosis not present

## 2019-12-20 DIAGNOSIS — Z5181 Encounter for therapeutic drug level monitoring: Secondary | ICD-10-CM | POA: Diagnosis not present

## 2019-12-21 DIAGNOSIS — I4821 Permanent atrial fibrillation: Secondary | ICD-10-CM | POA: Diagnosis not present

## 2019-12-21 DIAGNOSIS — M6281 Muscle weakness (generalized): Secondary | ICD-10-CM | POA: Diagnosis not present

## 2019-12-21 DIAGNOSIS — R2689 Other abnormalities of gait and mobility: Secondary | ICD-10-CM | POA: Diagnosis not present

## 2019-12-22 DIAGNOSIS — Z7901 Long term (current) use of anticoagulants: Secondary | ICD-10-CM | POA: Diagnosis not present

## 2019-12-22 DIAGNOSIS — Z5181 Encounter for therapeutic drug level monitoring: Secondary | ICD-10-CM | POA: Diagnosis not present

## 2019-12-22 DIAGNOSIS — R2689 Other abnormalities of gait and mobility: Secondary | ICD-10-CM | POA: Diagnosis not present

## 2019-12-22 DIAGNOSIS — I4821 Permanent atrial fibrillation: Secondary | ICD-10-CM | POA: Diagnosis not present

## 2019-12-22 DIAGNOSIS — M6281 Muscle weakness (generalized): Secondary | ICD-10-CM | POA: Diagnosis not present

## 2019-12-22 DIAGNOSIS — Z20822 Contact with and (suspected) exposure to covid-19: Secondary | ICD-10-CM | POA: Diagnosis not present

## 2019-12-23 DIAGNOSIS — Z5181 Encounter for therapeutic drug level monitoring: Secondary | ICD-10-CM | POA: Diagnosis not present

## 2019-12-23 DIAGNOSIS — M6281 Muscle weakness (generalized): Secondary | ICD-10-CM | POA: Diagnosis not present

## 2019-12-23 DIAGNOSIS — R2689 Other abnormalities of gait and mobility: Secondary | ICD-10-CM | POA: Diagnosis not present

## 2019-12-23 DIAGNOSIS — I4821 Permanent atrial fibrillation: Secondary | ICD-10-CM | POA: Diagnosis not present

## 2019-12-23 DIAGNOSIS — Z20822 Contact with and (suspected) exposure to covid-19: Secondary | ICD-10-CM | POA: Diagnosis not present

## 2019-12-23 DIAGNOSIS — Z7901 Long term (current) use of anticoagulants: Secondary | ICD-10-CM | POA: Diagnosis not present

## 2019-12-26 DIAGNOSIS — M6281 Muscle weakness (generalized): Secondary | ICD-10-CM | POA: Diagnosis not present

## 2019-12-26 DIAGNOSIS — I4821 Permanent atrial fibrillation: Secondary | ICD-10-CM | POA: Diagnosis not present

## 2019-12-26 DIAGNOSIS — R2689 Other abnormalities of gait and mobility: Secondary | ICD-10-CM | POA: Diagnosis not present

## 2019-12-27 DIAGNOSIS — Z7901 Long term (current) use of anticoagulants: Secondary | ICD-10-CM | POA: Diagnosis not present

## 2019-12-27 DIAGNOSIS — Z5181 Encounter for therapeutic drug level monitoring: Secondary | ICD-10-CM | POA: Diagnosis not present

## 2019-12-27 DIAGNOSIS — Z20822 Contact with and (suspected) exposure to covid-19: Secondary | ICD-10-CM | POA: Diagnosis not present

## 2019-12-27 DIAGNOSIS — I4821 Permanent atrial fibrillation: Secondary | ICD-10-CM | POA: Diagnosis not present

## 2019-12-27 DIAGNOSIS — M6281 Muscle weakness (generalized): Secondary | ICD-10-CM | POA: Diagnosis not present

## 2019-12-27 DIAGNOSIS — R2689 Other abnormalities of gait and mobility: Secondary | ICD-10-CM | POA: Diagnosis not present

## 2019-12-28 DIAGNOSIS — R2689 Other abnormalities of gait and mobility: Secondary | ICD-10-CM | POA: Diagnosis not present

## 2019-12-28 DIAGNOSIS — I4821 Permanent atrial fibrillation: Secondary | ICD-10-CM | POA: Diagnosis not present

## 2019-12-28 DIAGNOSIS — M6281 Muscle weakness (generalized): Secondary | ICD-10-CM | POA: Diagnosis not present

## 2019-12-29 DIAGNOSIS — M6281 Muscle weakness (generalized): Secondary | ICD-10-CM | POA: Diagnosis not present

## 2019-12-29 DIAGNOSIS — I4821 Permanent atrial fibrillation: Secondary | ICD-10-CM | POA: Diagnosis not present

## 2019-12-29 DIAGNOSIS — R2689 Other abnormalities of gait and mobility: Secondary | ICD-10-CM | POA: Diagnosis not present

## 2019-12-30 DIAGNOSIS — Z20822 Contact with and (suspected) exposure to covid-19: Secondary | ICD-10-CM | POA: Diagnosis not present

## 2019-12-30 DIAGNOSIS — M6281 Muscle weakness (generalized): Secondary | ICD-10-CM | POA: Diagnosis not present

## 2019-12-30 DIAGNOSIS — Z5181 Encounter for therapeutic drug level monitoring: Secondary | ICD-10-CM | POA: Diagnosis not present

## 2019-12-30 DIAGNOSIS — R2689 Other abnormalities of gait and mobility: Secondary | ICD-10-CM | POA: Diagnosis not present

## 2019-12-30 DIAGNOSIS — I4821 Permanent atrial fibrillation: Secondary | ICD-10-CM | POA: Diagnosis not present

## 2019-12-30 DIAGNOSIS — Z7901 Long term (current) use of anticoagulants: Secondary | ICD-10-CM | POA: Diagnosis not present

## 2020-01-02 DIAGNOSIS — R2689 Other abnormalities of gait and mobility: Secondary | ICD-10-CM | POA: Diagnosis not present

## 2020-01-02 DIAGNOSIS — M6281 Muscle weakness (generalized): Secondary | ICD-10-CM | POA: Diagnosis not present

## 2020-01-02 DIAGNOSIS — I4821 Permanent atrial fibrillation: Secondary | ICD-10-CM | POA: Diagnosis not present

## 2020-01-03 DIAGNOSIS — Z7901 Long term (current) use of anticoagulants: Secondary | ICD-10-CM | POA: Diagnosis not present

## 2020-01-03 DIAGNOSIS — Z20822 Contact with and (suspected) exposure to covid-19: Secondary | ICD-10-CM | POA: Diagnosis not present

## 2020-01-03 DIAGNOSIS — Z5181 Encounter for therapeutic drug level monitoring: Secondary | ICD-10-CM | POA: Diagnosis not present

## 2020-01-03 DIAGNOSIS — I4821 Permanent atrial fibrillation: Secondary | ICD-10-CM | POA: Diagnosis not present

## 2020-01-03 DIAGNOSIS — R2689 Other abnormalities of gait and mobility: Secondary | ICD-10-CM | POA: Diagnosis not present

## 2020-01-03 DIAGNOSIS — M6281 Muscle weakness (generalized): Secondary | ICD-10-CM | POA: Diagnosis not present

## 2020-01-04 ENCOUNTER — Encounter (HOSPITAL_COMMUNITY): Payer: Self-pay

## 2020-01-04 DIAGNOSIS — R2689 Other abnormalities of gait and mobility: Secondary | ICD-10-CM | POA: Diagnosis not present

## 2020-01-04 DIAGNOSIS — I4821 Permanent atrial fibrillation: Secondary | ICD-10-CM | POA: Diagnosis not present

## 2020-01-04 DIAGNOSIS — M6281 Muscle weakness (generalized): Secondary | ICD-10-CM | POA: Diagnosis not present

## 2020-01-04 NOTE — Patient Instructions (Signed)
Your procedure is scheduled on: 01/10/2020  Report to [facility] at   9:15  AM.  Call this number if you have problems the morning of surgery: [phone]   Remember:              Follow Directions on the letter you received from Your Physician's office regarding the Bowel Prep              No Smoking the day of Procedure :   Take these medicines the morning of surgery with A SIP OF WATER: Carvedilol and Entresto              Follow instructions from office:  When to hold Coumadin and bridge with lovenox   Do not wear jewelry, make-up or nail polish.    Do not bring valuables to the hospital.  Contacts, dentures or bridgework may not be worn into surgery.  .   Patients discharged the day of surgery will not be allowed to drive home.     Colonoscopy, Adult, Care After This sheet gives you information about how to care for yourself after your procedure. Your health care provider may also give you more specific instructions. If you have problems or questions, contact your health care provider. What can I expect after the procedure? After the procedure, it is common to have:  A small amount of blood in your stool for 24 hours after the procedure.  Some gas.  Mild abdominal cramping or bloating.  Follow these instructions at home: General instructions   For the first 24 hours after the procedure: ? Do not drive or use machinery. ? Do not sign important documents. ? Do not drink alcohol. ? Do your regular daily activities at a slower pace than normal. ? Eat soft, easy-to-digest foods. ? Rest often.  Take over-the-counter or prescription medicines only as told by your health care provider.  It is up to you to get the results of your procedure. Ask your health care provider, or the department performing the procedure, when your results will be ready. Relieving cramping and bloating  Try walking around when you have cramps or feel bloated.  Apply heat to your abdomen as  told by your health care provider. Use a heat source that your health care provider recommends, such as a moist heat pack or a heating pad. ? Place a towel between your skin and the heat source. ? Leave the heat on for 20-30 minutes. ? Remove the heat if your skin turns bright red. This is especially important if you are unable to feel pain, heat, or cold. You may have a greater risk of getting burned. Eating and drinking  Drink enough fluid to keep your urine clear or pale yellow.  Resume your normal diet as instructed by your health care provider. Avoid heavy or fried foods that are hard to digest.  Avoid drinking alcohol for as long as instructed by your health care provider. Contact a health care provider if:  You have blood in your stool 2-3 days after the procedure. Get help right away if:  You have more than a small spotting of blood in your stool.  You pass large blood clots in your stool.  Your abdomen is swollen.  You have nausea or vomiting.  You have a fever.  You have increasing abdominal pain that is not relieved with medicine. This information is not intended to replace advice given to you by your health care provider. Make sure you  discuss any questions you have with your health care provider. Document Released: 06/10/2004 Document Revised: 07/21/2016 Document Reviewed: 01/08/2016 Elsevier Interactive Patient Education  2018 Elsevier Inc.  Upper Endoscopy, Adult, Care After This sheet gives you information about how to care for yourself after your procedure. Your health care provider may also give you more specific instructions. If you have problems or questions, contact your health care provider. What can I expect after the procedure? After the procedure, it is common to have:  A sore throat.  Mild stomach pain or discomfort.  Bloating.  Nausea. Follow these instructions at home:   Follow instructions from your health care provider about what to eat or  drink after your procedure.  Return to your normal activities as told by your health care provider. Ask your health care provider what activities are safe for you.  Take over-the-counter and prescription medicines only as told by your health care provider.  Do not drive for 24 hours if you were given a sedative during your procedure.  Keep all follow-up visits as told by your health care provider. This is important. Contact a health care provider if you have:  A sore throat that lasts longer than one day.  Trouble swallowing. Get help right away if:  You vomit blood or your vomit looks like coffee grounds.  You have: ? A fever. ? Bloody, black, or tarry stools. ? A severe sore throat or you cannot swallow. ? Difficulty breathing. ? Severe pain in your chest or abdomen. Summary  After the procedure, it is common to have a sore throat, mild stomach discomfort, bloating, and nausea.  Do not drive for 24 hours if you were given a sedative during the procedure.  Follow instructions from your health care provider about what to eat or drink after your procedure.  Return to your normal activities as told by your health care provider. This information is not intended to replace advice given to you by your health care provider. Make sure you discuss any questions you have with your health care provider. Document Revised: 04/20/2018 Document Reviewed: 03/29/2018 Elsevier Patient Education  2020 [employer].

## 2020-01-05 DIAGNOSIS — R2689 Other abnormalities of gait and mobility: Secondary | ICD-10-CM | POA: Diagnosis not present

## 2020-01-05 DIAGNOSIS — I4821 Permanent atrial fibrillation: Secondary | ICD-10-CM | POA: Diagnosis not present

## 2020-01-05 DIAGNOSIS — M6281 Muscle weakness (generalized): Secondary | ICD-10-CM | POA: Diagnosis not present

## 2020-01-06 ENCOUNTER — Other Ambulatory Visit (HOSPITAL_COMMUNITY)
Admission: RE | Admit: 2020-01-06 | Discharge: 2020-01-06 | Disposition: A | Discharge status: Home / Self Care | Payer: Medicare Other | Source: Ambulatory Visit | Attending: Gastroenterology | Admitting: Gastroenterology

## 2020-01-06 ENCOUNTER — Encounter (HOSPITAL_COMMUNITY): Payer: Self-pay

## 2020-01-06 ENCOUNTER — Encounter (HOSPITAL_COMMUNITY)
Admission: RE | Admit: 2020-01-06 | Discharge: 2020-01-06 | Disposition: A | Discharge status: Home / Self Care | Payer: Medicare Other | Source: Ambulatory Visit | Attending: Gastroenterology | Admitting: Gastroenterology

## 2020-01-06 ENCOUNTER — Other Ambulatory Visit: Payer: Self-pay

## 2020-01-06 DIAGNOSIS — R2689 Other abnormalities of gait and mobility: Secondary | ICD-10-CM | POA: Diagnosis not present

## 2020-01-06 DIAGNOSIS — I4821 Permanent atrial fibrillation: Secondary | ICD-10-CM | POA: Diagnosis not present

## 2020-01-06 DIAGNOSIS — Z7901 Long term (current) use of anticoagulants: Secondary | ICD-10-CM | POA: Diagnosis not present

## 2020-01-06 DIAGNOSIS — Z5181 Encounter for therapeutic drug level monitoring: Secondary | ICD-10-CM | POA: Diagnosis not present

## 2020-01-06 DIAGNOSIS — Z20822 Contact with and (suspected) exposure to covid-19: Secondary | ICD-10-CM | POA: Diagnosis not present

## 2020-01-06 DIAGNOSIS — M6281 Muscle weakness (generalized): Secondary | ICD-10-CM | POA: Diagnosis not present

## 2020-01-06 NOTE — Pre-Procedure Instructions (Signed)
Patient was a no show for her PAT. I attempted to call her at home but got no answer and a full mailbox. I read through multiple notes in her chart and found out she is a resident at [facility]. I called there and got transferred multiple times and am not sure who I ended up speaking with. 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 She states that there are no orders for patient to hold coumadin(as office note states to do), no orders for a bowel prep and nothing written that patient is to have a procedure on Tuesday. I gave her the number and told her someone there would need to call [facility] on Monday and get this procedure rescheduled and gave her the number. I asked who [facility] would need to contact to make sure this does not occur again and that Mrs Rebecca Hanson gets her procedure rescheduled. She states they would need to contact Rebecca Hanson [phone] ext [phone]. I interoffice mailed Rebecca Loron, NP a note about this occurrence as well as the contact information for [place].

## 2020-01-08 DIAGNOSIS — R52 Pain, unspecified: Secondary | ICD-10-CM | POA: Diagnosis not present

## 2020-01-09 ENCOUNTER — Encounter (HOSPITAL_COMMUNITY): Payer: Self-pay | Admitting: Anesthesiology

## 2020-01-09 ENCOUNTER — Telehealth: Payer: Self-pay | Admitting: Gastroenterology

## 2020-01-09 DIAGNOSIS — M79605 Pain in left leg: Secondary | ICD-10-CM | POA: Diagnosis not present

## 2020-01-09 DIAGNOSIS — R531 Weakness: Secondary | ICD-10-CM | POA: Diagnosis not present

## 2020-01-09 DIAGNOSIS — I4821 Permanent atrial fibrillation: Secondary | ICD-10-CM | POA: Diagnosis not present

## 2020-01-09 DIAGNOSIS — I82402 Acute embolism and thrombosis of unspecified deep veins of left lower extremity: Secondary | ICD-10-CM | POA: Diagnosis not present

## 2020-01-09 DIAGNOSIS — R2689 Other abnormalities of gait and mobility: Secondary | ICD-10-CM | POA: Diagnosis not present

## 2020-01-09 DIAGNOSIS — M6281 Muscle weakness (generalized): Secondary | ICD-10-CM | POA: Diagnosis not present

## 2020-01-09 NOTE — Telephone Encounter (Signed)
I received a note from Zada Finders, RN, that patient did not show for pre-admission testing. We need to cancel procedure for tomorrow.  She is at [facility]; they don't have any orders, prep, instructions for Coumadin, etc. I suspect this is because she was not there when we sent everything back in Nov 2020.   The contact person is [employer]: [phone] ext [phone]  If she has had a change in health status, would be best to see and update prior to procedures.   Please cancel TCS/EGD for tomorrow, as pre-admission test (COVID testing) not completed and patient needs Lovenox bridging.

## 2020-01-09 NOTE — Telephone Encounter (Signed)
Nursing Facility [facility] over the weekend that they do not have any prep instructions on patient and patient is scheduled with [facility] tomorrow. [phone]

## 2020-01-09 NOTE — Telephone Encounter (Signed)
Darl Pikes, please contact nursing home to schedule OV per [facility]. The contact person is Scherry Ran Gillmore: [phone] ext [phone]   LMOVM for endo scheduler to cancel procedure tomorrow.

## 2020-01-10 ENCOUNTER — Ambulatory Visit (HOSPITAL_COMMUNITY): Admission: RE | Admit: 2020-01-10 | Payer: Medicare Other | Source: Home / Self Care | Admitting: Gastroenterology

## 2020-01-10 ENCOUNTER — Encounter (HOSPITAL_COMMUNITY): Admission: RE | Payer: Self-pay | Source: Home / Self Care

## 2020-01-10 DIAGNOSIS — Z20822 Contact with and (suspected) exposure to covid-19: Secondary | ICD-10-CM | POA: Diagnosis not present

## 2020-01-10 DIAGNOSIS — Z7901 Long term (current) use of anticoagulants: Secondary | ICD-10-CM | POA: Diagnosis not present

## 2020-01-10 DIAGNOSIS — K5900 Constipation, unspecified: Secondary | ICD-10-CM | POA: Diagnosis not present

## 2020-01-10 DIAGNOSIS — Z5181 Encounter for therapeutic drug level monitoring: Secondary | ICD-10-CM | POA: Diagnosis not present

## 2020-01-10 DIAGNOSIS — I4821 Permanent atrial fibrillation: Secondary | ICD-10-CM | POA: Diagnosis not present

## 2020-01-10 DIAGNOSIS — M6281 Muscle weakness (generalized): Secondary | ICD-10-CM | POA: Diagnosis not present

## 2020-01-10 DIAGNOSIS — I82401 Acute embolism and thrombosis of unspecified deep veins of right lower extremity: Secondary | ICD-10-CM | POA: Diagnosis not present

## 2020-01-10 DIAGNOSIS — R2689 Other abnormalities of gait and mobility: Secondary | ICD-10-CM | POA: Diagnosis not present

## 2020-01-10 DIAGNOSIS — E119 Type 2 diabetes mellitus without complications: Secondary | ICD-10-CM | POA: Diagnosis not present

## 2020-01-10 SURGERY — COLONOSCOPY WITH PROPOFOL
Anesthesia: Monitor Anesthesia Care

## 2020-01-11 DIAGNOSIS — R2689 Other abnormalities of gait and mobility: Secondary | ICD-10-CM | POA: Diagnosis not present

## 2020-01-11 DIAGNOSIS — I4821 Permanent atrial fibrillation: Secondary | ICD-10-CM | POA: Diagnosis not present

## 2020-01-11 DIAGNOSIS — M6281 Muscle weakness (generalized): Secondary | ICD-10-CM | POA: Diagnosis not present

## 2020-01-12 DIAGNOSIS — M7989 Other specified soft tissue disorders: Secondary | ICD-10-CM | POA: Diagnosis not present

## 2020-01-12 DIAGNOSIS — I4821 Permanent atrial fibrillation: Secondary | ICD-10-CM | POA: Diagnosis not present

## 2020-01-12 DIAGNOSIS — M79605 Pain in left leg: Secondary | ICD-10-CM | POA: Diagnosis not present

## 2020-01-12 DIAGNOSIS — Z23 Encounter for immunization: Secondary | ICD-10-CM | POA: Diagnosis not present

## 2020-01-12 DIAGNOSIS — M6281 Muscle weakness (generalized): Secondary | ICD-10-CM | POA: Diagnosis not present

## 2020-01-12 DIAGNOSIS — R2689 Other abnormalities of gait and mobility: Secondary | ICD-10-CM | POA: Diagnosis not present

## 2020-01-12 DIAGNOSIS — M7122 Synovial cyst of popliteal space [Baker], left knee: Secondary | ICD-10-CM | POA: Diagnosis not present

## 2020-01-13 DIAGNOSIS — R2689 Other abnormalities of gait and mobility: Secondary | ICD-10-CM | POA: Diagnosis not present

## 2020-01-13 DIAGNOSIS — I4821 Permanent atrial fibrillation: Secondary | ICD-10-CM | POA: Diagnosis not present

## 2020-01-13 DIAGNOSIS — M6281 Muscle weakness (generalized): Secondary | ICD-10-CM | POA: Diagnosis not present

## 2020-01-16 DIAGNOSIS — I4821 Permanent atrial fibrillation: Secondary | ICD-10-CM | POA: Diagnosis not present

## 2020-01-16 DIAGNOSIS — R2689 Other abnormalities of gait and mobility: Secondary | ICD-10-CM | POA: Diagnosis not present

## 2020-01-16 DIAGNOSIS — M6281 Muscle weakness (generalized): Secondary | ICD-10-CM | POA: Diagnosis not present

## 2020-01-17 DIAGNOSIS — Z7901 Long term (current) use of anticoagulants: Secondary | ICD-10-CM | POA: Diagnosis not present

## 2020-01-17 DIAGNOSIS — M6281 Muscle weakness (generalized): Secondary | ICD-10-CM | POA: Diagnosis not present

## 2020-01-17 DIAGNOSIS — R2689 Other abnormalities of gait and mobility: Secondary | ICD-10-CM | POA: Diagnosis not present

## 2020-01-17 DIAGNOSIS — Z20822 Contact with and (suspected) exposure to covid-19: Secondary | ICD-10-CM | POA: Diagnosis not present

## 2020-01-17 DIAGNOSIS — I4821 Permanent atrial fibrillation: Secondary | ICD-10-CM | POA: Diagnosis not present

## 2020-01-17 DIAGNOSIS — Z5181 Encounter for therapeutic drug level monitoring: Secondary | ICD-10-CM | POA: Diagnosis not present

## 2020-01-18 DIAGNOSIS — R2689 Other abnormalities of gait and mobility: Secondary | ICD-10-CM | POA: Diagnosis not present

## 2020-01-18 DIAGNOSIS — I4821 Permanent atrial fibrillation: Secondary | ICD-10-CM | POA: Diagnosis not present

## 2020-01-18 DIAGNOSIS — M6281 Muscle weakness (generalized): Secondary | ICD-10-CM | POA: Diagnosis not present

## 2020-01-19 DIAGNOSIS — R600 Localized edema: Secondary | ICD-10-CM | POA: Diagnosis not present

## 2020-01-19 NOTE — Telephone Encounter (Signed)
CALLED NURSING HOME AND GAVE APPOINTMENT DATE AND TIME TO SUPERVISOR.  PATIENT BEING DISCHARGED TODAY BUT THEY WILL GIVE HER THE APPOINTMENT

## 2020-01-20 DIAGNOSIS — Z7901 Long term (current) use of anticoagulants: Secondary | ICD-10-CM | POA: Diagnosis not present

## 2020-01-20 DIAGNOSIS — M353 Polymyalgia rheumatica: Secondary | ICD-10-CM | POA: Diagnosis not present

## 2020-01-20 DIAGNOSIS — Z7984 Long term (current) use of oral hypoglycemic drugs: Secondary | ICD-10-CM | POA: Diagnosis not present

## 2020-01-20 DIAGNOSIS — I110 Hypertensive heart disease with heart failure: Secondary | ICD-10-CM | POA: Diagnosis not present

## 2020-01-20 DIAGNOSIS — M6281 Muscle weakness (generalized): Secondary | ICD-10-CM | POA: Diagnosis not present

## 2020-01-20 DIAGNOSIS — I5022 Chronic systolic (congestive) heart failure: Secondary | ICD-10-CM | POA: Diagnosis not present

## 2020-01-20 DIAGNOSIS — E119 Type 2 diabetes mellitus without complications: Secondary | ICD-10-CM | POA: Diagnosis not present

## 2020-01-20 DIAGNOSIS — I4821 Permanent atrial fibrillation: Secondary | ICD-10-CM | POA: Diagnosis not present

## 2020-01-20 DIAGNOSIS — R41841 Cognitive communication deficit: Secondary | ICD-10-CM | POA: Diagnosis not present

## 2020-01-20 DIAGNOSIS — M170 Bilateral primary osteoarthritis of knee: Secondary | ICD-10-CM | POA: Diagnosis not present

## 2020-01-20 DIAGNOSIS — I2510 Atherosclerotic heart disease of native coronary artery without angina pectoris: Secondary | ICD-10-CM | POA: Diagnosis not present

## 2020-01-20 DIAGNOSIS — I82402 Acute embolism and thrombosis of unspecified deep veins of left lower extremity: Secondary | ICD-10-CM | POA: Diagnosis not present

## 2020-01-20 DIAGNOSIS — Z86711 Personal history of pulmonary embolism: Secondary | ICD-10-CM | POA: Diagnosis not present

## 2020-01-20 DIAGNOSIS — E785 Hyperlipidemia, unspecified: Secondary | ICD-10-CM | POA: Diagnosis not present

## 2020-01-20 DIAGNOSIS — I340 Nonrheumatic mitral (valve) insufficiency: Secondary | ICD-10-CM | POA: Diagnosis not present

## 2020-01-25 DIAGNOSIS — I82402 Acute embolism and thrombosis of unspecified deep veins of left lower extremity: Secondary | ICD-10-CM | POA: Diagnosis not present

## 2020-01-25 DIAGNOSIS — I2510 Atherosclerotic heart disease of native coronary artery without angina pectoris: Secondary | ICD-10-CM | POA: Diagnosis not present

## 2020-01-25 DIAGNOSIS — I5022 Chronic systolic (congestive) heart failure: Secondary | ICD-10-CM | POA: Diagnosis not present

## 2020-01-25 DIAGNOSIS — M170 Bilateral primary osteoarthritis of knee: Secondary | ICD-10-CM | POA: Diagnosis not present

## 2020-01-25 DIAGNOSIS — I4821 Permanent atrial fibrillation: Secondary | ICD-10-CM | POA: Diagnosis not present

## 2020-01-25 DIAGNOSIS — I110 Hypertensive heart disease with heart failure: Secondary | ICD-10-CM | POA: Diagnosis not present

## 2020-01-27 DIAGNOSIS — I5022 Chronic systolic (congestive) heart failure: Secondary | ICD-10-CM | POA: Diagnosis not present

## 2020-01-27 DIAGNOSIS — M170 Bilateral primary osteoarthritis of knee: Secondary | ICD-10-CM | POA: Diagnosis not present

## 2020-01-27 DIAGNOSIS — I2510 Atherosclerotic heart disease of native coronary artery without angina pectoris: Secondary | ICD-10-CM | POA: Diagnosis not present

## 2020-01-27 DIAGNOSIS — I110 Hypertensive heart disease with heart failure: Secondary | ICD-10-CM | POA: Diagnosis not present

## 2020-01-27 DIAGNOSIS — I4821 Permanent atrial fibrillation: Secondary | ICD-10-CM | POA: Diagnosis not present

## 2020-01-27 DIAGNOSIS — I82402 Acute embolism and thrombosis of unspecified deep veins of left lower extremity: Secondary | ICD-10-CM | POA: Diagnosis not present

## 2020-01-30 DIAGNOSIS — J9611 Chronic respiratory failure with hypoxia: Secondary | ICD-10-CM | POA: Diagnosis not present

## 2020-01-30 DIAGNOSIS — I4821 Permanent atrial fibrillation: Secondary | ICD-10-CM | POA: Diagnosis not present

## 2020-01-30 DIAGNOSIS — M170 Bilateral primary osteoarthritis of knee: Secondary | ICD-10-CM | POA: Diagnosis not present

## 2020-01-30 DIAGNOSIS — I110 Hypertensive heart disease with heart failure: Secondary | ICD-10-CM | POA: Diagnosis not present

## 2020-01-30 DIAGNOSIS — I5022 Chronic systolic (congestive) heart failure: Secondary | ICD-10-CM | POA: Diagnosis not present

## 2020-01-30 DIAGNOSIS — Z299 Encounter for prophylactic measures, unspecified: Secondary | ICD-10-CM | POA: Diagnosis not present

## 2020-01-30 DIAGNOSIS — J069 Acute upper respiratory infection, unspecified: Secondary | ICD-10-CM | POA: Diagnosis not present

## 2020-01-30 DIAGNOSIS — I429 Cardiomyopathy, unspecified: Secondary | ICD-10-CM | POA: Diagnosis not present

## 2020-01-30 DIAGNOSIS — I82402 Acute embolism and thrombosis of unspecified deep veins of left lower extremity: Secondary | ICD-10-CM | POA: Diagnosis not present

## 2020-01-30 DIAGNOSIS — I2699 Other pulmonary embolism without acute cor pulmonale: Secondary | ICD-10-CM | POA: Diagnosis not present

## 2020-01-30 DIAGNOSIS — Z789 Other specified health status: Secondary | ICD-10-CM | POA: Diagnosis not present

## 2020-01-30 DIAGNOSIS — I2510 Atherosclerotic heart disease of native coronary artery without angina pectoris: Secondary | ICD-10-CM | POA: Diagnosis not present

## 2020-02-01 DIAGNOSIS — I5022 Chronic systolic (congestive) heart failure: Secondary | ICD-10-CM | POA: Diagnosis not present

## 2020-02-01 DIAGNOSIS — I2510 Atherosclerotic heart disease of native coronary artery without angina pectoris: Secondary | ICD-10-CM | POA: Diagnosis not present

## 2020-02-01 DIAGNOSIS — I110 Hypertensive heart disease with heart failure: Secondary | ICD-10-CM | POA: Diagnosis not present

## 2020-02-01 DIAGNOSIS — I82402 Acute embolism and thrombosis of unspecified deep veins of left lower extremity: Secondary | ICD-10-CM | POA: Diagnosis not present

## 2020-02-01 DIAGNOSIS — M170 Bilateral primary osteoarthritis of knee: Secondary | ICD-10-CM | POA: Diagnosis not present

## 2020-02-01 DIAGNOSIS — I4821 Permanent atrial fibrillation: Secondary | ICD-10-CM | POA: Diagnosis not present

## 2020-02-02 DIAGNOSIS — J9611 Chronic respiratory failure with hypoxia: Secondary | ICD-10-CM | POA: Diagnosis not present

## 2020-02-02 DIAGNOSIS — Z299 Encounter for prophylactic measures, unspecified: Secondary | ICD-10-CM | POA: Diagnosis not present

## 2020-02-02 DIAGNOSIS — I2699 Other pulmonary embolism without acute cor pulmonale: Secondary | ICD-10-CM | POA: Diagnosis not present

## 2020-02-02 DIAGNOSIS — I509 Heart failure, unspecified: Secondary | ICD-10-CM | POA: Diagnosis not present

## 2020-02-02 DIAGNOSIS — I5020 Unspecified systolic (congestive) heart failure: Secondary | ICD-10-CM | POA: Diagnosis not present

## 2020-02-03 ENCOUNTER — Ambulatory Visit: Payer: Medicare Other | Admitting: Gastroenterology

## 2020-02-03 DIAGNOSIS — I4821 Permanent atrial fibrillation: Secondary | ICD-10-CM | POA: Diagnosis not present

## 2020-02-03 DIAGNOSIS — I82402 Acute embolism and thrombosis of unspecified deep veins of left lower extremity: Secondary | ICD-10-CM | POA: Diagnosis not present

## 2020-02-03 DIAGNOSIS — M170 Bilateral primary osteoarthritis of knee: Secondary | ICD-10-CM | POA: Diagnosis not present

## 2020-02-03 DIAGNOSIS — I2510 Atherosclerotic heart disease of native coronary artery without angina pectoris: Secondary | ICD-10-CM | POA: Diagnosis not present

## 2020-02-06 DIAGNOSIS — M170 Bilateral primary osteoarthritis of knee: Secondary | ICD-10-CM | POA: Diagnosis not present

## 2020-02-06 DIAGNOSIS — I5022 Chronic systolic (congestive) heart failure: Secondary | ICD-10-CM | POA: Diagnosis not present

## 2020-02-06 DIAGNOSIS — I2510 Atherosclerotic heart disease of native coronary artery without angina pectoris: Secondary | ICD-10-CM | POA: Diagnosis not present

## 2020-02-06 DIAGNOSIS — I110 Hypertensive heart disease with heart failure: Secondary | ICD-10-CM | POA: Diagnosis not present

## 2020-02-06 DIAGNOSIS — I4821 Permanent atrial fibrillation: Secondary | ICD-10-CM | POA: Diagnosis not present

## 2020-02-06 DIAGNOSIS — I82402 Acute embolism and thrombosis of unspecified deep veins of left lower extremity: Secondary | ICD-10-CM | POA: Diagnosis not present

## 2020-02-07 DIAGNOSIS — M170 Bilateral primary osteoarthritis of knee: Secondary | ICD-10-CM | POA: Diagnosis not present

## 2020-02-07 DIAGNOSIS — I82402 Acute embolism and thrombosis of unspecified deep veins of left lower extremity: Secondary | ICD-10-CM | POA: Diagnosis not present

## 2020-02-07 DIAGNOSIS — I4821 Permanent atrial fibrillation: Secondary | ICD-10-CM | POA: Diagnosis not present

## 2020-02-07 DIAGNOSIS — I110 Hypertensive heart disease with heart failure: Secondary | ICD-10-CM | POA: Diagnosis not present

## 2020-02-07 DIAGNOSIS — I5022 Chronic systolic (congestive) heart failure: Secondary | ICD-10-CM | POA: Diagnosis not present

## 2020-02-07 DIAGNOSIS — I2510 Atherosclerotic heart disease of native coronary artery without angina pectoris: Secondary | ICD-10-CM | POA: Diagnosis not present

## 2020-02-08 DIAGNOSIS — Z7901 Long term (current) use of anticoagulants: Secondary | ICD-10-CM | POA: Diagnosis not present

## 2020-02-08 DIAGNOSIS — I2510 Atherosclerotic heart disease of native coronary artery without angina pectoris: Secondary | ICD-10-CM | POA: Diagnosis not present

## 2020-02-08 DIAGNOSIS — Z5181 Encounter for therapeutic drug level monitoring: Secondary | ICD-10-CM | POA: Diagnosis not present

## 2020-02-08 DIAGNOSIS — I4821 Permanent atrial fibrillation: Secondary | ICD-10-CM | POA: Diagnosis not present

## 2020-02-08 DIAGNOSIS — E119 Type 2 diabetes mellitus without complications: Secondary | ICD-10-CM | POA: Diagnosis not present

## 2020-02-08 DIAGNOSIS — I82402 Acute embolism and thrombosis of unspecified deep veins of left lower extremity: Secondary | ICD-10-CM | POA: Diagnosis not present

## 2020-02-08 DIAGNOSIS — I10 Essential (primary) hypertension: Secondary | ICD-10-CM | POA: Diagnosis not present

## 2020-02-08 DIAGNOSIS — M170 Bilateral primary osteoarthritis of knee: Secondary | ICD-10-CM | POA: Diagnosis not present

## 2020-02-08 DIAGNOSIS — I509 Heart failure, unspecified: Secondary | ICD-10-CM | POA: Diagnosis not present

## 2020-02-08 DIAGNOSIS — I5022 Chronic systolic (congestive) heart failure: Secondary | ICD-10-CM | POA: Diagnosis not present

## 2020-02-08 DIAGNOSIS — I110 Hypertensive heart disease with heart failure: Secondary | ICD-10-CM | POA: Diagnosis not present

## 2020-02-08 DIAGNOSIS — E7800 Pure hypercholesterolemia, unspecified: Secondary | ICD-10-CM | POA: Diagnosis not present

## 2020-02-09 DIAGNOSIS — I5022 Chronic systolic (congestive) heart failure: Secondary | ICD-10-CM | POA: Diagnosis not present

## 2020-02-09 DIAGNOSIS — I110 Hypertensive heart disease with heart failure: Secondary | ICD-10-CM | POA: Diagnosis not present

## 2020-02-09 DIAGNOSIS — I2510 Atherosclerotic heart disease of native coronary artery without angina pectoris: Secondary | ICD-10-CM | POA: Diagnosis not present

## 2020-02-09 DIAGNOSIS — M170 Bilateral primary osteoarthritis of knee: Secondary | ICD-10-CM | POA: Diagnosis not present

## 2020-02-09 DIAGNOSIS — I82402 Acute embolism and thrombosis of unspecified deep veins of left lower extremity: Secondary | ICD-10-CM | POA: Diagnosis not present

## 2020-02-09 DIAGNOSIS — I4821 Permanent atrial fibrillation: Secondary | ICD-10-CM | POA: Diagnosis not present

## 2020-02-13 DIAGNOSIS — I5022 Chronic systolic (congestive) heart failure: Secondary | ICD-10-CM | POA: Diagnosis not present

## 2020-02-13 DIAGNOSIS — I2510 Atherosclerotic heart disease of native coronary artery without angina pectoris: Secondary | ICD-10-CM | POA: Diagnosis not present

## 2020-02-13 DIAGNOSIS — I4821 Permanent atrial fibrillation: Secondary | ICD-10-CM | POA: Diagnosis not present

## 2020-02-13 DIAGNOSIS — I110 Hypertensive heart disease with heart failure: Secondary | ICD-10-CM | POA: Diagnosis not present

## 2020-02-13 DIAGNOSIS — I82402 Acute embolism and thrombosis of unspecified deep veins of left lower extremity: Secondary | ICD-10-CM | POA: Diagnosis not present

## 2020-02-13 DIAGNOSIS — M170 Bilateral primary osteoarthritis of knee: Secondary | ICD-10-CM | POA: Diagnosis not present

## 2020-02-14 DIAGNOSIS — M170 Bilateral primary osteoarthritis of knee: Secondary | ICD-10-CM | POA: Diagnosis not present

## 2020-02-14 DIAGNOSIS — I82402 Acute embolism and thrombosis of unspecified deep veins of left lower extremity: Secondary | ICD-10-CM | POA: Diagnosis not present

## 2020-02-14 DIAGNOSIS — I2510 Atherosclerotic heart disease of native coronary artery without angina pectoris: Secondary | ICD-10-CM | POA: Diagnosis not present

## 2020-02-14 DIAGNOSIS — I110 Hypertensive heart disease with heart failure: Secondary | ICD-10-CM | POA: Diagnosis not present

## 2020-02-14 DIAGNOSIS — I5022 Chronic systolic (congestive) heart failure: Secondary | ICD-10-CM | POA: Diagnosis not present

## 2020-02-14 DIAGNOSIS — I4821 Permanent atrial fibrillation: Secondary | ICD-10-CM | POA: Diagnosis not present

## 2020-02-16 DIAGNOSIS — I5022 Chronic systolic (congestive) heart failure: Secondary | ICD-10-CM | POA: Diagnosis not present

## 2020-02-16 DIAGNOSIS — I4821 Permanent atrial fibrillation: Secondary | ICD-10-CM | POA: Diagnosis not present

## 2020-02-16 DIAGNOSIS — M170 Bilateral primary osteoarthritis of knee: Secondary | ICD-10-CM | POA: Diagnosis not present

## 2020-02-16 DIAGNOSIS — I110 Hypertensive heart disease with heart failure: Secondary | ICD-10-CM | POA: Diagnosis not present

## 2020-02-16 DIAGNOSIS — I2510 Atherosclerotic heart disease of native coronary artery without angina pectoris: Secondary | ICD-10-CM | POA: Diagnosis not present

## 2020-02-16 DIAGNOSIS — I82402 Acute embolism and thrombosis of unspecified deep veins of left lower extremity: Secondary | ICD-10-CM | POA: Diagnosis not present

## 2020-02-19 DIAGNOSIS — R41841 Cognitive communication deficit: Secondary | ICD-10-CM | POA: Diagnosis not present

## 2020-02-19 DIAGNOSIS — I110 Hypertensive heart disease with heart failure: Secondary | ICD-10-CM | POA: Diagnosis not present

## 2020-02-19 DIAGNOSIS — Z7901 Long term (current) use of anticoagulants: Secondary | ICD-10-CM | POA: Diagnosis not present

## 2020-02-19 DIAGNOSIS — Z7984 Long term (current) use of oral hypoglycemic drugs: Secondary | ICD-10-CM | POA: Diagnosis not present

## 2020-02-19 DIAGNOSIS — E119 Type 2 diabetes mellitus without complications: Secondary | ICD-10-CM | POA: Diagnosis not present

## 2020-02-19 DIAGNOSIS — M6281 Muscle weakness (generalized): Secondary | ICD-10-CM | POA: Diagnosis not present

## 2020-02-19 DIAGNOSIS — Z86711 Personal history of pulmonary embolism: Secondary | ICD-10-CM | POA: Diagnosis not present

## 2020-02-19 DIAGNOSIS — M353 Polymyalgia rheumatica: Secondary | ICD-10-CM | POA: Diagnosis not present

## 2020-02-19 DIAGNOSIS — I340 Nonrheumatic mitral (valve) insufficiency: Secondary | ICD-10-CM | POA: Diagnosis not present

## 2020-02-19 DIAGNOSIS — I82402 Acute embolism and thrombosis of unspecified deep veins of left lower extremity: Secondary | ICD-10-CM | POA: Diagnosis not present

## 2020-02-19 DIAGNOSIS — E785 Hyperlipidemia, unspecified: Secondary | ICD-10-CM | POA: Diagnosis not present

## 2020-02-19 DIAGNOSIS — I4821 Permanent atrial fibrillation: Secondary | ICD-10-CM | POA: Diagnosis not present

## 2020-02-19 DIAGNOSIS — I2510 Atherosclerotic heart disease of native coronary artery without angina pectoris: Secondary | ICD-10-CM | POA: Diagnosis not present

## 2020-02-19 DIAGNOSIS — I5022 Chronic systolic (congestive) heart failure: Secondary | ICD-10-CM | POA: Diagnosis not present

## 2020-02-19 DIAGNOSIS — M170 Bilateral primary osteoarthritis of knee: Secondary | ICD-10-CM | POA: Diagnosis not present

## 2020-02-20 DIAGNOSIS — M170 Bilateral primary osteoarthritis of knee: Secondary | ICD-10-CM | POA: Diagnosis not present

## 2020-02-20 DIAGNOSIS — I4821 Permanent atrial fibrillation: Secondary | ICD-10-CM | POA: Diagnosis not present

## 2020-02-20 DIAGNOSIS — I5022 Chronic systolic (congestive) heart failure: Secondary | ICD-10-CM | POA: Diagnosis not present

## 2020-02-20 DIAGNOSIS — I82402 Acute embolism and thrombosis of unspecified deep veins of left lower extremity: Secondary | ICD-10-CM | POA: Diagnosis not present

## 2020-02-20 DIAGNOSIS — I2510 Atherosclerotic heart disease of native coronary artery without angina pectoris: Secondary | ICD-10-CM | POA: Diagnosis not present

## 2020-02-20 DIAGNOSIS — I110 Hypertensive heart disease with heart failure: Secondary | ICD-10-CM | POA: Diagnosis not present

## 2020-02-21 DIAGNOSIS — I4821 Permanent atrial fibrillation: Secondary | ICD-10-CM | POA: Diagnosis not present

## 2020-02-21 DIAGNOSIS — I2510 Atherosclerotic heart disease of native coronary artery without angina pectoris: Secondary | ICD-10-CM | POA: Diagnosis not present

## 2020-02-21 DIAGNOSIS — I82402 Acute embolism and thrombosis of unspecified deep veins of left lower extremity: Secondary | ICD-10-CM | POA: Diagnosis not present

## 2020-02-21 DIAGNOSIS — R791 Abnormal coagulation profile: Secondary | ICD-10-CM | POA: Diagnosis not present

## 2020-02-21 DIAGNOSIS — I5022 Chronic systolic (congestive) heart failure: Secondary | ICD-10-CM | POA: Diagnosis not present

## 2020-02-21 DIAGNOSIS — M170 Bilateral primary osteoarthritis of knee: Secondary | ICD-10-CM | POA: Diagnosis not present

## 2020-02-21 DIAGNOSIS — I110 Hypertensive heart disease with heart failure: Secondary | ICD-10-CM | POA: Diagnosis not present

## 2020-02-23 DIAGNOSIS — M170 Bilateral primary osteoarthritis of knee: Secondary | ICD-10-CM | POA: Diagnosis not present

## 2020-02-23 DIAGNOSIS — I82402 Acute embolism and thrombosis of unspecified deep veins of left lower extremity: Secondary | ICD-10-CM | POA: Diagnosis not present

## 2020-02-23 DIAGNOSIS — I5022 Chronic systolic (congestive) heart failure: Secondary | ICD-10-CM | POA: Diagnosis not present

## 2020-02-23 DIAGNOSIS — I4821 Permanent atrial fibrillation: Secondary | ICD-10-CM | POA: Diagnosis not present

## 2020-02-23 DIAGNOSIS — I2510 Atherosclerotic heart disease of native coronary artery without angina pectoris: Secondary | ICD-10-CM | POA: Diagnosis not present

## 2020-02-23 DIAGNOSIS — I110 Hypertensive heart disease with heart failure: Secondary | ICD-10-CM | POA: Diagnosis not present

## 2020-02-24 ENCOUNTER — Ambulatory Visit (INDEPENDENT_AMBULATORY_CARE_PROVIDER_SITE_OTHER): Payer: Medicare Other | Admitting: Gastroenterology

## 2020-02-24 ENCOUNTER — Encounter: Payer: Self-pay | Admitting: Gastroenterology

## 2020-02-24 ENCOUNTER — Other Ambulatory Visit: Payer: Self-pay

## 2020-02-24 VITALS — BP 160/86 | HR 78 | Temp 97.6°F | Ht 60.0 in | Wt 149.0 lb

## 2020-02-24 DIAGNOSIS — D649 Anemia, unspecified: Secondary | ICD-10-CM | POA: Diagnosis not present

## 2020-02-24 NOTE — Progress Notes (Signed)
Referring Provider: Glenda Chroman, MD Primary Care Physician:  Glenda Chroman, MD Primary GI: Dr. Oneida Alar   Chief Complaint  Patient presents with  . Anemia    f/u. needs to r/s procedures    HPI:   Rebecca Hanson is a 86 y.o. female presenting today with a history of normocytic anemia, previously seen in October at the request of Melina Copa, PA-C with cardiology. She was inpatient Sept 2020 with demand ischemia in setting of pneumonia and anemia, s/p cath with multivessel CAD with 50% ostial LAD stenosis followed by diffuse 80% proximal to mid LAD stenoses;80% proximal circumflex stenoses, and a dominant RCA which gives rise to a conus branch that has an 85% ostial conus branch stenosis, and otherwise no significant obstruction in the RCA which supplies a large PDA and PLA vessel.On chronic Coumadin for afib, history of DVT and PE, protein S deficiency. Medical therapy recommended.   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 She is present with her sister today Rebecca Hanson) to discuss procedures. She had been hospitalized with UTI at [facility] in interim from last visit, requiring rehab at [facility] thereafter. She returned home on 4/11. Ferritin was 143 in Sept 2020, and iron low normal at 34. CKD. Felt to have multifactorial anemia. Hgb in 2018 was 11.   Colonoscopy believed to be 5 years ago in [place]. No prior EGD. No overt GI bleeding. 2017 was 195. Today 149, stable from Oct 2020.   She denies melena, hematochezia. No abdominal pain. Appetite is not the best. No dysphagia. Chronic GERD. Has rare constipation and takes colace.   She is requesting that Dr. Laural Golden perform procedures in absence of Dr. Oneida Alar. Unknown hemoccult status.     Past Medical History:  Diagnosis Date  . A-fib ([place])   . CHF (congestive heart failure) ([place])   . CKD (chronic kidney disease)   . Diabetes mellitus without complication ([place])   . Dyslipidemia   . HTN  (hypertension)   . Polymyalgia rheumatica ([place])   . Protein S deficiency [facility]     Past Surgical History:  Procedure Laterality Date  . BREAST BIOPSY    . CARDIAC CATHETERIZATION    . CATARACT EXTRACTION    . RIGHT/LEFT HEART CATH AND CORONARY ANGIOGRAPHY N/A 07/28/2019   Procedure: RIGHT/LEFT HEART CATH AND CORONARY ANGIOGRAPHY;  Surgeon: Troy Sine, MD;  Location: [place] [facility];  Service: Cardiovascular;  Laterality: N/A;    Current Outpatient Medications  Medication Sig Dispense Refill  . Calcium Carbonate-Vitamin D (CALTRATE 600+D PO) Take 1 tablet by mouth daily.    . carvedilol (COREG) 12.5 MG tablet Take 12.5 mg by mouth 2 (two) times daily.     . cetirizine (ALL DAY ALLERGY) 10 MG tablet Take 10 mg by mouth daily.     Marland Kitchen docusate sodium (COLACE) 100 MG capsule Take 100 mg by mouth daily.    Marland Kitchen ENTRESTO 24-26 MG TAKE 1 TABLET BY MOUTH TWICE DAILY (Patient taking differently: Take 1 tablet by mouth 2 (two) times daily. ) 60 tablet 6  . furosemide (LASIX) 20 MG tablet Take 1 tablet (20 mg total) by mouth daily.    Marland Kitchen glimepiride (AMARYL) 1 MG tablet Take 1 mg by mouth daily with breakfast.    . metFORMIN (GLUCOPHAGE) 500 MG tablet Take 500 mg by mouth 2 (two) times daily.     . Multiple Vitamin (DAILY-VITE) TABS Take 1 tablet by mouth  daily.    . potassium chloride SA (K-DUR) 10 MEQ tablet Take 1 tablet (10 mEq total) by mouth daily.    . rosuvastatin (CRESTOR) 5 MG tablet Take 5 mg by mouth daily.    . traMADol (ULTRAM) 50 MG tablet Take by mouth every 6 (six) hours as needed.    . warfarin (COUMADIN) 4 MG tablet Take 1 tablet (4 mg total) by mouth daily for 5 days. Recheck INR 07/27/2019 (Patient not taking: Reported on 01/02/2020) 5 tablet 0   No current facility-administered medications for this visit.    Allergies as of 02/24/2020 - Review Complete 02/24/2020  Allergen Reaction Noted  . Benadryl [diphenhydramine hcl (sleep)] Other (See Comments)   . Hand sanitizer  [ethyl alcohol (skin cleanser)] Swelling 07/24/2019  . Adhesive [tape] Rash 07/24/2019  . Other Rash 07/24/2019  . Penicillins Hives, Swelling, and Rash 05/29/2016  . Soap Rash 07/24/2019  . Streptomycin sulfate [streptomycin] Hives, Swelling, and Rash 05/29/2016    Family History  Problem Relation Age of Onset  . Heart disease Father   . CAD Father   . CVA Father   . Colon polyps Father        > 96 years old   . Colon polyps Sister        > 39 years old   . Colon cancer Neg Hx     Social History   Socioeconomic History  . Marital status: Widowed    Spouse name: Not on file  . Number of children: Not on file  . Years of education: Not on file  . Highest education level: Not on file  Occupational History  . Not on file  Tobacco Use  . Smoking status: Never Smoker  . Smokeless tobacco: Never Used  Substance and Sexual Activity  . Alcohol use: No    Alcohol/week: 0.0 standard drinks  . Drug use: No  . Sexual activity: Not on file  Other Topics Concern  . Not on file  Social History Narrative  . Not on file   Social Determinants of Health   Financial Resource Strain:   . Difficulty of Paying Living Expenses:   Food Insecurity:   . Worried About Programme researcher, broadcasting/film/video in the Last Year:   . Barista in the Last Year:   Transportation Needs:   . Freight forwarder (Medical):   Marland Kitchen Lack of Transportation (Non-Medical):   Physical Activity:   . Days of Exercise per Week:   . Minutes of Exercise per Session:   Stress:   . Feeling of Stress :   Social Connections:   . Frequency of Communication with Friends and Family:   . Frequency of Social Gatherings with Friends and Family:   . Attends Religious Services:   . Active Member of Clubs or Organizations:   . Attends Banker Meetings:   Marland Kitchen Marital Status:     Review of Systems: Gen: see HPI CV: Denies chest pain, palpitations, syncope, peripheral edema, and claudication. Resp: Denies dyspnea  at rest, cough, wheezing, coughing up blood, and pleurisy. GI: see HPI Derm: Denies rash, itching, dry skin Psych: Denies depression, anxiety, memory loss, confusion. No homicidal or suicidal ideation.  Heme: see HPI  Physical Exam: BP (!) 160/86   Pulse 78   Temp 97.6 F (36.4 C) (Oral)   Ht 5' (1.524 m)   Wt 149 lb (67.6 kg)   BMI 29.10 kg/m  General:   Alert and oriented. No  distress noted. Frail appearing. Sitting in wheelchair Head:  Normocephalic and atraumatic. Eyes:  Conjuctiva clear without scleral icterus. Mouth: mask in place  Cardiac: S1 S2 present, irregularly irregular Abdomen:  +BS, soft, non-tender and non-distended. Limited evaluation with patient sitting in chair.  Msk:  With kyphosis. Able to walk short distances with walker.  Extremities:  With chronic LLE edema greater than right.  Neurologic:  Alert and  oriented x4 Psych:  Alert and cooperative. Normal mood and affect.  ASSESSMENT: Rebecca Hanson is an 86 y.o. female presenting today with normocytic anemia likely multifactorial in setting of CKD and unable to rule out IDA. Weight loss noted of almost 40 lbs since 2017. Hemoccult status unknown, but she has had no overt GI bleeding. I do note her Hgb was 11 in 2018, and most recently in the 9 range. Due to afib, history of DVT/PE, protein S deficiency, she is on chronic Coumadin. Cardiology had requested evaluation prior to initiating aspirin for medical therapy of multivessel CAD.   In interim from last appointment, she was in rehab again in [place] following a UTI. When she was initially sen in Oct 2020, she had also just returned from rehab. Initially, plan was to possibly pursue colonoscopy/EGD; however, in light of comorbidities and recurrent rehab, would hold off on colonoscopy and pursue EGD only. If any overt GI bleeding, transfusion dependent anemia, could pursue colonoscopy. Last lower GI evaluation reported 5-6 years ago by Dr. Gabriel Cirri. Patient is eager  to have endoscopic evaluation.   In absence of Dr. Darrick Penna, she is requesting that Dr. Karilyn Cota perform procedure. I will reach out to him regarding transferring care to his practice. I discussed with her that he would ultimately determine best diagnostic evaluation in this scenario in light of her history.    PLAN:   Check CBC and iron studies today  To discuss with Dr. Karilyn Cota transfer of care  Further recommendations to follow.   Gelene Mink, PhD, ANP-BC [facility]astro[facility]

## 2020-02-24 NOTE — Patient Instructions (Signed)
I have ordered blood work to be done in [place].   I will be speaking to Dr. Karilyn Cota further about the next steps.  I enjoyed seeing you again today! As you know, I value our relationship and want to provide genuine, compassionate, and quality care. I welcome your feedback. If you receive a survey regarding your visit,  I greatly appreciate you taking time to fill this out. See you next time!  Gelene Mink, PhD, ANP-BC [facility]astroenter[facility]

## 2020-02-27 DIAGNOSIS — I82402 Acute embolism and thrombosis of unspecified deep veins of left lower extremity: Secondary | ICD-10-CM | POA: Diagnosis not present

## 2020-02-27 DIAGNOSIS — I110 Hypertensive heart disease with heart failure: Secondary | ICD-10-CM | POA: Diagnosis not present

## 2020-02-27 DIAGNOSIS — I4821 Permanent atrial fibrillation: Secondary | ICD-10-CM | POA: Diagnosis not present

## 2020-02-27 DIAGNOSIS — M170 Bilateral primary osteoarthritis of knee: Secondary | ICD-10-CM | POA: Diagnosis not present

## 2020-02-27 DIAGNOSIS — I5022 Chronic systolic (congestive) heart failure: Secondary | ICD-10-CM | POA: Diagnosis not present

## 2020-02-27 DIAGNOSIS — I2510 Atherosclerotic heart disease of native coronary artery without angina pectoris: Secondary | ICD-10-CM | POA: Diagnosis not present

## 2020-02-29 DIAGNOSIS — I82402 Acute embolism and thrombosis of unspecified deep veins of left lower extremity: Secondary | ICD-10-CM | POA: Diagnosis not present

## 2020-02-29 DIAGNOSIS — I2510 Atherosclerotic heart disease of native coronary artery without angina pectoris: Secondary | ICD-10-CM | POA: Diagnosis not present

## 2020-02-29 DIAGNOSIS — I110 Hypertensive heart disease with heart failure: Secondary | ICD-10-CM | POA: Diagnosis not present

## 2020-02-29 DIAGNOSIS — I5022 Chronic systolic (congestive) heart failure: Secondary | ICD-10-CM | POA: Diagnosis not present

## 2020-02-29 DIAGNOSIS — I4821 Permanent atrial fibrillation: Secondary | ICD-10-CM | POA: Diagnosis not present

## 2020-02-29 DIAGNOSIS — M170 Bilateral primary osteoarthritis of knee: Secondary | ICD-10-CM | POA: Diagnosis not present

## 2020-03-01 DIAGNOSIS — I2510 Atherosclerotic heart disease of native coronary artery without angina pectoris: Secondary | ICD-10-CM | POA: Diagnosis not present

## 2020-03-01 DIAGNOSIS — I5022 Chronic systolic (congestive) heart failure: Secondary | ICD-10-CM | POA: Diagnosis not present

## 2020-03-01 DIAGNOSIS — I110 Hypertensive heart disease with heart failure: Secondary | ICD-10-CM | POA: Diagnosis not present

## 2020-03-01 DIAGNOSIS — M170 Bilateral primary osteoarthritis of knee: Secondary | ICD-10-CM | POA: Diagnosis not present

## 2020-03-01 DIAGNOSIS — I4821 Permanent atrial fibrillation: Secondary | ICD-10-CM | POA: Diagnosis not present

## 2020-03-01 DIAGNOSIS — I82402 Acute embolism and thrombosis of unspecified deep veins of left lower extremity: Secondary | ICD-10-CM | POA: Diagnosis not present

## 2020-03-02 ENCOUNTER — Telehealth: Payer: Self-pay | Admitting: Gastroenterology

## 2020-03-02 NOTE — Telephone Encounter (Signed)
Please let patient know I spoke with Dr. Karilyn Cota regarding transferring care. He will be seeing her and arranging an office visit in near future.

## 2020-03-05 DIAGNOSIS — D649 Anemia, unspecified: Secondary | ICD-10-CM | POA: Diagnosis not present

## 2020-03-05 DIAGNOSIS — Z1339 Encounter for screening examination for other mental health and behavioral disorders: Secondary | ICD-10-CM | POA: Diagnosis not present

## 2020-03-05 DIAGNOSIS — I4821 Permanent atrial fibrillation: Secondary | ICD-10-CM | POA: Diagnosis not present

## 2020-03-05 DIAGNOSIS — Z1331 Encounter for screening for depression: Secondary | ICD-10-CM | POA: Diagnosis not present

## 2020-03-05 DIAGNOSIS — R5383 Other fatigue: Secondary | ICD-10-CM | POA: Diagnosis not present

## 2020-03-05 DIAGNOSIS — Z299 Encounter for prophylactic measures, unspecified: Secondary | ICD-10-CM | POA: Diagnosis not present

## 2020-03-05 DIAGNOSIS — I2510 Atherosclerotic heart disease of native coronary artery without angina pectoris: Secondary | ICD-10-CM | POA: Diagnosis not present

## 2020-03-05 DIAGNOSIS — I5022 Chronic systolic (congestive) heart failure: Secondary | ICD-10-CM | POA: Diagnosis not present

## 2020-03-05 DIAGNOSIS — I110 Hypertensive heart disease with heart failure: Secondary | ICD-10-CM | POA: Diagnosis not present

## 2020-03-05 DIAGNOSIS — M170 Bilateral primary osteoarthritis of knee: Secondary | ICD-10-CM | POA: Diagnosis not present

## 2020-03-05 DIAGNOSIS — Z0000 Encounter for general adult medical examination without abnormal findings: Secondary | ICD-10-CM | POA: Diagnosis not present

## 2020-03-05 DIAGNOSIS — Z7189 Other specified counseling: Secondary | ICD-10-CM | POA: Diagnosis not present

## 2020-03-05 DIAGNOSIS — I82402 Acute embolism and thrombosis of unspecified deep veins of left lower extremity: Secondary | ICD-10-CM | POA: Diagnosis not present

## 2020-03-05 DIAGNOSIS — I2699 Other pulmonary embolism without acute cor pulmonale: Secondary | ICD-10-CM | POA: Diagnosis not present

## 2020-03-05 NOTE — Telephone Encounter (Signed)
Pt.notified

## 2020-03-13 DIAGNOSIS — M170 Bilateral primary osteoarthritis of knee: Secondary | ICD-10-CM | POA: Diagnosis not present

## 2020-03-13 DIAGNOSIS — I5022 Chronic systolic (congestive) heart failure: Secondary | ICD-10-CM | POA: Diagnosis not present

## 2020-03-13 DIAGNOSIS — I82402 Acute embolism and thrombosis of unspecified deep veins of left lower extremity: Secondary | ICD-10-CM | POA: Diagnosis not present

## 2020-03-13 DIAGNOSIS — I2510 Atherosclerotic heart disease of native coronary artery without angina pectoris: Secondary | ICD-10-CM | POA: Diagnosis not present

## 2020-03-13 DIAGNOSIS — I110 Hypertensive heart disease with heart failure: Secondary | ICD-10-CM | POA: Diagnosis not present

## 2020-03-13 DIAGNOSIS — I4821 Permanent atrial fibrillation: Secondary | ICD-10-CM | POA: Diagnosis not present

## 2020-03-15 DIAGNOSIS — I5022 Chronic systolic (congestive) heart failure: Secondary | ICD-10-CM | POA: Diagnosis not present

## 2020-03-15 DIAGNOSIS — I4821 Permanent atrial fibrillation: Secondary | ICD-10-CM | POA: Diagnosis not present

## 2020-03-15 DIAGNOSIS — I110 Hypertensive heart disease with heart failure: Secondary | ICD-10-CM | POA: Diagnosis not present

## 2020-03-15 DIAGNOSIS — I2510 Atherosclerotic heart disease of native coronary artery without angina pectoris: Secondary | ICD-10-CM | POA: Diagnosis not present

## 2020-03-15 DIAGNOSIS — M170 Bilateral primary osteoarthritis of knee: Secondary | ICD-10-CM | POA: Diagnosis not present

## 2020-03-15 DIAGNOSIS — I82402 Acute embolism and thrombosis of unspecified deep veins of left lower extremity: Secondary | ICD-10-CM | POA: Diagnosis not present

## 2020-03-20 DIAGNOSIS — I110 Hypertensive heart disease with heart failure: Secondary | ICD-10-CM | POA: Diagnosis not present

## 2020-03-20 DIAGNOSIS — R41841 Cognitive communication deficit: Secondary | ICD-10-CM | POA: Diagnosis not present

## 2020-03-20 DIAGNOSIS — Z86711 Personal history of pulmonary embolism: Secondary | ICD-10-CM | POA: Diagnosis not present

## 2020-03-20 DIAGNOSIS — E119 Type 2 diabetes mellitus without complications: Secondary | ICD-10-CM | POA: Diagnosis not present

## 2020-03-20 DIAGNOSIS — I2510 Atherosclerotic heart disease of native coronary artery without angina pectoris: Secondary | ICD-10-CM | POA: Diagnosis not present

## 2020-03-20 DIAGNOSIS — M6281 Muscle weakness (generalized): Secondary | ICD-10-CM | POA: Diagnosis not present

## 2020-03-20 DIAGNOSIS — I82402 Acute embolism and thrombosis of unspecified deep veins of left lower extremity: Secondary | ICD-10-CM | POA: Diagnosis not present

## 2020-03-20 DIAGNOSIS — Z7984 Long term (current) use of oral hypoglycemic drugs: Secondary | ICD-10-CM | POA: Diagnosis not present

## 2020-03-20 DIAGNOSIS — I340 Nonrheumatic mitral (valve) insufficiency: Secondary | ICD-10-CM | POA: Diagnosis not present

## 2020-03-20 DIAGNOSIS — M170 Bilateral primary osteoarthritis of knee: Secondary | ICD-10-CM | POA: Diagnosis not present

## 2020-03-20 DIAGNOSIS — E785 Hyperlipidemia, unspecified: Secondary | ICD-10-CM | POA: Diagnosis not present

## 2020-03-20 DIAGNOSIS — I5022 Chronic systolic (congestive) heart failure: Secondary | ICD-10-CM | POA: Diagnosis not present

## 2020-03-20 DIAGNOSIS — M353 Polymyalgia rheumatica: Secondary | ICD-10-CM | POA: Diagnosis not present

## 2020-03-20 DIAGNOSIS — I4821 Permanent atrial fibrillation: Secondary | ICD-10-CM | POA: Diagnosis not present

## 2020-03-20 DIAGNOSIS — Z7901 Long term (current) use of anticoagulants: Secondary | ICD-10-CM | POA: Diagnosis not present

## 2020-03-21 DIAGNOSIS — I110 Hypertensive heart disease with heart failure: Secondary | ICD-10-CM | POA: Diagnosis not present

## 2020-03-21 DIAGNOSIS — I5022 Chronic systolic (congestive) heart failure: Secondary | ICD-10-CM | POA: Diagnosis not present

## 2020-03-21 DIAGNOSIS — I4821 Permanent atrial fibrillation: Secondary | ICD-10-CM | POA: Diagnosis not present

## 2020-03-21 DIAGNOSIS — I82402 Acute embolism and thrombosis of unspecified deep veins of left lower extremity: Secondary | ICD-10-CM | POA: Diagnosis not present

## 2020-03-21 DIAGNOSIS — M170 Bilateral primary osteoarthritis of knee: Secondary | ICD-10-CM | POA: Diagnosis not present

## 2020-03-21 DIAGNOSIS — I2510 Atherosclerotic heart disease of native coronary artery without angina pectoris: Secondary | ICD-10-CM | POA: Diagnosis not present

## 2020-03-22 DIAGNOSIS — I2510 Atherosclerotic heart disease of native coronary artery without angina pectoris: Secondary | ICD-10-CM | POA: Diagnosis not present

## 2020-03-22 DIAGNOSIS — I4821 Permanent atrial fibrillation: Secondary | ICD-10-CM | POA: Diagnosis not present

## 2020-03-22 DIAGNOSIS — M170 Bilateral primary osteoarthritis of knee: Secondary | ICD-10-CM | POA: Diagnosis not present

## 2020-03-22 DIAGNOSIS — I82402 Acute embolism and thrombosis of unspecified deep veins of left lower extremity: Secondary | ICD-10-CM | POA: Diagnosis not present

## 2020-03-26 DIAGNOSIS — M170 Bilateral primary osteoarthritis of knee: Secondary | ICD-10-CM | POA: Diagnosis not present

## 2020-03-26 DIAGNOSIS — I110 Hypertensive heart disease with heart failure: Secondary | ICD-10-CM | POA: Diagnosis not present

## 2020-03-26 DIAGNOSIS — I5022 Chronic systolic (congestive) heart failure: Secondary | ICD-10-CM | POA: Diagnosis not present

## 2020-03-26 DIAGNOSIS — I4821 Permanent atrial fibrillation: Secondary | ICD-10-CM | POA: Diagnosis not present

## 2020-03-26 DIAGNOSIS — I2510 Atherosclerotic heart disease of native coronary artery without angina pectoris: Secondary | ICD-10-CM | POA: Diagnosis not present

## 2020-03-26 DIAGNOSIS — I82402 Acute embolism and thrombosis of unspecified deep veins of left lower extremity: Secondary | ICD-10-CM | POA: Diagnosis not present

## 2020-03-27 ENCOUNTER — Other Ambulatory Visit: Payer: Self-pay

## 2020-03-27 ENCOUNTER — Encounter (INDEPENDENT_AMBULATORY_CARE_PROVIDER_SITE_OTHER): Payer: Self-pay | Admitting: Internal Medicine

## 2020-03-27 ENCOUNTER — Ambulatory Visit (INDEPENDENT_AMBULATORY_CARE_PROVIDER_SITE_OTHER): Payer: Medicare Other | Admitting: Internal Medicine

## 2020-03-27 DIAGNOSIS — R1310 Dysphagia, unspecified: Secondary | ICD-10-CM | POA: Diagnosis not present

## 2020-03-27 DIAGNOSIS — D638 Anemia in other chronic diseases classified elsewhere: Secondary | ICD-10-CM

## 2020-03-27 DIAGNOSIS — R1319 Other dysphagia: Secondary | ICD-10-CM | POA: Insufficient documentation

## 2020-03-27 NOTE — Patient Instructions (Signed)
Barium pill study to be scheduled. Will request copy of recent blood work from [facility].

## 2020-03-27 NOTE — Progress Notes (Signed)
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 Woody Seller for GI evaluation. Patient is accompanied by her Sister [place] today. Patient has been evaluated at [place].  She was scheduled for EGD and colonoscopy in March 2021 but she decided not to proceed with these exams.  She  requested that her care be transferred to this office. Patient reports having low hemoglobin since September 2020.  She has not received blood transfusion.  She denies melena rectal bleeding or hematuria.  She also denies abdominal pain.  Patient reports weight loss of 50 pounds in September 2020 when she was admitted to [facility] hospital with non-STEMI.  Cardiac cath at time revealed multivessel coronary artery disease and it was felt that she would be best managed with medical therapy.  At the time of discharge she was transferred to nursing home for rehab and was able to go home in October 2020.  However she did not do well and she was back in a nursing home in January 2021 and finally returned home in March 2021.  She states she has stopped losing weight.  There has been no change in her weight in the last 1 month.  Patient complains of frequent if not daily dysphagia.  She feels as if she has a lump in her chest on eating.  When she regurgitates in her vomit she feels better.  She has not experienced hematemesis or heartburn.  She says she is a fast eater. She lives alone.  She has services of caregivers.  She is able to walk some with a walker and help by her caregivers.  She has not had any falling episodes.  She is also receiving physical therapy once a week at home. Patient states she has had 2 colonoscopies in the past.  Both of these exams were performed at [facility].  Last exam was maybe 5 or 6 years ago.     Current Medications: Outpatient Encounter  Medications as of 03/27/2020  Medication Sig  . Calcium Carbonate-Vitamin D (CALTRATE 600+D PO) Take 2 tablets by mouth daily.   . carvedilol (COREG) 12.5 MG tablet Take 12.5 mg by mouth 2 (two) times daily.   . cetirizine (ALL DAY ALLERGY) 10 MG tablet Take 10 mg by mouth daily.   Marland Kitchen docusate sodium (COLACE) 100 MG capsule Take 100 mg by mouth daily.  Marland Kitchen ENTRESTO 24-26 MG TAKE 1 TABLET BY MOUTH TWICE DAILY (Patient taking differently: Take 1 tablet by mouth 2 (two) times daily. )  . furosemide (LASIX) 20 MG tablet Take 1 tablet (20 mg total) by mouth daily. (Patient taking differently: Take 40 mg by mouth daily. )  . glimepiride (AMARYL) 1 MG tablet Take 1 mg by mouth daily with breakfast.  . metFORMIN (GLUCOPHAGE) 500 MG tablet Take 500 mg by mouth 2 (two) times daily.   . Multiple Vitamin (DAILY-VITE) TABS Take 1 tablet by mouth daily.  . potassium chloride SA (K-DUR) 10 MEQ tablet Take 1 tablet (10 mEq total) by mouth daily.  . rosuvastatin (CRESTOR) 5 MG tablet Take 5 mg by mouth daily.  Marland Kitchen warfarin (COUMADIN) 1 MG tablet   . [DISCONTINUED] traMADol (ULTRAM) 50 MG tablet Take by mouth every 6 (six) hours as needed.  . [DISCONTINUED] warfarin (COUMADIN) 2 MG tablet Take 2-4 mg by mouth daily.  . [DISCONTINUED] warfarin (COUMADIN) 4 MG tablet Take 1 tablet (4  mg total) by mouth daily for 5 days. Recheck INR 07/27/2019 (Patient not taking: Reported on 01/02/2020)   No facility-administered encounter medications on file as of 03/27/2020.   Past medical history  Past Medical History:  Diagnosis Date  . A-fib ([place])   . CHF (congestive heart failure) ([place])   . CKD (chronic kidney disease)   . Diabetes mellitus without complication ([place])   . Dyslipidemia   . HTN (hypertension)   . Polymyalgia rheumatica ([place])   . Protein S deficiency [facility])    Past Surgical History:  Procedure Laterality Date  . BREAST BIOPSY    . CARDIAC CATHETERIZATION    . CATARACT EXTRACTION    . RIGHT/LEFT HEART CATH  AND CORONARY ANGIOGRAPHY N/A 07/28/2019   Procedure: RIGHT/LEFT HEART CATH AND CORONARY ANGIOGRAPHY;  Surgeon: Troy Sine, MD;  Location: [place] [facility];  Service: Cardiovascular;  Laterality: N/A;    Allergies  Allergies  Allergen Reactions  . Benadryl [Diphenhydramine Hcl (Sleep)] Other (See Comments)    "Makes me wild, edgy acting"  . Hand Sanitizer [Ethyl Alcohol (Skin Cleanser)] Swelling    Severe swelling  . Adhesive [Tape] Rash  . Other Rash  . Penicillins Hives, Swelling and Rash    Did it involve swelling of the face/tongue/throat, SOB, or low BP? Did it involve sudden or severe rash/hives, skin peeling, or any reaction on the inside of your mouth or nose?  Did you need to seek medical attention at a hospital or doctor's office?  When did it last happen? If all above answers are "NO", may proceed with cephalosporin use..  . Soap Rash  . Streptomycin Sulfate [Streptomycin] Hives, Swelling and Rash    Family history  Father lived to be 73. Had coronary artery disease and CVA. Mother had coronary artery disease and osteoarthrosis. She lived to be 58. She has 4 sisters living. 1 has CAD and one has protein C deficiency. She has 2 brothers and they are both disease.  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 She is widowed. She does not have any children. She lives alone but she has help of caregivers. She has never smoked cigarettes and she does not drink alcohol.  Physical examination  Blood pressure 106/60, pulse 61, temperature (!) 97.2 F (36.2 C), temperature source Temporal, height 5' (1.524 m), weight 148 lb (67.1 kg). Patient is alert and in no acute distress. She is wearing a mask and sitting in a wheelchair. Conjunctiva is pink. Sclera is nonicteric Oropharyngeal mucosa is normal. Dentition in satisfactory condition.  Some of her teeth are missing. No neck masses or thyromegaly noted. Cardiac exam with irregular  rhythm normal S1 and loud S2.  No murmur or gallop noted. Lungs are clear to auscultation. Abdominal exam was performed in in sitting position.  Abdomen is full but soft and nontender with organomegaly or masses. She has trace edema around her ankles. She has resting tremor to her right hand.  Labs/studies Results:  CBC Latest Ref Rng & Units 07/29/2019 07/28/2019 07/28/2019  WBC 4.0 - 10.5 K/uL 7.4 - -  Hemoglobin 12.0 - 15.0 g/dL 9.3(L) 9.5(L) 9.5(L)  Hematocrit 36.0 - 46.0 % 29.9(L) 28.0(L) 28.0(L)  Platelets 150 - 400 K/uL 191 - -    CMP Latest Ref Rng & Units 07/29/2019 07/28/2019 07/28/2019  Glucose 70 - 99 mg/dL 148(H) - -  BUN 8 - 23 mg/dL 23 - -  Creatinine 0.44 - 1.00 mg/dL  1.06(H) - -  Sodium 135 - 145 mmol/L 137 138 139  Potassium 3.5 - 5.1 mmol/L 4.8 4.2 4.3  Chloride 98 - 111 mmol/L 106 - -  CO2 22 - 32 mmol/L 24 - -  Calcium 8.9 - 10.3 mg/dL 8.8(L) - -  Total Protein 6.5 - 8.1 g/dL - - -  Total Bilirubin 0.3 - 1.2 mg/dL - - -  Alkaline Phos 38 - 126 U/L - - -  AST 15 - 41 U/L - - -  ALT 0 - 44 U/L - - -    Hepatic Function Latest Ref Rng & Units 07/24/2019 04/20/2017  Total Protein 6.5 - 8.1 g/dL 5.4(L) 6.6  Albumin 3.5 - 5.0 g/dL 2.7(L) 3.9  AST 15 - 41 U/L 26 20  ALT 0 - 44 U/L 12 13  Alk Phosphatase 38 - 126 U/L 49 57  Total Bilirubin 0.3 - 1.2 mg/dL 0.6 0.5    Lab data from 07/27/2019.  Serum iron 34 TIBC 214 and saturation 16%. Serum ferritin 143. Serum folate level 23.7  Serum B12 407 pg/mL   Lab data from April 2021 not available at this time.  Assessment:  #1. Anemia. Patient's anemia appears to be due to chronic disease. She has had normal iron studies B12 and folate levels. There is no evidence of GI blood loss.  Since patient is chronically anticoagulated because of atrial fibrillation and protein C deficiency I would expect more than a trickle if she was to bleed.  #2. Dysphagia. She appears to be having this symptom frequently if not daily. She  possibly has esophageal motility disorder but need to rule out esophageal stricture or diverticulum.  #3.  Weight loss.  Since her hospitalization in September 2020 and subsequent prolonged stay at nursing home she has lost 50 pounds.  Weight loss seem to have leveled off.  Weight loss would appear to be due to her sickness and the fact that she did not like nursing home food at all.  Recommendations  Barium pill esophagogram. Will request recent blood work for review. Patient advised to call office if she has melena or rectal bleeding. Follow-up on as-needed basis.

## 2020-03-30 ENCOUNTER — Other Ambulatory Visit: Payer: Self-pay

## 2020-03-30 ENCOUNTER — Ambulatory Visit (HOSPITAL_COMMUNITY)
Admission: RE | Admit: 2020-03-30 | Discharge: 2020-03-30 | Disposition: A | Discharge status: Home / Self Care | Payer: Medicare Other | Source: Ambulatory Visit | Attending: Internal Medicine | Admitting: Internal Medicine

## 2020-03-30 DIAGNOSIS — R1310 Dysphagia, unspecified: Secondary | ICD-10-CM | POA: Insufficient documentation

## 2020-03-30 DIAGNOSIS — K224 Dyskinesia of esophagus: Secondary | ICD-10-CM | POA: Diagnosis not present

## 2020-04-04 DIAGNOSIS — I110 Hypertensive heart disease with heart failure: Secondary | ICD-10-CM | POA: Diagnosis not present

## 2020-04-04 DIAGNOSIS — M170 Bilateral primary osteoarthritis of knee: Secondary | ICD-10-CM | POA: Diagnosis not present

## 2020-04-04 DIAGNOSIS — I2510 Atherosclerotic heart disease of native coronary artery without angina pectoris: Secondary | ICD-10-CM | POA: Diagnosis not present

## 2020-04-04 DIAGNOSIS — I4821 Permanent atrial fibrillation: Secondary | ICD-10-CM | POA: Diagnosis not present

## 2020-04-04 DIAGNOSIS — I5022 Chronic systolic (congestive) heart failure: Secondary | ICD-10-CM | POA: Diagnosis not present

## 2020-04-04 DIAGNOSIS — I82402 Acute embolism and thrombosis of unspecified deep veins of left lower extremity: Secondary | ICD-10-CM | POA: Diagnosis not present

## 2020-04-06 DIAGNOSIS — E43 Unspecified severe protein-calorie malnutrition: Secondary | ICD-10-CM | POA: Diagnosis not present

## 2020-04-06 DIAGNOSIS — E1165 Type 2 diabetes mellitus with hyperglycemia: Secondary | ICD-10-CM | POA: Diagnosis not present

## 2020-04-06 DIAGNOSIS — I2699 Other pulmonary embolism without acute cor pulmonale: Secondary | ICD-10-CM | POA: Diagnosis not present

## 2020-04-06 DIAGNOSIS — Z299 Encounter for prophylactic measures, unspecified: Secondary | ICD-10-CM | POA: Diagnosis not present

## 2020-04-06 DIAGNOSIS — I10 Essential (primary) hypertension: Secondary | ICD-10-CM | POA: Diagnosis not present

## 2020-04-06 DIAGNOSIS — Z6832 Body mass index (BMI) 32.0-32.9, adult: Secondary | ICD-10-CM | POA: Diagnosis not present

## 2020-04-06 DIAGNOSIS — I429 Cardiomyopathy, unspecified: Secondary | ICD-10-CM | POA: Diagnosis not present

## 2020-04-11 DIAGNOSIS — I110 Hypertensive heart disease with heart failure: Secondary | ICD-10-CM | POA: Diagnosis not present

## 2020-04-11 DIAGNOSIS — I2510 Atherosclerotic heart disease of native coronary artery without angina pectoris: Secondary | ICD-10-CM | POA: Diagnosis not present

## 2020-04-11 DIAGNOSIS — I82402 Acute embolism and thrombosis of unspecified deep veins of left lower extremity: Secondary | ICD-10-CM | POA: Diagnosis not present

## 2020-04-11 DIAGNOSIS — I4821 Permanent atrial fibrillation: Secondary | ICD-10-CM | POA: Diagnosis not present

## 2020-04-11 DIAGNOSIS — M170 Bilateral primary osteoarthritis of knee: Secondary | ICD-10-CM | POA: Diagnosis not present

## 2020-04-11 DIAGNOSIS — I5022 Chronic systolic (congestive) heart failure: Secondary | ICD-10-CM | POA: Diagnosis not present

## 2020-04-12 ENCOUNTER — Ambulatory Visit (INDEPENDENT_AMBULATORY_CARE_PROVIDER_SITE_OTHER): Payer: Medicare Other | Admitting: Internal Medicine

## 2020-04-18 DIAGNOSIS — M170 Bilateral primary osteoarthritis of knee: Secondary | ICD-10-CM | POA: Diagnosis not present

## 2020-04-18 DIAGNOSIS — I4821 Permanent atrial fibrillation: Secondary | ICD-10-CM | POA: Diagnosis not present

## 2020-04-18 DIAGNOSIS — I5022 Chronic systolic (congestive) heart failure: Secondary | ICD-10-CM | POA: Diagnosis not present

## 2020-04-18 DIAGNOSIS — I82402 Acute embolism and thrombosis of unspecified deep veins of left lower extremity: Secondary | ICD-10-CM | POA: Diagnosis not present

## 2020-04-18 DIAGNOSIS — I2510 Atherosclerotic heart disease of native coronary artery without angina pectoris: Secondary | ICD-10-CM | POA: Diagnosis not present

## 2020-04-18 DIAGNOSIS — I110 Hypertensive heart disease with heart failure: Secondary | ICD-10-CM | POA: Diagnosis not present

## 2020-04-19 DIAGNOSIS — R41841 Cognitive communication deficit: Secondary | ICD-10-CM | POA: Diagnosis not present

## 2020-04-19 DIAGNOSIS — Z86711 Personal history of pulmonary embolism: Secondary | ICD-10-CM | POA: Diagnosis not present

## 2020-04-19 DIAGNOSIS — I82402 Acute embolism and thrombosis of unspecified deep veins of left lower extremity: Secondary | ICD-10-CM | POA: Diagnosis not present

## 2020-04-19 DIAGNOSIS — Z7984 Long term (current) use of oral hypoglycemic drugs: Secondary | ICD-10-CM | POA: Diagnosis not present

## 2020-04-19 DIAGNOSIS — M353 Polymyalgia rheumatica: Secondary | ICD-10-CM | POA: Diagnosis not present

## 2020-04-19 DIAGNOSIS — I110 Hypertensive heart disease with heart failure: Secondary | ICD-10-CM | POA: Diagnosis not present

## 2020-04-19 DIAGNOSIS — E785 Hyperlipidemia, unspecified: Secondary | ICD-10-CM | POA: Diagnosis not present

## 2020-04-19 DIAGNOSIS — I2510 Atherosclerotic heart disease of native coronary artery without angina pectoris: Secondary | ICD-10-CM | POA: Diagnosis not present

## 2020-04-19 DIAGNOSIS — I340 Nonrheumatic mitral (valve) insufficiency: Secondary | ICD-10-CM | POA: Diagnosis not present

## 2020-04-19 DIAGNOSIS — M6281 Muscle weakness (generalized): Secondary | ICD-10-CM | POA: Diagnosis not present

## 2020-04-19 DIAGNOSIS — I5022 Chronic systolic (congestive) heart failure: Secondary | ICD-10-CM | POA: Diagnosis not present

## 2020-04-19 DIAGNOSIS — I4821 Permanent atrial fibrillation: Secondary | ICD-10-CM | POA: Diagnosis not present

## 2020-04-19 DIAGNOSIS — E119 Type 2 diabetes mellitus without complications: Secondary | ICD-10-CM | POA: Diagnosis not present

## 2020-04-19 DIAGNOSIS — Z7901 Long term (current) use of anticoagulants: Secondary | ICD-10-CM | POA: Diagnosis not present

## 2020-04-19 DIAGNOSIS — M170 Bilateral primary osteoarthritis of knee: Secondary | ICD-10-CM | POA: Diagnosis not present

## 2020-04-26 DIAGNOSIS — I82402 Acute embolism and thrombosis of unspecified deep veins of left lower extremity: Secondary | ICD-10-CM | POA: Diagnosis not present

## 2020-04-26 DIAGNOSIS — M170 Bilateral primary osteoarthritis of knee: Secondary | ICD-10-CM | POA: Diagnosis not present

## 2020-04-26 DIAGNOSIS — I2510 Atherosclerotic heart disease of native coronary artery without angina pectoris: Secondary | ICD-10-CM | POA: Diagnosis not present

## 2020-04-26 DIAGNOSIS — I4821 Permanent atrial fibrillation: Secondary | ICD-10-CM | POA: Diagnosis not present

## 2020-04-26 DIAGNOSIS — I5022 Chronic systolic (congestive) heart failure: Secondary | ICD-10-CM | POA: Diagnosis not present

## 2020-04-26 DIAGNOSIS — I110 Hypertensive heart disease with heart failure: Secondary | ICD-10-CM | POA: Diagnosis not present

## 2020-04-30 DIAGNOSIS — M170 Bilateral primary osteoarthritis of knee: Secondary | ICD-10-CM | POA: Diagnosis not present

## 2020-04-30 DIAGNOSIS — I4821 Permanent atrial fibrillation: Secondary | ICD-10-CM | POA: Diagnosis not present

## 2020-04-30 DIAGNOSIS — I5022 Chronic systolic (congestive) heart failure: Secondary | ICD-10-CM | POA: Diagnosis not present

## 2020-04-30 DIAGNOSIS — I2510 Atherosclerotic heart disease of native coronary artery without angina pectoris: Secondary | ICD-10-CM | POA: Diagnosis not present

## 2020-04-30 DIAGNOSIS — I110 Hypertensive heart disease with heart failure: Secondary | ICD-10-CM | POA: Diagnosis not present

## 2020-04-30 DIAGNOSIS — I82402 Acute embolism and thrombosis of unspecified deep veins of left lower extremity: Secondary | ICD-10-CM | POA: Diagnosis not present

## 2020-05-09 DIAGNOSIS — I10 Essential (primary) hypertension: Secondary | ICD-10-CM | POA: Diagnosis not present

## 2020-05-09 DIAGNOSIS — E7800 Pure hypercholesterolemia, unspecified: Secondary | ICD-10-CM | POA: Diagnosis not present

## 2020-05-09 DIAGNOSIS — I509 Heart failure, unspecified: Secondary | ICD-10-CM | POA: Diagnosis not present

## 2020-05-09 DIAGNOSIS — E119 Type 2 diabetes mellitus without complications: Secondary | ICD-10-CM | POA: Diagnosis not present

## 2020-05-11 DIAGNOSIS — I110 Hypertensive heart disease with heart failure: Secondary | ICD-10-CM | POA: Diagnosis not present

## 2020-05-11 DIAGNOSIS — M170 Bilateral primary osteoarthritis of knee: Secondary | ICD-10-CM | POA: Diagnosis not present

## 2020-05-11 DIAGNOSIS — I2510 Atherosclerotic heart disease of native coronary artery without angina pectoris: Secondary | ICD-10-CM | POA: Diagnosis not present

## 2020-05-11 DIAGNOSIS — I4821 Permanent atrial fibrillation: Secondary | ICD-10-CM | POA: Diagnosis not present

## 2020-05-11 DIAGNOSIS — I5022 Chronic systolic (congestive) heart failure: Secondary | ICD-10-CM | POA: Diagnosis not present

## 2020-05-11 DIAGNOSIS — I82402 Acute embolism and thrombosis of unspecified deep veins of left lower extremity: Secondary | ICD-10-CM | POA: Diagnosis not present

## 2020-05-15 DIAGNOSIS — I4821 Permanent atrial fibrillation: Secondary | ICD-10-CM | POA: Diagnosis not present

## 2020-05-15 DIAGNOSIS — I2510 Atherosclerotic heart disease of native coronary artery without angina pectoris: Secondary | ICD-10-CM | POA: Diagnosis not present

## 2020-05-15 DIAGNOSIS — I82402 Acute embolism and thrombosis of unspecified deep veins of left lower extremity: Secondary | ICD-10-CM | POA: Diagnosis not present

## 2020-05-15 DIAGNOSIS — I110 Hypertensive heart disease with heart failure: Secondary | ICD-10-CM | POA: Diagnosis not present

## 2020-05-15 DIAGNOSIS — I5022 Chronic systolic (congestive) heart failure: Secondary | ICD-10-CM | POA: Diagnosis not present

## 2020-05-15 DIAGNOSIS — M170 Bilateral primary osteoarthritis of knee: Secondary | ICD-10-CM | POA: Diagnosis not present

## 2020-05-23 DIAGNOSIS — Z961 Presence of intraocular lens: Secondary | ICD-10-CM | POA: Diagnosis not present

## 2020-05-23 DIAGNOSIS — H04123 Dry eye syndrome of bilateral lacrimal glands: Secondary | ICD-10-CM | POA: Diagnosis not present

## 2020-05-23 DIAGNOSIS — E119 Type 2 diabetes mellitus without complications: Secondary | ICD-10-CM | POA: Diagnosis not present

## 2020-05-23 DIAGNOSIS — H40013 Open angle with borderline findings, low risk, bilateral: Secondary | ICD-10-CM | POA: Diagnosis not present

## 2020-06-07 DIAGNOSIS — D696 Thrombocytopenia, unspecified: Secondary | ICD-10-CM | POA: Diagnosis not present

## 2020-06-07 DIAGNOSIS — D6869 Other thrombophilia: Secondary | ICD-10-CM | POA: Diagnosis not present

## 2020-06-07 DIAGNOSIS — I2699 Other pulmonary embolism without acute cor pulmonale: Secondary | ICD-10-CM | POA: Diagnosis not present

## 2020-06-07 DIAGNOSIS — I10 Essential (primary) hypertension: Secondary | ICD-10-CM | POA: Diagnosis not present

## 2020-06-07 DIAGNOSIS — Z299 Encounter for prophylactic measures, unspecified: Secondary | ICD-10-CM | POA: Diagnosis not present

## 2020-06-08 DIAGNOSIS — E119 Type 2 diabetes mellitus without complications: Secondary | ICD-10-CM | POA: Diagnosis not present

## 2020-06-08 DIAGNOSIS — I10 Essential (primary) hypertension: Secondary | ICD-10-CM | POA: Diagnosis not present

## 2020-06-08 DIAGNOSIS — I509 Heart failure, unspecified: Secondary | ICD-10-CM | POA: Diagnosis not present

## 2020-06-08 DIAGNOSIS — E7800 Pure hypercholesterolemia, unspecified: Secondary | ICD-10-CM | POA: Diagnosis not present

## 2020-06-28 ENCOUNTER — Other Ambulatory Visit: Payer: Self-pay | Admitting: *Deleted

## 2020-06-28 MED ORDER — ENTRESTO 24-26 MG PO TABS: 1.0000 | ORAL_TABLET | Freq: Two times a day (BID) | ORAL | 1 refills | Status: DC

## 2020-07-06 DIAGNOSIS — I10 Essential (primary) hypertension: Secondary | ICD-10-CM | POA: Diagnosis not present

## 2020-07-06 DIAGNOSIS — I5020 Unspecified systolic (congestive) heart failure: Secondary | ICD-10-CM | POA: Diagnosis not present

## 2020-07-06 DIAGNOSIS — Z299 Encounter for prophylactic measures, unspecified: Secondary | ICD-10-CM | POA: Diagnosis not present

## 2020-07-06 DIAGNOSIS — I2699 Other pulmonary embolism without acute cor pulmonale: Secondary | ICD-10-CM | POA: Diagnosis not present

## 2020-08-07 DIAGNOSIS — E1165 Type 2 diabetes mellitus with hyperglycemia: Secondary | ICD-10-CM | POA: Diagnosis not present

## 2020-08-07 DIAGNOSIS — J9611 Chronic respiratory failure with hypoxia: Secondary | ICD-10-CM | POA: Diagnosis not present

## 2020-08-07 DIAGNOSIS — I429 Cardiomyopathy, unspecified: Secondary | ICD-10-CM | POA: Diagnosis not present

## 2020-08-07 DIAGNOSIS — I10 Essential (primary) hypertension: Secondary | ICD-10-CM | POA: Diagnosis not present

## 2020-08-07 DIAGNOSIS — I2699 Other pulmonary embolism without acute cor pulmonale: Secondary | ICD-10-CM | POA: Diagnosis not present

## 2020-08-07 DIAGNOSIS — Z299 Encounter for prophylactic measures, unspecified: Secondary | ICD-10-CM | POA: Diagnosis not present

## 2020-08-09 DIAGNOSIS — I10 Essential (primary) hypertension: Secondary | ICD-10-CM | POA: Diagnosis not present

## 2020-08-09 DIAGNOSIS — E7800 Pure hypercholesterolemia, unspecified: Secondary | ICD-10-CM | POA: Diagnosis not present

## 2020-08-09 DIAGNOSIS — E119 Type 2 diabetes mellitus without complications: Secondary | ICD-10-CM | POA: Diagnosis not present

## 2020-08-09 DIAGNOSIS — I509 Heart failure, unspecified: Secondary | ICD-10-CM | POA: Diagnosis not present

## 2020-08-16 ENCOUNTER — Encounter: Payer: Self-pay | Admitting: Cardiology

## 2020-08-16 ENCOUNTER — Ambulatory Visit (INDEPENDENT_AMBULATORY_CARE_PROVIDER_SITE_OTHER): Payer: Medicare Other | Admitting: Cardiology

## 2020-08-16 VITALS — BP 142/70 | HR 63 | Ht 60.0 in | Wt 146.0 lb

## 2020-08-16 DIAGNOSIS — I5022 Chronic systolic (congestive) heart failure: Secondary | ICD-10-CM

## 2020-08-16 DIAGNOSIS — I25119 Atherosclerotic heart disease of native coronary artery with unspecified angina pectoris: Secondary | ICD-10-CM | POA: Diagnosis not present

## 2020-08-16 DIAGNOSIS — I4821 Permanent atrial fibrillation: Secondary | ICD-10-CM | POA: Diagnosis not present

## 2020-08-16 NOTE — Progress Notes (Signed)
Cardiology Office Note  Date: 08/16/2020   ID: Rebecca Hanson, DOB May 18, 1932, MRN [id number]  PCP:  Ignatius Specking, MD  Cardiologist:  Nona Dell, MD Electrophysiologist:  None   Chief Complaint  Patient presents with  . Cardiac follow-up    History of Present Illness: Rebecca Hanson is an 86 y.o. female former patient of Dr. Purvis Sheffield now presenting to establish follow-up with me.  I reviewed her records and updated the chart.  She was last evaluated in October 2020.  She is here today with one of her sisters.  She does not report any significant sense of palpitations or increasing shortness of breath of late.  She is in a wheelchair today, states that she does use a walker and a cane and is able to ambulate.  She was in the [facility] nursing center earlier in the year, discharged after PT back in April.  She lives with one of her sisters.  She is on Coumadin followed by Dr. Sherril Croon.  She states that she follows monthly, does not report any active bleeding problems.  I reviewed her medications which are outlined below.  ECG today shows rate controlled atrial fibrillation with borderline low voltage.  Past Medical History:  Diagnosis Date  . Atrial fibrillation (HCC)   . Cardiomyopathy (HCC)   . CKD (chronic kidney disease), stage III (HCC)   . Essential hypertension   . Hyperlipidemia   . Polymyalgia rheumatica (HCC)   . Protein S deficiency (HCC)   . Type 2 diabetes mellitus (HCC)     Past Surgical History:  Procedure Laterality Date  . BREAST BIOPSY    . CARDIAC CATHETERIZATION    . CATARACT EXTRACTION    . RIGHT/LEFT HEART CATH AND CORONARY ANGIOGRAPHY N/A 07/28/2019   Procedure: RIGHT/LEFT HEART CATH AND CORONARY ANGIOGRAPHY;  Surgeon: Lennette Bihari, MD;  [facility]: [facility];  Service: Cardiovascular;  Laterality: N/A;    Current Outpatient Medications  Medication Sig Dispense Refill  . Calcium Carbonate-Vitamin D (CALTRATE 600+D PO) Take 2  tablets by mouth daily.     . carvedilol (COREG) 12.5 MG tablet Take 12.5 mg by mouth 2 (two) times daily.     . cetirizine (ALL DAY ALLERGY) 10 MG tablet Take 10 mg by mouth daily.     Marland Kitchen docusate sodium (COLACE) 100 MG capsule Take 100 mg by mouth daily.    . furosemide (LASIX) 20 MG tablet Take 1 tablet (20 mg total) by mouth daily. (Patient taking differently: Take 40 mg by mouth daily. )    . glimepiride (AMARYL) 1 MG tablet Take 1 mg by mouth daily with breakfast.    . metFORMIN (GLUCOPHAGE) 500 MG tablet Take 500 mg by mouth 2 (two) times daily.     . Multiple Vitamin (DAILY-VITE) TABS Take 1 tablet by mouth daily.    . potassium chloride SA (K-DUR) 10 MEQ tablet Take 1 tablet (10 mEq total) by mouth daily.    . rosuvastatin (CRESTOR) 5 MG tablet Take 5 mg by mouth daily.    . sacubitril-valsartan (ENTRESTO) 24-26 MG Take 1 tablet by mouth 2 (two) times daily. 60 tablet 1  . warfarin (COUMADIN) 1 MG tablet      No current facility-administered medications for this visit.   Allergies:  Benadryl [diphenhydramine hcl (sleep)], Hand sanitizer [ethyl alcohol (skin cleanser)], Adhesive [tape], Other, Penicillins, Soap, and Streptomycin sulfate [streptomycin]   ROS: No syncope.  Physical Exam: VS:  BP (!) 142/70  Pulse 63   Ht 5' (1.524 m)   Wt 146 lb (66.2 kg)   SpO2 (!) 88%   BMI 28.51 kg/m , BMI Body mass index is 28.51 kg/m.  Wt Readings from Last 3 Encounters:  08/16/20 146 lb (66.2 kg)  03/27/20 148 lb (67.1 kg)  02/24/20 149 lb (67.6 kg)    General: Elderly woman, seated in wheelchair. HEENT: Conjunctiva and lids normal, wearing a mask. Neck: Supple, no elevated JVP or carotid bruits. Lungs: Clear to auscultation, nonlabored breathing at rest. Cardiac: Irregularly irregular, no S3 or significant systolic murmur, no pericardial rub. Extremities: Stable appearing edema, venous stasis.  ECG:  An ECG dated 05/28/2019 was personally reviewed today and demonstrated:  Rate  controlled atrial fibrillation with decreased R wave progression and nonspecific T wave changes.  Recent Labwork:    Component Value Date/Time   CHOL 90 07/26/2019 0620   TRIG 71 07/26/2019 0620   HDL 27 (L) 07/26/2019 0620   CHOLHDL 3.3 07/26/2019 0620   VLDL 14 07/26/2019 0620   LDLCALC 49 07/26/2019 0620  March 2021: Hemoglobin 10.2, platelets 163, BUN 39, creatinine 1.34, potassium 4.1, AST 20, ALT 11,  February 2021: Cholesterol 106, triglycerides 70, HDL 45, LDL 47  Other Studies Reviewed Today:  Echocardiogram 07/25/2019: 1. The left ventricle has mild-moderately reduced systolic function, with  an ejection fraction of 40-45%. The cavity size was normal. Left  ventricular diastolic Doppler parameters are indeterminate. Left  ventricular diffuse hypokinesis.  2. Normal RV size with mildly decreased systolic function.  3. There is mild mitral annular calcification present. Mitral valve  regurgitation is mild to moderate by color flow Doppler. No evidence of  mitral valve stenosis.  4. The aortic valve is tricuspid. Mild calcification of the aortic valve.  No stenosis of the aortic valve.  5. The aortic root is normal in size and structure.  6. Left atrial size was severely dilated.  7. Right atrial size was mildly dilated.  8. The inferior vena cava was dilated in size with <50% respiratory  variability. PA systolic pressure 44 mmHg.   Cardiac catheterization 07/28/2019:  Ost LAD to Prox LAD lesion is 50% stenosed.  Prox LAD to Mid LAD lesion is 80% stenosed.  1st Sept lesion is 20% stenosed.  Prox Cx lesion is 80% stenosed.   Multivessel CAD with 50% ostial LAD stenosis followed by diffuse 80% proximal to mid LAD stenoses; 80% proximal circumflex stenoses, and a dominant RCA which gives rise to a conus branch that has an 85% ostial conus branch stenosis, and otherwise no significant obstruction in the RCA which supplies a large PDA and PLA vessel.  Mild right  heart pressure elevation with PA systolic pressure at 39 mm.  Assessment and Plan:  1.  Multivessel CAD as noted above, medically managed and without reported angina symptoms.  She is not on aspirin given use of chronic Coumadin.  Continue Coreg and Crestor.  2.  Secondary cardiomyopathy with LVEF 40 to 45% and chronic systolic heart failure.  She is clinically stable, no significant increase in weight.  Continue Entresto, Coreg, and Lasix with potassium supplement.  3.  Permanent atrial fibrillation as well as protein S deficiency, on chronic Coumadin with follow-up by Dr. Sherril Croon.  Heart rate is controlled today on Coreg.  Medication Adjustments/Labs and Tests Ordered: Current medicines are reviewed at length with the patient today.  Concerns regarding medicines are outlined above.   Tests Ordered: Orders Placed This Encounter  Procedures  .  EKG 12-Lead    Medication Changes: No orders of the defined types were placed in this encounter.   Disposition:  Follow up 6 months in the [place] office.  Signed, Jonelle Sidle, MD, [facility] 08/16/2020 11:57 AM    [facility] at [facility] [address] [place], [place], [place] [address] Phone: [phone]; Fax: [phone]

## 2020-08-16 NOTE — Patient Instructions (Signed)

## 2020-08-29 ENCOUNTER — Other Ambulatory Visit: Payer: Self-pay | Admitting: Family Medicine

## 2020-09-05 DIAGNOSIS — I10 Essential (primary) hypertension: Secondary | ICD-10-CM | POA: Diagnosis not present

## 2020-09-05 DIAGNOSIS — I2699 Other pulmonary embolism without acute cor pulmonale: Secondary | ICD-10-CM | POA: Diagnosis not present

## 2020-09-05 DIAGNOSIS — I5020 Unspecified systolic (congestive) heart failure: Secondary | ICD-10-CM | POA: Diagnosis not present

## 2020-09-05 DIAGNOSIS — Z299 Encounter for prophylactic measures, unspecified: Secondary | ICD-10-CM | POA: Diagnosis not present

## 2020-09-07 NOTE — Progress Notes (Signed)
Office Visit Note  Patient: Rebecca Hanson             Date of Birth: 24-Jan-1932           MRN: [id number]             PCP: Ignatius Specking, MD Referring: Ignatius Specking, MD Visit Date: 09/20/2020 Occupation: @GUAROCC @  Caregiver - Renee Caulder  Subjective: Pain all over the body.   History of Present Illness: Rebecca Hanson is a 86 y.o. female with past history of polymyalgia rheumatica and chondrocalcinosis.  She also has severe osteoarthritis involving multiple joints.  She was accompanied by her caregiver today.  According to the patient and her caregiver she is in constant pain in all of her joints.  She states the Tylenol is not effective.  She has a prescription of tramadol by Dr. 88 which she has not taken in a long time as she did not find it effective.  Activities of Daily Living:  Patient reports morning stiffness for 24 hours.   Patient Reports nocturnal pain.  Difficulty dressing/grooming: Reports Difficulty climbing stairs: Reports Difficulty getting out of chair: Reports Difficulty using hands for taps, buttons, cutlery, and/or writing: Reports  Review of Systems  Constitutional: Positive for fatigue.  HENT: Positive for mouth dryness. Negative for mouth sores and nose dryness.   Eyes: Positive for itching. Negative for pain and dryness.  Respiratory: Negative for cough, hemoptysis, shortness of breath and difficulty breathing.   Cardiovascular: Positive for swelling in legs/feet.  Gastrointestinal: Positive for diarrhea. Negative for abdominal pain, blood in stool and constipation.  Endocrine: Negative for increased urination.  Genitourinary: Negative for painful urination.  Musculoskeletal: Positive for arthralgias, joint pain, myalgias, muscle weakness, morning stiffness, muscle tenderness and myalgias.  Skin: Negative for color change, rash and redness.  Allergic/Immunologic: Negative for susceptible to infections.  Neurological: Positive for dizziness  and weakness. Negative for numbness and memory loss.  Hematological: Positive for bruising/bleeding tendency. Negative for swollen glands.  Psychiatric/Behavioral: Positive for sleep disturbance. Negative for confusion.    PMFS History:  Patient Active Problem List   Diagnosis Date Noted  . Anemia of chronic disease 03/27/2020  . Esophageal dysphagia 03/27/2020  . Normocytic anemia 08/24/2019  . Loss of weight 08/24/2019  . Elevated troponin 07/25/2019  . Chest pain 07/25/2019  . Dyspnea 07/25/2019  . SOB (shortness of breath)   . DCM (dilated cardiomyopathy) (HCC)   . Nonrheumatic mitral valve regurgitation   . Polymyalgia rheumatica (HCC) 11/19/2016  . Primary osteoarthritis of both knees 11/19/2016  . Primary osteoarthritis of both hands 11/19/2016  . Pseudogout 11/19/2016  . Essential hypertension 11/19/2016  . Diabetes mellitus 11/19/2016  . History of DVT (deep vein thrombosis) 11/19/2016  . History of pulmonary embolism 11/19/2016  . History of congestive heart failure 11/19/2016    Past Medical History:  Diagnosis Date  . Atrial fibrillation (HCC)   . Cardiomyopathy (HCC)   . CKD (chronic kidney disease), stage III (HCC)   . Essential hypertension   . Hyperlipidemia   . Polymyalgia rheumatica (HCC)   . Protein S deficiency (HCC)   . Type 2 diabetes mellitus (HCC)     Family History  Problem Relation Age of Onset  . Heart disease Father   . CAD Father   . CVA Father   . Colon polyps Father        > 34 years old   . Colon polyps Sister        >  21 years old   . Rheum arthritis Sister   . Atrial fibrillation Sister   . Colon cancer Neg Hx    Past Surgical History:  Procedure Laterality Date  . BREAST BIOPSY    . CARDIAC CATHETERIZATION    . CATARACT EXTRACTION    . RIGHT/LEFT HEART CATH AND CORONARY ANGIOGRAPHY N/A 07/28/2019   Procedure: RIGHT/LEFT HEART CATH AND CORONARY ANGIOGRAPHY;  Surgeon: Lennette Bihari, MD;  [facility];   Service: Cardiovascular;  Laterality: N/A;   Social History   Social History Narrative  . Not on file   Immunization History  Administered Date(s) Administered  . Fluad Quad(high Dose 65+) 07/26/2019  . Moderna SARS-COVID-2 Vaccination 12/15/2019, 01/12/2020     Objective: Vital Signs: BP (!) 141/82 (BP Location: Right Arm, Patient Position: Sitting, Cuff Size: Small)   Pulse 64   Ht 5' (1.524 m)   Wt 143 lb (64.9 kg)   BMI 27.93 kg/m    Physical Exam Vitals and nursing note reviewed.  Constitutional:      Appearance: She is well-developed.  HENT:     Head: Normocephalic and atraumatic.  Eyes:     Conjunctiva/sclera: Conjunctivae normal.  Cardiovascular:     Rate and Rhythm: Normal rate and regular rhythm.     Heart sounds: Normal heart sounds.  Pulmonary:     Effort: Pulmonary effort is normal.     Breath sounds: Normal breath sounds.  Abdominal:     General: Bowel sounds are normal.     Palpations: Abdomen is soft.  Musculoskeletal:     Cervical back: Normal range of motion.     Right lower leg: Edema present.     Left lower leg: Edema present.  Lymphadenopathy:     Cervical: No cervical adenopathy.  Skin:    General: Skin is warm and dry.     Capillary Refill: Capillary refill takes less than 2 seconds.  Neurological:     Mental Status: She is alert and oriented to person, place, and time.  Psychiatric:        Behavior: Behavior normal.      Musculoskeletal Exam: She had good lateral rotation in her cervical spine.  Shoulder joints with good range of motion without any discomfort.  Elbow joints and wrist joints with good range of motion.  She has bilateral CMC PIP and DIP thickening with no synovitis.  Hip joints were difficult to assess in the wheelchair.  She had good range of motion of bilateral knee joints without any swelling.  CDAI Exam: CDAI Score: -- Patient Global: --; Provider Global: -- Swollen: --; Tender: -- Joint Exam 09/20/2020   No  joint exam has been documented for this visit   There is currently no information documented on the homunculus. Go to the Rheumatology activity and complete the homunculus joint exam.  Investigation: No additional findings.  Imaging: No results found.  Recent Labs: Lab Results  Component Value Date   WBC 7.4 07/29/2019   HGB 9.3 (L) 07/29/2019   PLT 191 07/29/2019   NA 137 07/29/2019   K 4.8 07/29/2019   CL 106 07/29/2019   CO2 24 07/29/2019   GLUCOSE 148 (H) 07/29/2019   BUN 23 07/29/2019   CREATININE 1.06 (H) 07/29/2019   BILITOT 0.6 07/24/2019   ALKPHOS 49 07/24/2019   AST 26 07/24/2019   ALT 12 07/24/2019   PROT 5.4 (L) 07/24/2019   ALBUMIN 2.7 (L) 07/24/2019   CALCIUM 8.8 (L) 07/29/2019  GFRAA 55 (L) 07/29/2019    Speciality Comments: No specialty comments available.  Procedures:  No procedures performed Allergies: Benadryl [diphenhydramine hcl (sleep)], Hand sanitizer [ethyl alcohol (skin cleanser)], Adhesive [tape], Other, Penicillins, Soap, and Streptomycin sulfate [streptomycin]   Assessment / Plan:     Visit Diagnoses: Polymyalgia rheumatica (HCC) -she has not had a flare of polymyalgia rheumatica in a long time.  She had no increased muscle weakness or tenderness on my examination today.  I will check sedimentation rate again.  Plan: Sedimentation rate.  We will contact her after the sed rate results are available.  Primary osteoarthritis of both hands-she has severe osteoarthritis involving her hands and ongoing discomfort.  Primary osteoarthritis of both knees-she has osteoarthritis involving her bilateral knee joints which causes discomfort.  She states that she is in constant pain.  She may benefit from referral to pain clinic.  She has not tried tramadol in a long time.  She has tramadol at home.  Have advised her to try tramadol.  If it is ineffective then she can get a referral to pain clinic.  Pseudogout-she has not had an episode of pseudogout flare.   She had no synovitis on my examination today.  History of DVT (deep vein thrombosis)  History of congestive heart failure  History of hypertension  History of pulmonary embolism  History of diabetes mellitus  Orders: Orders Placed This Encounter  Procedures  . Sedimentation rate   No orders of the defined types were placed in this encounter.    Follow-Up Instructions: Return in about 1 year (around 09/20/2021), or if symptoms worsen or fail to improve, for Osteoarthritis.   Pollyann Savoy, MD  Note - This record has been created using Animal nutritionist.  Chart creation errors have been sought, but may not always  have been located. Such creation errors do not reflect on  the standard of medical care.

## 2020-09-20 ENCOUNTER — Ambulatory Visit (INDEPENDENT_AMBULATORY_CARE_PROVIDER_SITE_OTHER): Payer: Medicare Other | Admitting: Rheumatology

## 2020-09-20 ENCOUNTER — Encounter: Payer: Self-pay | Admitting: Rheumatology

## 2020-09-20 ENCOUNTER — Other Ambulatory Visit: Payer: Self-pay

## 2020-09-20 VITALS — BP 141/82 | HR 64 | Ht 60.0 in | Wt 143.0 lb

## 2020-09-20 DIAGNOSIS — M19042 Primary osteoarthritis, left hand: Secondary | ICD-10-CM | POA: Diagnosis not present

## 2020-09-20 DIAGNOSIS — M1120 Other chondrocalcinosis, unspecified site: Secondary | ICD-10-CM

## 2020-09-20 DIAGNOSIS — Z86711 Personal history of pulmonary embolism: Secondary | ICD-10-CM

## 2020-09-20 DIAGNOSIS — Z86718 Personal history of other venous thrombosis and embolism: Secondary | ICD-10-CM

## 2020-09-20 DIAGNOSIS — Z8639 Personal history of other endocrine, nutritional and metabolic disease: Secondary | ICD-10-CM

## 2020-09-20 DIAGNOSIS — Z8679 Personal history of other diseases of the circulatory system: Secondary | ICD-10-CM | POA: Diagnosis not present

## 2020-09-20 DIAGNOSIS — I25119 Atherosclerotic heart disease of native coronary artery with unspecified angina pectoris: Secondary | ICD-10-CM | POA: Diagnosis not present

## 2020-09-20 DIAGNOSIS — M353 Polymyalgia rheumatica: Secondary | ICD-10-CM | POA: Diagnosis not present

## 2020-09-20 DIAGNOSIS — M170 Bilateral primary osteoarthritis of knee: Secondary | ICD-10-CM

## 2020-09-20 DIAGNOSIS — M19041 Primary osteoarthritis, right hand: Secondary | ICD-10-CM | POA: Diagnosis not present

## 2020-09-20 LAB — SEDIMENTATION RATE: Sed Rate: 9 mm/h (ref 0–30)

## 2020-09-21 NOTE — Progress Notes (Signed)
Sed rate is normal

## 2020-10-16 DIAGNOSIS — I2699 Other pulmonary embolism without acute cor pulmonale: Secondary | ICD-10-CM | POA: Diagnosis not present

## 2020-10-16 DIAGNOSIS — I10 Essential (primary) hypertension: Secondary | ICD-10-CM | POA: Diagnosis not present

## 2020-10-16 DIAGNOSIS — Z299 Encounter for prophylactic measures, unspecified: Secondary | ICD-10-CM | POA: Diagnosis not present

## 2020-10-16 DIAGNOSIS — Z23 Encounter for immunization: Secondary | ICD-10-CM | POA: Diagnosis not present

## 2020-10-16 DIAGNOSIS — I509 Heart failure, unspecified: Secondary | ICD-10-CM | POA: Diagnosis not present

## 2020-10-29 IMAGING — RF DG ESOPHAGUS
10 of 11 series · 14 of 24 positions shown · non-contrast
Comparison: None

CLINICAL DATA: Esophageal dysphagia, intermittently as lump in
throat while eating which does not resolve until she vomits

EXAM:
ESOPHOGRAM/BARIUM SWALLOW
TECHNIQUE: Single contrast examination was performed using  thin barium.
FLUOROSCOPY TIME:  Fluoroscopy Time:  2 minutes 36 seconds
Radiation Exposure Index (if provided by the fluoroscopic device):
40.2 mGy
Number of Acquired Spot Images: multiple fluoroscopic screen
captures

[Series 1: cp_standard · 0.19mm/px · 2 of 16 frames shown (1 of 10)]
[frame 3/16]
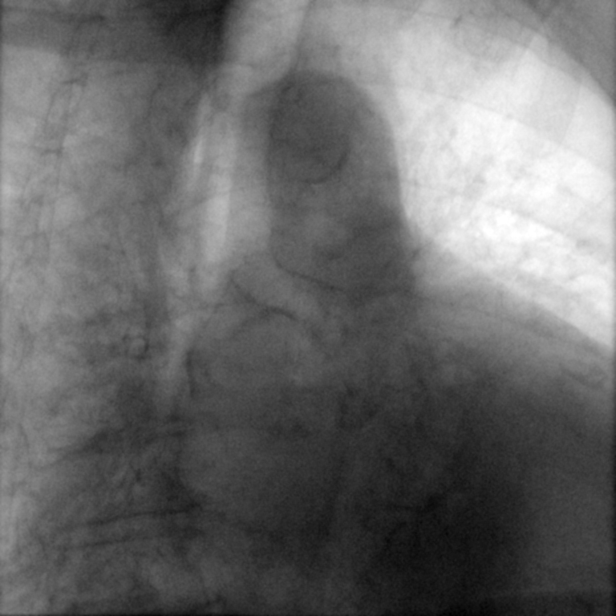
[frame 14/16]
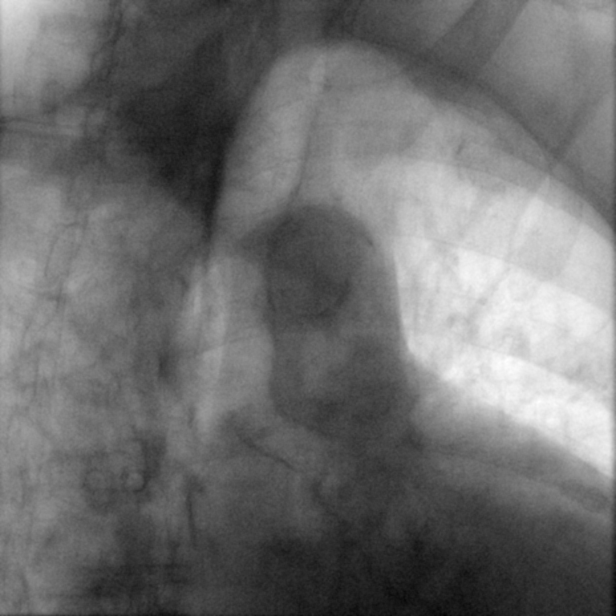

[Series 2: cp_standard · 0.19mm/px · 1 of 100 frames shown (2 of 10)]
[frame 86/100]
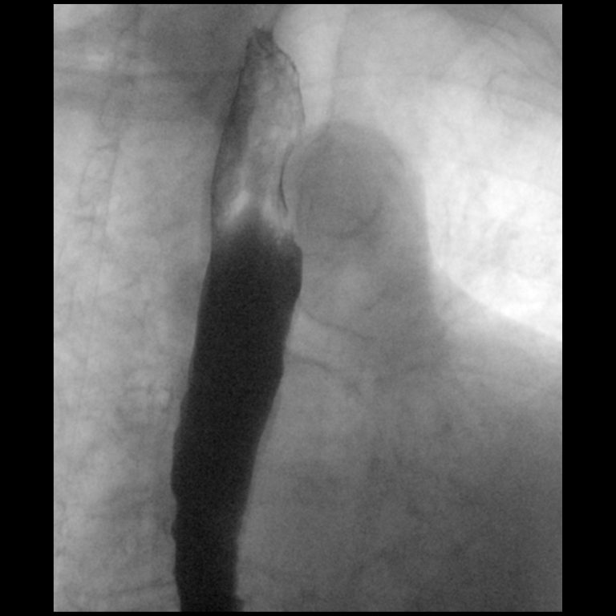

[Series 3: cp_standard · 0.19mm/px · 1 of 68 frames shown (3 of 10)]
[frame 35/68]
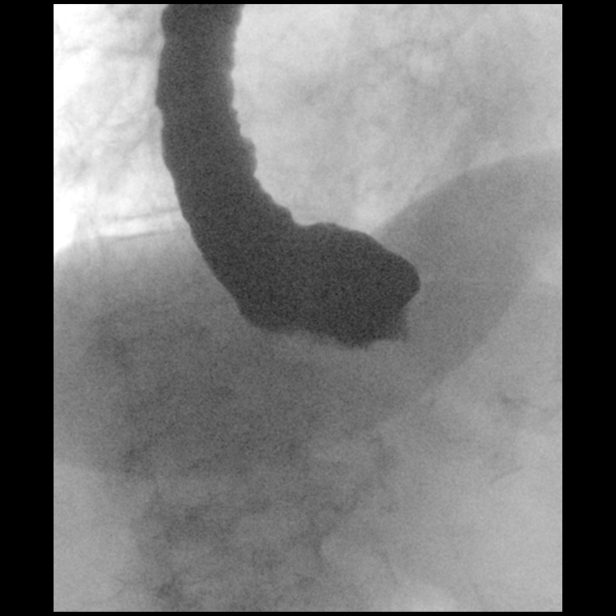

[Series 4: cp_standard · 0.19mm/px · 2 of 88 frames shown (4 of 10)]
[frame 14/88]
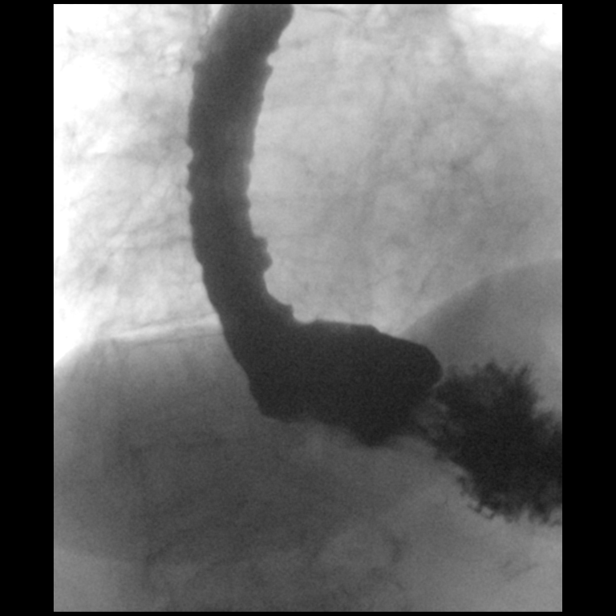
[frame 88/88]
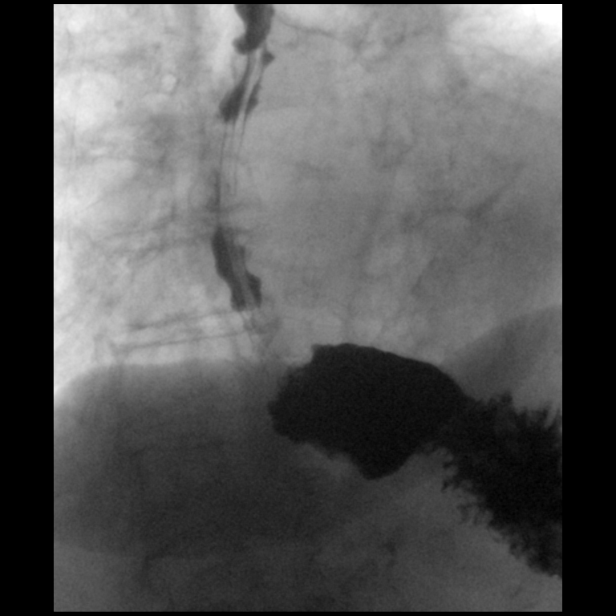

[Series 5: cp_standard · 0.19mm/px · 1 of 61 frames shown (5 of 10)]
[frame 31/61]
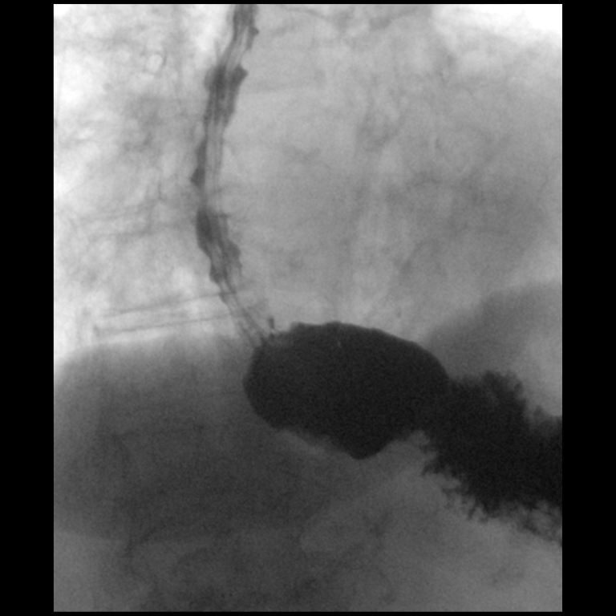

[Series 6: cp_standard · 0.19mm/px · 1 of 1 slices shown (6 of 10)]
[im 1/1]
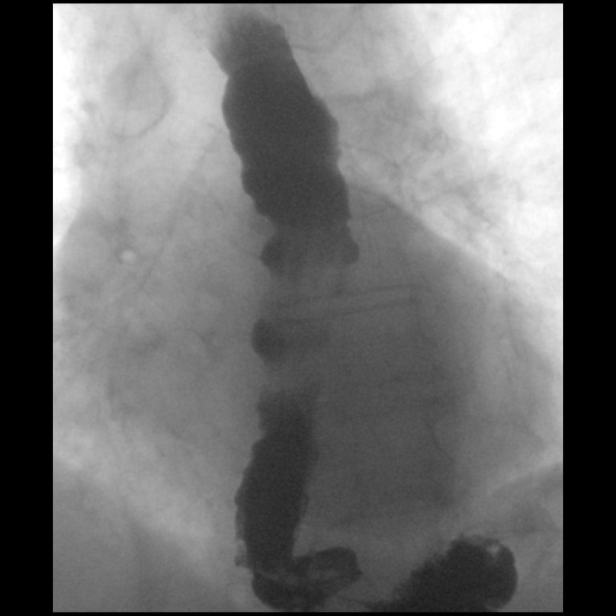

[Series 8: cp_standard · 0.19mm/px · 1 of 185 frames shown (7 of 10)]
[frame 75/185]
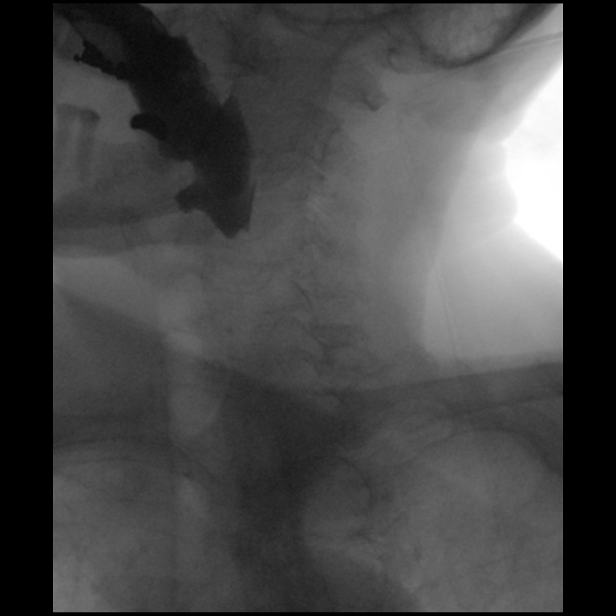

[Series 9: cp_standard · 0.19mm/px · 1 of 20 frames shown (8 of 10)]
[frame 1/20]
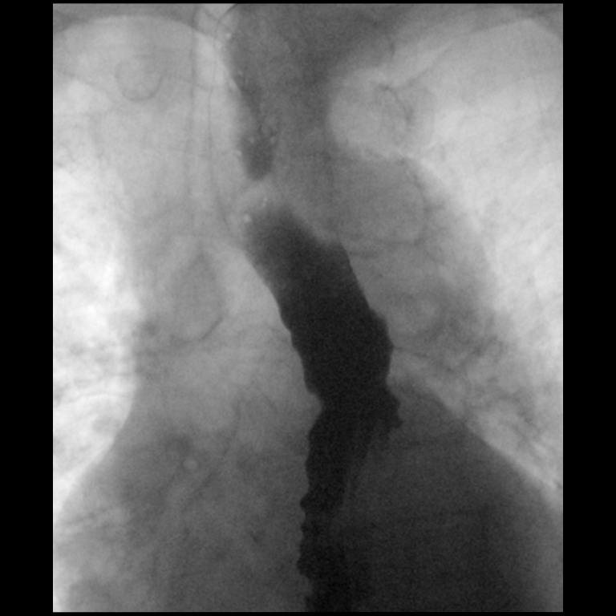

[Series 10: cp_standard · 0.19mm/px · 2 of 84 frames shown (9 of 10)]
[frame 10/84]
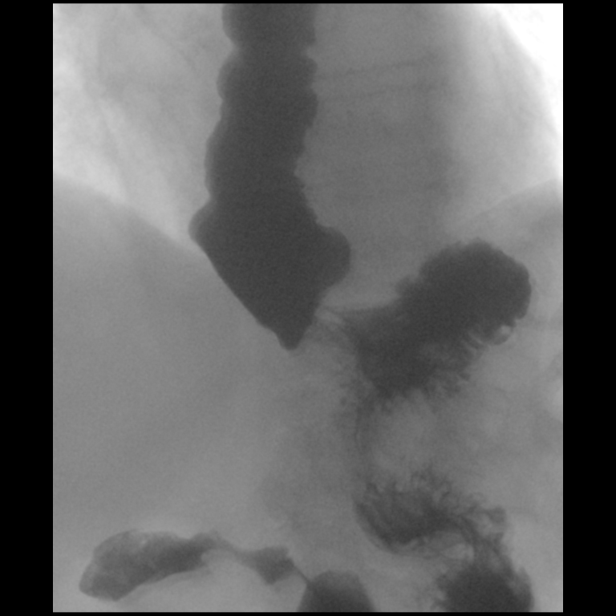
[frame 13/84]
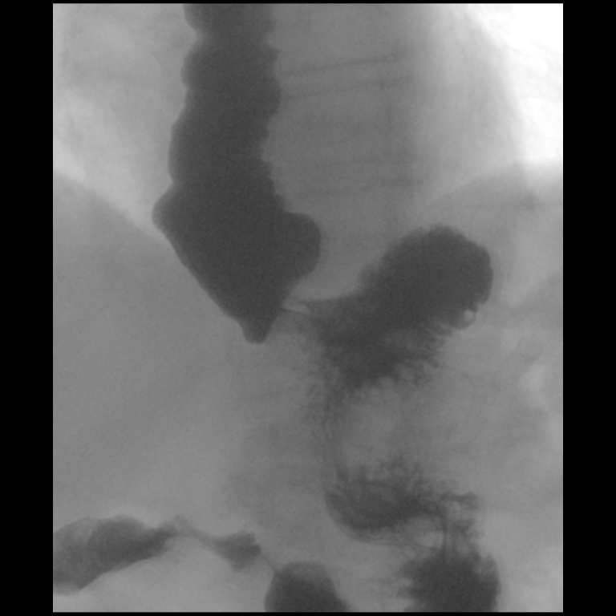

[Series 12: cp_standard · 0.27mm/px · 2 of 211 frames shown (10 of 10)]
[frame 32/211]
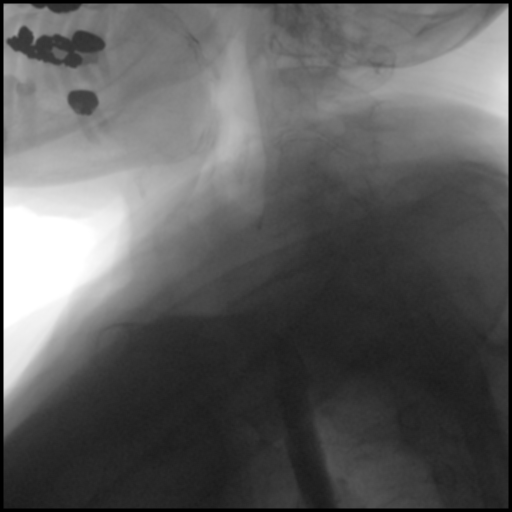
[frame 180/211]
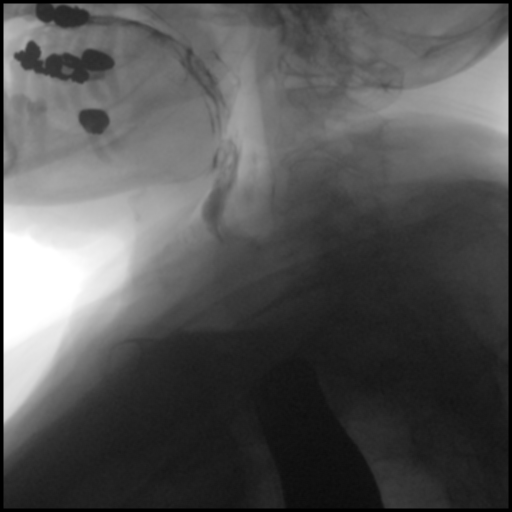

[14 of 24 positions shown; findings below may reference images not displayed]

FINDINGS: Patient unable to stand due to weakness, so study performed with
patient LPO and RPO with head of bed elevated 30 degrees; as result,
unable to perform adequate air-contrast esophagram and a
single-contrast exam was performed.

Esophageal distention: Normal without mass or stricture

Filling defects:  None

12.5 mm barium tablet: Passed from oral cavity to the distal
esophagus without difficulty. Did not pass into the stomach, though
never approached the GE junction due to severe degree of esophageal
dysmotility.

Motility: Severe esophageal dysmotility with incomplete clearance of
barium by primary peristaltic waves and presence of numerous
secondary and tertiary waves. Intermittent retrograde peristalsis
identified.

Mucosa:  No definite esophageal mucosal irregularity

Hypopharynx/cervical esophagus: No laryngeal penetration, aspiration
or significant residuals

Hiatal hernia:  Absent

GE reflux:  Not witnessed during exam

Other:  Atherosclerotic calcification aorta.
IMPRESSION: Significant esophageal dysmotility.

12.5 mm diameter barium tablet passed or oral cavity 2 distal
esophagus but due to dysmotility was not propelled to the
gastroesophageal junction despite multiple swallows of water and
thin barium; therefore unable to assess GE junction diameter.

Aortic Atherosclerosis ([id number]).

## 2020-10-30 DIAGNOSIS — Z299 Encounter for prophylactic measures, unspecified: Secondary | ICD-10-CM | POA: Diagnosis not present

## 2020-10-30 DIAGNOSIS — J209 Acute bronchitis, unspecified: Secondary | ICD-10-CM | POA: Diagnosis not present

## 2020-11-01 DIAGNOSIS — R059 Cough, unspecified: Secondary | ICD-10-CM | POA: Diagnosis not present

## 2020-11-01 DIAGNOSIS — I517 Cardiomegaly: Secondary | ICD-10-CM | POA: Diagnosis not present

## 2020-11-13 DIAGNOSIS — E1165 Type 2 diabetes mellitus with hyperglycemia: Secondary | ICD-10-CM | POA: Diagnosis not present

## 2020-11-13 DIAGNOSIS — Z299 Encounter for prophylactic measures, unspecified: Secondary | ICD-10-CM | POA: Diagnosis not present

## 2020-11-13 DIAGNOSIS — E7800 Pure hypercholesterolemia, unspecified: Secondary | ICD-10-CM | POA: Diagnosis not present

## 2020-11-13 DIAGNOSIS — I5020 Unspecified systolic (congestive) heart failure: Secondary | ICD-10-CM | POA: Diagnosis not present

## 2020-11-13 DIAGNOSIS — I2699 Other pulmonary embolism without acute cor pulmonale: Secondary | ICD-10-CM | POA: Diagnosis not present

## 2020-12-11 ENCOUNTER — Telehealth: Payer: Self-pay | Admitting: Family Medicine

## 2020-12-11 NOTE — Telephone Encounter (Signed)
Pt c/o medication issue:  1. Name of Medication: Entresto  4. What is your medication issue? Too expensive for the patient $523.00.   Marchelle Folks at [facility] called concerned because the patient can not afford her medication. She wants to know if Rennis Harding can change her back to Lisinopril.

## 2020-12-12 ENCOUNTER — Encounter: Payer: Self-pay | Admitting: *Deleted

## 2020-12-12 ENCOUNTER — Other Ambulatory Visit: Payer: Self-pay | Admitting: *Deleted

## 2020-12-12 DIAGNOSIS — I10 Essential (primary) hypertension: Secondary | ICD-10-CM | POA: Diagnosis not present

## 2020-12-12 DIAGNOSIS — Z299 Encounter for prophylactic measures, unspecified: Secondary | ICD-10-CM | POA: Diagnosis not present

## 2020-12-12 DIAGNOSIS — N390 Urinary tract infection, site not specified: Secondary | ICD-10-CM | POA: Diagnosis not present

## 2020-12-12 DIAGNOSIS — I429 Cardiomyopathy, unspecified: Secondary | ICD-10-CM | POA: Diagnosis not present

## 2020-12-12 DIAGNOSIS — D6869 Other thrombophilia: Secondary | ICD-10-CM | POA: Diagnosis not present

## 2020-12-12 DIAGNOSIS — I5022 Chronic systolic (congestive) heart failure: Secondary | ICD-10-CM | POA: Insufficient documentation

## 2020-12-12 MED ORDER — ENTRESTO 24-26 MG PO TABS: 1.0000 | ORAL_TABLET | Freq: Two times a day (BID) | ORAL | 2 refills | Status: AC

## 2020-12-12 NOTE — Telephone Encounter (Signed)
Sister [place] informed and will come by office to sign patient assistance application and bring proof of income.

## 2020-12-12 NOTE — Telephone Encounter (Signed)
Spoke with caregiver who is not listed on DPR and was given [place] (sister) contact to call to discuss patient assistance [phone]. LMTC

## 2020-12-12 NOTE — Telephone Encounter (Signed)
[place] pharmacist informed that patient assistance would be initiated and patient would be contacted  [employer] [id number] PCN PartD ID# [id number] Group [id number] Help Desk number [phone]

## 2020-12-18 DIAGNOSIS — I2699 Other pulmonary embolism without acute cor pulmonale: Secondary | ICD-10-CM | POA: Diagnosis not present

## 2020-12-18 DIAGNOSIS — I10 Essential (primary) hypertension: Secondary | ICD-10-CM | POA: Diagnosis not present

## 2020-12-18 DIAGNOSIS — Z299 Encounter for prophylactic measures, unspecified: Secondary | ICD-10-CM | POA: Diagnosis not present

## 2020-12-18 DIAGNOSIS — I509 Heart failure, unspecified: Secondary | ICD-10-CM | POA: Diagnosis not present

## 2020-12-19 DIAGNOSIS — I2699 Other pulmonary embolism without acute cor pulmonale: Secondary | ICD-10-CM | POA: Diagnosis not present

## 2020-12-19 DIAGNOSIS — R569 Unspecified convulsions: Secondary | ICD-10-CM | POA: Diagnosis not present

## 2020-12-19 DIAGNOSIS — E785 Hyperlipidemia, unspecified: Secondary | ICD-10-CM | POA: Diagnosis not present

## 2020-12-19 DIAGNOSIS — I4891 Unspecified atrial fibrillation: Secondary | ICD-10-CM | POA: Diagnosis not present

## 2020-12-19 DIAGNOSIS — M353 Polymyalgia rheumatica: Secondary | ICD-10-CM | POA: Diagnosis not present

## 2020-12-19 DIAGNOSIS — M1990 Unspecified osteoarthritis, unspecified site: Secondary | ICD-10-CM | POA: Diagnosis not present

## 2020-12-19 DIAGNOSIS — D530 Protein deficiency anemia: Secondary | ICD-10-CM | POA: Diagnosis not present

## 2020-12-19 DIAGNOSIS — E1165 Type 2 diabetes mellitus with hyperglycemia: Secondary | ICD-10-CM | POA: Diagnosis not present

## 2020-12-19 DIAGNOSIS — I509 Heart failure, unspecified: Secondary | ICD-10-CM | POA: Diagnosis not present

## 2020-12-19 DIAGNOSIS — N183 Chronic kidney disease, stage 3 unspecified: Secondary | ICD-10-CM | POA: Diagnosis not present

## 2020-12-19 DIAGNOSIS — D649 Anemia, unspecified: Secondary | ICD-10-CM | POA: Diagnosis not present

## 2020-12-19 DIAGNOSIS — I10 Essential (primary) hypertension: Secondary | ICD-10-CM | POA: Diagnosis not present

## 2020-12-19 DIAGNOSIS — R404 Transient alteration of awareness: Secondary | ICD-10-CM | POA: Diagnosis not present

## 2020-12-19 DIAGNOSIS — R000 Tachycardia, unspecified: Secondary | ICD-10-CM | POA: Diagnosis not present

## 2020-12-19 DIAGNOSIS — E119 Type 2 diabetes mellitus without complications: Secondary | ICD-10-CM | POA: Diagnosis not present

## 2020-12-19 DIAGNOSIS — I82402 Acute embolism and thrombosis of unspecified deep veins of left lower extremity: Secondary | ICD-10-CM | POA: Diagnosis not present

## 2020-12-19 DIAGNOSIS — R0902 Hypoxemia: Secondary | ICD-10-CM | POA: Diagnosis not present

## 2020-12-20 DIAGNOSIS — D649 Anemia, unspecified: Secondary | ICD-10-CM | POA: Diagnosis not present

## 2020-12-20 DIAGNOSIS — I509 Heart failure, unspecified: Secondary | ICD-10-CM | POA: Diagnosis not present

## 2020-12-20 DIAGNOSIS — I10 Essential (primary) hypertension: Secondary | ICD-10-CM | POA: Diagnosis not present

## 2020-12-20 DIAGNOSIS — M1990 Unspecified osteoarthritis, unspecified site: Secondary | ICD-10-CM | POA: Diagnosis not present

## 2020-12-20 DIAGNOSIS — M353 Polymyalgia rheumatica: Secondary | ICD-10-CM | POA: Diagnosis not present

## 2020-12-20 DIAGNOSIS — E119 Type 2 diabetes mellitus without complications: Secondary | ICD-10-CM | POA: Diagnosis not present

## 2020-12-21 DIAGNOSIS — M1990 Unspecified osteoarthritis, unspecified site: Secondary | ICD-10-CM | POA: Diagnosis not present

## 2020-12-21 DIAGNOSIS — I10 Essential (primary) hypertension: Secondary | ICD-10-CM | POA: Diagnosis not present

## 2020-12-21 DIAGNOSIS — E119 Type 2 diabetes mellitus without complications: Secondary | ICD-10-CM | POA: Diagnosis not present

## 2020-12-21 DIAGNOSIS — D649 Anemia, unspecified: Secondary | ICD-10-CM | POA: Diagnosis not present

## 2020-12-21 DIAGNOSIS — I509 Heart failure, unspecified: Secondary | ICD-10-CM | POA: Diagnosis not present

## 2020-12-21 DIAGNOSIS — M353 Polymyalgia rheumatica: Secondary | ICD-10-CM | POA: Diagnosis not present

## 2020-12-22 DIAGNOSIS — M353 Polymyalgia rheumatica: Secondary | ICD-10-CM | POA: Diagnosis not present

## 2020-12-22 DIAGNOSIS — I509 Heart failure, unspecified: Secondary | ICD-10-CM | POA: Diagnosis not present

## 2020-12-22 DIAGNOSIS — I10 Essential (primary) hypertension: Secondary | ICD-10-CM | POA: Diagnosis not present

## 2020-12-22 DIAGNOSIS — D649 Anemia, unspecified: Secondary | ICD-10-CM | POA: Diagnosis not present

## 2020-12-22 DIAGNOSIS — M1990 Unspecified osteoarthritis, unspecified site: Secondary | ICD-10-CM | POA: Diagnosis not present

## 2020-12-22 DIAGNOSIS — E119 Type 2 diabetes mellitus without complications: Secondary | ICD-10-CM | POA: Diagnosis not present

## 2020-12-23 DIAGNOSIS — M353 Polymyalgia rheumatica: Secondary | ICD-10-CM | POA: Diagnosis not present

## 2020-12-23 DIAGNOSIS — E119 Type 2 diabetes mellitus without complications: Secondary | ICD-10-CM | POA: Diagnosis not present

## 2020-12-23 DIAGNOSIS — I509 Heart failure, unspecified: Secondary | ICD-10-CM | POA: Diagnosis not present

## 2020-12-23 DIAGNOSIS — M1990 Unspecified osteoarthritis, unspecified site: Secondary | ICD-10-CM | POA: Diagnosis not present

## 2020-12-23 DIAGNOSIS — I10 Essential (primary) hypertension: Secondary | ICD-10-CM | POA: Diagnosis not present

## 2020-12-23 DIAGNOSIS — D649 Anemia, unspecified: Secondary | ICD-10-CM | POA: Diagnosis not present

## 2021-01-08 DEATH — deceased

## 2021-09-19 ENCOUNTER — Ambulatory Visit: Payer: Medicare Other | Admitting: Physician Assistant
